# Patient Record
Sex: Female | Born: 1935 | Race: White | Hispanic: No | Marital: Married | Smoking: Never smoker
Health system: Southern US, Community
[De-identification: ages and names within clinical notes are randomized; demographics above are authoritative.]

## PROBLEM LIST (undated history)

## (undated) DIAGNOSIS — R42 Dizziness and giddiness: Secondary | ICD-10-CM

## (undated) DIAGNOSIS — I951 Orthostatic hypotension: Secondary | ICD-10-CM

## (undated) DIAGNOSIS — E872 Acidosis, unspecified: Secondary | ICD-10-CM

## (undated) DIAGNOSIS — M1990 Unspecified osteoarthritis, unspecified site: Secondary | ICD-10-CM

## (undated) DIAGNOSIS — N189 Chronic kidney disease, unspecified: Secondary | ICD-10-CM

## (undated) DIAGNOSIS — N9489 Other specified conditions associated with female genital organs and menstrual cycle: Secondary | ICD-10-CM

## (undated) DIAGNOSIS — M810 Age-related osteoporosis without current pathological fracture: Secondary | ICD-10-CM

## (undated) DIAGNOSIS — R001 Bradycardia, unspecified: Secondary | ICD-10-CM

## (undated) DIAGNOSIS — D649 Anemia, unspecified: Secondary | ICD-10-CM

## (undated) DIAGNOSIS — J302 Other seasonal allergic rhinitis: Secondary | ICD-10-CM

## (undated) DIAGNOSIS — H3530 Unspecified macular degeneration: Secondary | ICD-10-CM

## (undated) HISTORY — PX: ABDOMINAL HYSTERECTOMY: SHX81

---

## 2004-06-23 ENCOUNTER — Ambulatory Visit: Payer: Self-pay | Admitting: Family Medicine

## 2005-07-08 ENCOUNTER — Ambulatory Visit: Payer: Self-pay | Admitting: Family Medicine

## 2006-08-02 ENCOUNTER — Ambulatory Visit: Payer: Self-pay | Admitting: Family Medicine

## 2007-10-23 ENCOUNTER — Ambulatory Visit: Payer: Self-pay | Admitting: Family Medicine

## 2008-10-31 ENCOUNTER — Ambulatory Visit: Payer: Self-pay | Admitting: Family Medicine

## 2009-11-19 ENCOUNTER — Ambulatory Visit: Payer: Self-pay | Admitting: Family Medicine

## 2012-03-30 ENCOUNTER — Ambulatory Visit: Payer: Self-pay | Admitting: Family Medicine

## 2013-04-18 ENCOUNTER — Emergency Department: Payer: Self-pay | Admitting: Emergency Medicine

## 2014-07-15 ENCOUNTER — Ambulatory Visit: Payer: Self-pay | Admitting: Family Medicine

## 2015-06-20 ENCOUNTER — Encounter: Payer: Self-pay | Admitting: *Deleted

## 2015-06-24 NOTE — Discharge Instructions (Signed)

## 2015-06-25 ENCOUNTER — Encounter
Admission: RE | Disposition: A | Discharge status: Home / Self Care | Payer: Self-pay | Source: Ambulatory Visit | Attending: Ophthalmology

## 2015-06-25 ENCOUNTER — Ambulatory Visit: Payer: Medicare Other | Admitting: Anesthesiology

## 2015-06-25 ENCOUNTER — Ambulatory Visit
Admission: RE | Admit: 2015-06-25 | Discharge: 2015-06-25 | Disposition: A | Discharge status: Home / Self Care | Payer: Medicare Other | Source: Ambulatory Visit | Attending: Ophthalmology | Admitting: Ophthalmology

## 2015-06-25 DIAGNOSIS — J45909 Unspecified asthma, uncomplicated: Secondary | ICD-10-CM | POA: Insufficient documentation

## 2015-06-25 DIAGNOSIS — M810 Age-related osteoporosis without current pathological fracture: Secondary | ICD-10-CM | POA: Diagnosis not present

## 2015-06-25 DIAGNOSIS — H2512 Age-related nuclear cataract, left eye: Secondary | ICD-10-CM | POA: Insufficient documentation

## 2015-06-25 DIAGNOSIS — Z90710 Acquired absence of both cervix and uterus: Secondary | ICD-10-CM | POA: Insufficient documentation

## 2015-06-25 DIAGNOSIS — M1990 Unspecified osteoarthritis, unspecified site: Secondary | ICD-10-CM | POA: Insufficient documentation

## 2015-06-25 DIAGNOSIS — Z7982 Long term (current) use of aspirin: Secondary | ICD-10-CM | POA: Insufficient documentation

## 2015-06-25 DIAGNOSIS — H269 Unspecified cataract: Secondary | ICD-10-CM | POA: Diagnosis present

## 2015-06-25 HISTORY — DX: Other seasonal allergic rhinitis: J30.2

## 2015-06-25 HISTORY — DX: Age-related osteoporosis without current pathological fracture: M81.0

## 2015-06-25 HISTORY — DX: Unspecified osteoarthritis, unspecified site: M19.90

## 2015-06-25 HISTORY — PX: CATARACT EXTRACTION W/PHACO: SHX586

## 2015-06-25 HISTORY — DX: Dizziness and giddiness: R42

## 2015-06-25 SURGERY — PHACOEMULSIFICATION, CATARACT, WITH IOL INSERTION
Anesthesia: Monitor Anesthesia Care | Site: Eye | Laterality: Left | Wound class: Clean

## 2015-06-25 MED ORDER — ACETAMINOPHEN 325 MG PO TABS: 325.0000 mg | ORAL_TABLET | ORAL | Status: DC | PRN

## 2015-06-25 MED ORDER — MIDAZOLAM HCL 2 MG/2ML IJ SOLN
INTRAMUSCULAR | Status: DC | PRN
  Administered 2015-06-25: 2 mg via INTRAVENOUS

## 2015-06-25 MED ORDER — LACTATED RINGERS IV SOLN: INTRAVENOUS | Status: DC

## 2015-06-25 MED ORDER — TIMOLOL MALEATE 0.5 % OP SOLN
OPHTHALMIC | Status: DC | PRN
  Administered 2015-06-25: 1 [drp] via OPHTHALMIC

## 2015-06-25 MED ORDER — BRIMONIDINE TARTRATE 0.2 % OP SOLN
OPHTHALMIC | Status: DC | PRN
  Administered 2015-06-25: 1 [drp] via OPHTHALMIC

## 2015-06-25 MED ORDER — EPINEPHRINE HCL 1 MG/ML IJ SOLN
INTRAMUSCULAR | Status: DC | PRN
  Administered 2015-06-25: 85 mL via OPHTHALMIC

## 2015-06-25 MED ORDER — NA HYALUR & NA CHOND-NA HYALUR 0.4-0.35 ML IO KIT
PACK | INTRAOCULAR | Status: DC | PRN
  Administered 2015-06-25: 1 mL via INTRAOCULAR

## 2015-06-25 MED ORDER — ARMC OPHTHALMIC DILATING GEL
1.0000 "application " | OPHTHALMIC | Status: DC | PRN
  Administered 2015-06-25 (×2): 1 via OPHTHALMIC

## 2015-06-25 MED ORDER — FENTANYL CITRATE (PF) 100 MCG/2ML IJ SOLN
INTRAMUSCULAR | Status: DC | PRN
  Administered 2015-06-25: 50 ug via INTRAVENOUS

## 2015-06-25 MED ORDER — ACETAMINOPHEN 160 MG/5ML PO SOLN: 325.0000 mg | ORAL | Status: DC | PRN

## 2015-06-25 MED ORDER — POVIDONE-IODINE 5 % OP SOLN
1.0000 "application " | OPHTHALMIC | Status: DC | PRN
  Administered 2015-06-25: 1 via OPHTHALMIC

## 2015-06-25 MED ORDER — TETRACAINE HCL 0.5 % OP SOLN
1.0000 [drp] | OPHTHALMIC | Status: DC | PRN
  Administered 2015-06-25: 1 [drp] via OPHTHALMIC

## 2015-06-25 MED ORDER — CEFUROXIME OPHTHALMIC INJECTION 1 MG/0.1 ML
INJECTION | OPHTHALMIC | Status: DC | PRN
  Administered 2015-06-25: 0.1 mL via INTRACAMERAL

## 2015-06-25 SURGICAL SUPPLY — 27 items
CANNULA ANT/CHMB 27GA (MISCELLANEOUS) ×3 IMPLANT
GLOVE SURG LX 7.5 STRW (GLOVE) ×2
GLOVE SURG LX STRL 7.5 STRW (GLOVE) ×1 IMPLANT
GLOVE SURG TRIUMPH 8.0 PF LTX (GLOVE) ×3 IMPLANT
GOWN STRL REUS W/ TWL LRG LVL3 (GOWN DISPOSABLE) ×2 IMPLANT
GOWN STRL REUS W/TWL LRG LVL3 (GOWN DISPOSABLE) ×4
LENS IOL TECNIS 21 (Intraocular Lens) ×1 IMPLANT
LENS IOL TECNIS 21.0 (Intraocular Lens) ×2 IMPLANT
LENS IOL TECNIS MONO 1P 21.0 (Intraocular Lens) ×1 IMPLANT
MARKER SKIN SURG W/RULER VIO (MISCELLANEOUS) ×3 IMPLANT
NDL RETROBULBAR .5 NSTRL (NEEDLE) IMPLANT
NEEDLE FILTER BLUNT 18X 1/2SAF (NEEDLE) ×4
NEEDLE FILTER BLUNT 18X1 1/2 (NEEDLE) ×2 IMPLANT
PACK CATARACT BRASINGTON (MISCELLANEOUS) ×3 IMPLANT
PACK EYE AFTER SURG (MISCELLANEOUS) ×3 IMPLANT
PACK OPTHALMIC (MISCELLANEOUS) ×3 IMPLANT
RING MALYGIN 7.0 (MISCELLANEOUS) IMPLANT
SUT ETHILON 10-0 CS-B-6CS-B-6 (SUTURE)
SUT VICRYL  9 0 (SUTURE)
SUT VICRYL 9 0 (SUTURE) IMPLANT
SUTURE EHLN 10-0 CS-B-6CS-B-6 (SUTURE) IMPLANT
SYR 3ML LL SCALE MARK (SYRINGE) ×6 IMPLANT
SYR 5ML LL (SYRINGE) IMPLANT
SYR TB 1ML LUER SLIP (SYRINGE) ×3 IMPLANT
WATER STERILE IRR 250ML POUR (IV SOLUTION) ×3 IMPLANT
WATER STERILE IRR 500ML POUR (IV SOLUTION) IMPLANT
WIPE NON LINTING 3.25X3.25 (MISCELLANEOUS) ×3 IMPLANT

## 2015-06-25 NOTE — Transfer of Care (Signed)
Immediate Anesthesia Transfer of Care Note  Patient: Cassie Solis  Procedure(s) Performed: Procedure(s) with comments: CATARACT EXTRACTION PHACO AND INTRAOCULAR LENS PLACEMENT (IOC) (Left) - PT PREFERS EARLY MORNING PER DR OFFICE  Patient Location: PACU  Anesthesia Type: MAC  Level of Consciousness: awake, alert  and patient cooperative  Airway and Oxygen Therapy: Patient Spontanous Breathing and Patient connected to supplemental oxygen  Post-op Assessment: Post-op Vital signs reviewed, Patient's Cardiovascular Status Stable, Respiratory Function Stable, Patent Airway and No signs of Nausea or vomiting  Post-op Vital Signs: Reviewed and stable  Complications: No apparent anesthesia complications

## 2015-06-25 NOTE — Anesthesia Postprocedure Evaluation (Signed)
  Anesthesia Post-op Note  Patient: Cassie GarnerSadie L Solis  Procedure(s) Performed: Procedure(s) with comments: CATARACT EXTRACTION PHACO AND INTRAOCULAR LENS PLACEMENT (IOC) (Left) - PT PREFERS EARLY MORNING PER DR OFFICE  Anesthesia type:MAC  Patient location: PACU  Post pain: Pain level controlled  Post assessment: Post-op Vital signs reviewed, Patient's Cardiovascular Status Stable, Respiratory Function Stable, Patent Airway and No signs of Nausea or vomiting  Post vital signs: Reviewed and stable  Last Vitals:  Filed Vitals:   06/25/15 0945  BP: 140/77  Pulse: 61  Temp:   Resp: 15    Level of consciousness: awake, alert  and patient cooperative  Complications: No apparent anesthesia complications

## 2015-06-25 NOTE — Op Note (Signed)
OPERATIVE NOTE  Jhonnie GarnerSadie L Negron [id number] 06/25/2015   PREOPERATIVE DIAGNOSIS:  Nuclear sclerotic cataract left eye. H25.12   POSTOPERATIVE DIAGNOSIS:    Nuclear sclerotic cataract left eye.     PROCEDURE:  Phacoemusification with posterior chamber intraocular lens placement of the left eye   LENS:   Implant Name Type Inv. Item Serial No. Manufacturer Lot No. LRB No. Used  LENS IMPL INTRAOC ZCB00 21.0 - [id number] Intraocular Lens LENS IMPL INTRAOC ZCB00 21.0 [id number]   Left 1        ULTRASOUND TIME: 13.5  % of 1 minutes 7 seconds, CDE 9.1  SURGEON:  Deirdre Evenerhadwick R. Emanuele Mcwhirter, MD   ANESTHESIA:  Topical with tetracaine drops and 2% Xylocaine jelly.   COMPLICATIONS:  None.   DESCRIPTION OF PROCEDURE:  The patient was identified in the holding room and transported to the operating room and placed in the supine position under the operating microscope.  The left eye was identified as the operative eye and it was prepped and draped in the usual sterile ophthalmic fashion.   A 1 millimeter clear-corneal paracentesis was made at the 1:30 position.  The anterior chamber was filled with Viscoat viscoelastic.  A 2.4 millimeter keratome was used to make a near-clear corneal incision at the 10:30 position.  .  A curvilinear capsulorrhexis was made with a cystotome and capsulorrhexis forceps.  Balanced salt solution was used to hydrodissect and hydrodelineate the nucleus.   Phacoemulsification was then used in stop and chop fashion to remove the lens nucleus and epinucleus.  The remaining cortex was then removed using the irrigation and aspiration handpiece. Provisc was then placed into the capsular bag to distend it for lens placement.  A lens was then injected into the capsular bag.  The remaining viscoelastic was aspirated.   Wounds were hydrated with balanced salt solution.  The anterior chamber was inflated to a physiologic pressure with balanced salt solution.  No wound leaks were noted.  Cefuroxime 0.1 ml of a 10mg /ml solution was injected into the anterior chamber for a dose of 1 mg of intracameral antibiotic at the completion of the case.   Timolol and Brimonidine drops were applied to the eye.  The patient was taken to the recovery room in stable condition without complications of anesthesia or surgery.  Sylwia Cuervo 06/25/2015, 9:38 AM

## 2015-06-25 NOTE — Anesthesia Preprocedure Evaluation (Signed)
Anesthesia Evaluation  Patient identified by MRN, date of birth, ID band  Reviewed: Allergy & Precautions, H&P , NPO status , Patient's Chart, lab work & pertinent test results  Airway Mallampati: II  TM Distance: >3 FB Neck ROM: full    Dental no notable dental hx.    Pulmonary    Pulmonary exam normal       Cardiovascular Rhythm:regular Rate:Normal     Neuro/Psych    GI/Hepatic   Endo/Other    Renal/GU      Musculoskeletal   Abdominal   Peds  Hematology   Anesthesia Other Findings   Reproductive/Obstetrics                             Anesthesia Physical Anesthesia Plan  ASA: II  Anesthesia Plan: MAC   Post-op Pain Management:    Induction:   Airway Management Planned:   Additional Equipment:   Intra-op Plan:   Post-operative Plan:   Informed Consent: I have reviewed the patients History and Physical, chart, labs and discussed the procedure including the risks, benefits and alternatives for the proposed anesthesia with the patient or authorized representative who has indicated his/her understanding and acceptance.     Plan Discussed with: CRNA  Anesthesia Plan Comments:         Anesthesia Quick Evaluation  

## 2015-06-25 NOTE — Anesthesia Procedure Notes (Signed)
Procedure Name: MAC Performed by: Shakeisha Horine Pre-anesthesia Checklist: Patient identified, Emergency Drugs available, Suction available, Timeout performed and Patient being monitored Patient Re-evaluated:Patient Re-evaluated prior to inductionOxygen Delivery Method: Nasal cannula Placement Confirmation: positive ETCO2     

## 2015-06-25 NOTE — H&P (Signed)
  The History and Physical notes are on paper, have been signed, and are to be scanned. The patient remains stable and unchanged from the H&P.   Previous H&P reviewed, patient examined, and there are no changes.  Usman Millett 06/25/2015 9:10 AM

## 2015-06-26 ENCOUNTER — Encounter: Payer: Self-pay | Admitting: Ophthalmology

## 2015-09-03 ENCOUNTER — Encounter: Payer: Self-pay | Admitting: *Deleted

## 2015-09-09 NOTE — Discharge Instructions (Signed)

## 2015-09-10 ENCOUNTER — Ambulatory Visit: Payer: Medicare Other | Admitting: Student in an Organized Health Care Education/Training Program

## 2015-09-10 ENCOUNTER — Ambulatory Visit
Admission: RE | Admit: 2015-09-10 | Discharge: 2015-09-10 | Disposition: A | Discharge status: Home / Self Care | Payer: Medicare Other | Source: Ambulatory Visit | Attending: Ophthalmology | Admitting: Ophthalmology

## 2015-09-10 ENCOUNTER — Encounter
Admission: RE | Disposition: A | Discharge status: Home / Self Care | Payer: Self-pay | Source: Ambulatory Visit | Attending: Ophthalmology

## 2015-09-10 DIAGNOSIS — J45909 Unspecified asthma, uncomplicated: Secondary | ICD-10-CM | POA: Diagnosis not present

## 2015-09-10 DIAGNOSIS — Z7982 Long term (current) use of aspirin: Secondary | ICD-10-CM | POA: Insufficient documentation

## 2015-09-10 DIAGNOSIS — Z79899 Other long term (current) drug therapy: Secondary | ICD-10-CM | POA: Diagnosis not present

## 2015-09-10 DIAGNOSIS — Z90710 Acquired absence of both cervix and uterus: Secondary | ICD-10-CM | POA: Diagnosis not present

## 2015-09-10 DIAGNOSIS — H2511 Age-related nuclear cataract, right eye: Secondary | ICD-10-CM | POA: Insufficient documentation

## 2015-09-10 DIAGNOSIS — M1991 Primary osteoarthritis, unspecified site: Secondary | ICD-10-CM | POA: Insufficient documentation

## 2015-09-10 DIAGNOSIS — M810 Age-related osteoporosis without current pathological fracture: Secondary | ICD-10-CM | POA: Diagnosis not present

## 2015-09-10 DIAGNOSIS — H269 Unspecified cataract: Secondary | ICD-10-CM | POA: Diagnosis present

## 2015-09-10 HISTORY — PX: CATARACT EXTRACTION W/PHACO: SHX586

## 2015-09-10 SURGERY — PHACOEMULSIFICATION, CATARACT, WITH IOL INSERTION
Anesthesia: Monitor Anesthesia Care | Laterality: Right | Wound class: Clean

## 2015-09-10 MED ORDER — ARMC OPHTHALMIC DILATING GEL
1.0000 "application " | OPHTHALMIC | Status: DC | PRN
  Administered 2015-09-10 (×2): 1 via OPHTHALMIC

## 2015-09-10 MED ORDER — ACETAMINOPHEN 325 MG PO TABS: 325.0000 mg | ORAL_TABLET | ORAL | Status: DC | PRN

## 2015-09-10 MED ORDER — EPINEPHRINE HCL 1 MG/ML IJ SOLN
INTRAOCULAR | Status: DC | PRN
  Administered 2015-09-10: 66 mL via OPHTHALMIC

## 2015-09-10 MED ORDER — NA HYALUR & NA CHOND-NA HYALUR 0.4-0.35 ML IO KIT
PACK | INTRAOCULAR | Status: DC | PRN
Start: 2015-09-10 — End: 2015-09-10
  Administered 2015-09-10: 1 mL via INTRAOCULAR

## 2015-09-10 MED ORDER — CEFUROXIME OPHTHALMIC INJECTION 1 MG/0.1 ML
INJECTION | OPHTHALMIC | Status: DC | PRN
  Administered 2015-09-10: 0.1 mL via OPHTHALMIC

## 2015-09-10 MED ORDER — TETRACAINE HCL 0.5 % OP SOLN
1.0000 [drp] | OPHTHALMIC | Status: DC | PRN
  Administered 2015-09-10: 1 [drp] via OPHTHALMIC

## 2015-09-10 MED ORDER — TIMOLOL MALEATE 0.5 % OP SOLN
OPHTHALMIC | Status: DC | PRN
  Administered 2015-09-10: 1 [drp] via OPHTHALMIC

## 2015-09-10 MED ORDER — ACETAMINOPHEN 160 MG/5ML PO SOLN: 325.0000 mg | ORAL | Status: DC | PRN

## 2015-09-10 MED ORDER — BRIMONIDINE TARTRATE 0.2 % OP SOLN
OPHTHALMIC | Status: DC | PRN
  Administered 2015-09-10: 1 [drp] via OPHTHALMIC

## 2015-09-10 MED ORDER — POVIDONE-IODINE 5 % OP SOLN
1.0000 "application " | OPHTHALMIC | Status: DC | PRN
  Administered 2015-09-10: 1 via OPHTHALMIC

## 2015-09-10 MED ORDER — MIDAZOLAM HCL 2 MG/2ML IJ SOLN
INTRAMUSCULAR | Status: DC | PRN
  Administered 2015-09-10: 1 mg via INTRAVENOUS

## 2015-09-10 MED ORDER — ONDANSETRON HCL 4 MG/2ML IJ SOLN: 4.0000 mg | Freq: Once | INTRAMUSCULAR | Status: DC | PRN

## 2015-09-10 MED ORDER — FENTANYL CITRATE (PF) 100 MCG/2ML IJ SOLN
INTRAMUSCULAR | Status: DC | PRN
  Administered 2015-09-10: 50 ug via INTRAVENOUS

## 2015-09-10 SURGICAL SUPPLY — 28 items
CANNULA ANT/CHMB 27GA (MISCELLANEOUS) ×3 IMPLANT
CARTRIDGE ABBOTT (MISCELLANEOUS) ×3 IMPLANT
GLOVE SURG LX 7.5 STRW (GLOVE) ×2
GLOVE SURG LX STRL 7.5 STRW (GLOVE) ×1 IMPLANT
GLOVE SURG TRIUMPH 8.0 PF LTX (GLOVE) ×3 IMPLANT
GOWN STRL REUS W/ TWL LRG LVL3 (GOWN DISPOSABLE) ×2 IMPLANT
GOWN STRL REUS W/TWL LRG LVL3 (GOWN DISPOSABLE) ×4
LENS IOL TECNIS 21 (Intraocular Lens) ×1 IMPLANT
LENS IOL TECNIS 21.0 (Intraocular Lens) ×2 IMPLANT
LENS IOL TECNIS MONO 1P 21.0 (Intraocular Lens) ×1 IMPLANT
MARKER SKIN SURG W/RULER VIO (MISCELLANEOUS) ×3 IMPLANT
NDL RETROBULBAR .5 NSTRL (NEEDLE) IMPLANT
NEEDLE FILTER BLUNT 18X 1/2SAF (NEEDLE) ×2
NEEDLE FILTER BLUNT 18X1 1/2 (NEEDLE) ×1 IMPLANT
PACK CATARACT BRASINGTON (MISCELLANEOUS) ×3 IMPLANT
PACK EYE AFTER SURG (MISCELLANEOUS) ×3 IMPLANT
PACK OPTHALMIC (MISCELLANEOUS) ×3 IMPLANT
RING MALYGIN 7.0 (MISCELLANEOUS) IMPLANT
SUT ETHILON 10-0 CS-B-6CS-B-6 (SUTURE)
SUT VICRYL  9 0 (SUTURE)
SUT VICRYL 9 0 (SUTURE) IMPLANT
SUTURE EHLN 10-0 CS-B-6CS-B-6 (SUTURE) IMPLANT
SYR 3ML LL SCALE MARK (SYRINGE) ×3 IMPLANT
SYR 5ML LL (SYRINGE) IMPLANT
SYR TB 1ML LUER SLIP (SYRINGE) ×3 IMPLANT
WATER STERILE IRR 250ML POUR (IV SOLUTION) ×3 IMPLANT
WATER STERILE IRR 500ML POUR (IV SOLUTION) IMPLANT
WIPE NON LINTING 3.25X3.25 (MISCELLANEOUS) ×3 IMPLANT

## 2015-09-10 NOTE — Transfer of Care (Signed)
Immediate Anesthesia Transfer of Care Note  Patient: Cassie GarnerSadie L Haecker  Procedure(s) Performed: Procedure(s) with comments: CATARACT EXTRACTION PHACO AND INTRAOCULAR LENS PLACEMENT (IOC) (Right) - pt prefers early AM  Patient Location: PACU  Anesthesia Type: MAC  Level of Consciousness: awake, alert  and patient cooperative  Airway and Oxygen Therapy: Patient Spontanous Breathing and Patient connected to supplemental oxygen  Post-op Assessment: Post-op Vital signs reviewed, Patient's Cardiovascular Status Stable, Respiratory Function Stable, Patent Airway and No signs of Nausea or vomiting  Post-op Vital Signs: Reviewed and stable  Complications: No apparent anesthesia complications

## 2015-09-10 NOTE — Anesthesia Preprocedure Evaluation (Signed)
Anesthesia Evaluation  Patient identified by MRN, date of birth, ID band  Reviewed: Allergy & Precautions, H&P , NPO status , Patient's Chart, lab work & pertinent test results  Airway Mallampati: II  TM Distance: >3 FB Neck ROM: full    Dental no notable dental hx.    Pulmonary    Pulmonary exam normal       Cardiovascular Rhythm:regular Rate:Normal     Neuro/Psych    GI/Hepatic   Endo/Other    Renal/GU      Musculoskeletal   Abdominal   Peds  Hematology   Anesthesia Other Findings   Reproductive/Obstetrics                             Anesthesia Physical Anesthesia Plan  ASA: II  Anesthesia Plan: MAC   Post-op Pain Management:    Induction:   Airway Management Planned:   Additional Equipment:   Intra-op Plan:   Post-operative Plan:   Informed Consent: I have reviewed the patients History and Physical, chart, labs and discussed the procedure including the risks, benefits and alternatives for the proposed anesthesia with the patient or authorized representative who has indicated his/her understanding and acceptance.     Plan Discussed with: CRNA  Anesthesia Plan Comments:         Anesthesia Quick Evaluation  

## 2015-09-10 NOTE — Anesthesia Procedure Notes (Signed)
Procedure Name: MAC Performed by: Camiyah Friberg Pre-anesthesia Checklist: Patient identified, Emergency Drugs available, Suction available, Patient being monitored and Timeout performed Patient Re-evaluated:Patient Re-evaluated prior to inductionOxygen Delivery Method: Nasal cannula       

## 2015-09-10 NOTE — Anesthesia Postprocedure Evaluation (Signed)
Anesthesia Post Note  Patient: Cassie GarnerSadie L Mcgregor  Procedure(s) Performed: Procedure(s) (LRB): CATARACT EXTRACTION PHACO AND INTRAOCULAR LENS PLACEMENT (IOC) (Right)  Patient location during evaluation: PACU Anesthesia Type: MAC Level of consciousness: awake and alert Pain management: pain level controlled Vital Signs Assessment: post-procedure vital signs reviewed and stable Respiratory status: spontaneous breathing, nonlabored ventilation, respiratory function stable and patient connected to nasal cannula oxygen Cardiovascular status: stable and blood pressure returned to baseline Anesthetic complications: no    Durene Fruitshomas,  Eldo Umanzor G

## 2015-09-10 NOTE — Op Note (Signed)
LOCATION:  [facility]   PREOPERATIVE DIAGNOSIS:    Nuclear sclerotic cataract right eye. H25.11   POSTOPERATIVE DIAGNOSIS:  Nuclear sclerotic cataract right eye.     PROCEDURE:  Phacoemusification with posterior chamber intraocular lens placement of the right eye   LENS:   Implant Name Type Inv. Item Serial No. Manufacturer Lot No. LRB No. Used  LENS IMPL INTRAOC ZCB00 21.0 - [id number] Intraocular Lens LENS IMPL INTRAOC ZCB00 21.0 [id number]   Right 1        ULTRASOUND TIME: 13 % of 1 minutes, 17 seconds.  CDE 10.3   SURGEON:  Deirdre Evenerhadwick R. Keyonte Cookston, MD   ANESTHESIA:  Topical with tetracaine drops and 2% Xylocaine jelly.   COMPLICATIONS:  None.   DESCRIPTION OF PROCEDURE:  The patient was identified in the holding room and transported to the operating room and placed in the supine position under the operating microscope.  The right eye was identified as the operative eye and it was prepped and draped in the usual sterile ophthalmic fashion.   A 1 millimeter clear-corneal paracentesis was made at the 12:00 position.  The anterior chamber was filled with Viscoat viscoelastic.  A 2.4 millimeter keratome was used to make a near-clear corneal incision at the 9:00 position.  A curvilinear capsulorrhexis was made with a cystotome and capsulorrhexis forceps.  Balanced salt solution was used to hydrodissect and hydrodelineate the nucleus.   Phacoemulsification was then used in stop and chop fashion to remove the lens nucleus and epinucleus.  The remaining cortex was then removed using the irrigation and aspiration handpiece. Provisc was then placed into the capsular bag to distend it for lens placement.  A lens was then injected into the capsular bag.  The remaining viscoelastic was aspirated.   Wounds were hydrated with balanced salt solution.  The anterior chamber was inflated to a physiologic pressure with balanced salt solution.  No wound leaks were noted. Cefuroxime 0.1 ml of  a 10mg /ml solution was injected into the anterior chamber for a dose of 1 mg of intracameral antibiotic at the completion of the case.   Timolol and Brimonidine drops were applied to the eye.  The patient was taken to the recovery room in stable condition without complications of anesthesia or surgery.   Cassie Solis 09/10/2015, 9:51 AM

## 2015-09-10 NOTE — H&P (Signed)
  The History and Physical notes are on paper, have been signed, and are to be scanned. The patient remains stable and unchanged from the H&P.   Previous H&P reviewed, patient examined, and there are no changes.  Meenakshi Sazama 09/10/2015 9:22 AM

## 2015-09-11 ENCOUNTER — Encounter: Payer: Self-pay | Admitting: Ophthalmology

## 2016-02-10 ENCOUNTER — Emergency Department
Admission: EM | Admit: 2016-02-10 | Discharge: 2016-02-10 | Disposition: A | Discharge status: Home / Self Care | Payer: Medicare Other | Attending: Emergency Medicine | Admitting: Emergency Medicine

## 2016-02-10 ENCOUNTER — Emergency Department: Payer: Medicare Other

## 2016-02-10 ENCOUNTER — Encounter: Payer: Self-pay | Admitting: Emergency Medicine

## 2016-02-10 DIAGNOSIS — Z7982 Long term (current) use of aspirin: Secondary | ICD-10-CM | POA: Insufficient documentation

## 2016-02-10 DIAGNOSIS — M1990 Unspecified osteoarthritis, unspecified site: Secondary | ICD-10-CM | POA: Diagnosis not present

## 2016-02-10 DIAGNOSIS — Y999 Unspecified external cause status: Secondary | ICD-10-CM | POA: Insufficient documentation

## 2016-02-10 DIAGNOSIS — Y939 Activity, unspecified: Secondary | ICD-10-CM | POA: Insufficient documentation

## 2016-02-10 DIAGNOSIS — Z79899 Other long term (current) drug therapy: Secondary | ICD-10-CM | POA: Diagnosis not present

## 2016-02-10 DIAGNOSIS — W19XXXA Unspecified fall, initial encounter: Secondary | ICD-10-CM | POA: Diagnosis not present

## 2016-02-10 DIAGNOSIS — Y929 Unspecified place or not applicable: Secondary | ICD-10-CM | POA: Diagnosis not present

## 2016-02-10 DIAGNOSIS — S2231XA Fracture of one rib, right side, initial encounter for closed fracture: Secondary | ICD-10-CM | POA: Insufficient documentation

## 2016-02-10 NOTE — ED Provider Notes (Signed)
[facility] Emergency Department Provider Note  ____________________________________________  Time seen: Approximately 1:03 PM  I have reviewed the triage vital signs and the nursing notes.   HISTORY  Chief Complaint Rib Fracture    HPI Cassie Solis is a 82 y.o. female presents for evaluation of right rib fractures. Patient states that she fell 3 days ago [facility] diagnosed with minimally displaced fracture of the eighth and ninth ribs. Questionable pleural effusion. Patient sent over here for evaluation for pleural effusion. Patient denies any chest pains or shortness of breath.   Past Medical History  Diagnosis Date  . Vertigo     rare  . Osteoporosis   . Arthritis     left wrist, bilateral hips  . Seasonal allergies     There are no active problems to display for this patient.   Past Surgical History  Procedure Laterality Date  . Abdominal hysterectomy    . Cataract extraction w/phaco Left 06/25/2015    Procedure: CATARACT EXTRACTION PHACO AND INTRAOCULAR LENS PLACEMENT (IOC);  Surgeon: Lockie Molahadwick Brasington, MD;  [facility];  Service: Ophthalmology;  Laterality: Left;  PT PREFERS EARLY MORNING PER DR OFFICE  . Cataract extraction w/phaco Right 09/10/2015    Procedure: CATARACT EXTRACTION PHACO AND INTRAOCULAR LENS PLACEMENT (IOC);  Surgeon: Lockie Molahadwick Brasington, MD;  [facility];  Service: Ophthalmology;  Laterality: Right;  pt prefers early AM    Current Outpatient Rx  Name  Route  Sig  Dispense  Refill  . aspirin 81 MG tablet   Oral   Take 81 mg by mouth daily. AM         . Calcium Citrate (CITRACAL PO)   Oral   Take by mouth daily. AM         . meclizine (ANTIVERT) 25 MG tablet   Oral   Take 25 mg by mouth as needed for dizziness.           Allergies Review of patient's allergies indicates no known allergies.  No family history on file.  Social History Social  History  Substance Use Topics  . Smoking status: Never Smoker   . Smokeless tobacco: None  . Alcohol Use: No    Review of Systems Constitutional: No fever/chills Cardiovascular: Denies chest pain. Respiratory: Denies shortness of breath. Gastrointestinal: No abdominal pain.  No nausea, no vomiting.  No diarrhea.  No constipation. Genitourinary: Negative for dysuria. Musculoskeletal: Positive for right rib fractures. Skin: Negative for rash. Neurological: Negative for headaches, focal weakness or numbness.  10-point ROS otherwise negative.  ____________________________________________   PHYSICAL EXAM:  VITAL SIGNS: ED Triage Vitals  Enc Vitals Group     BP 02/10/16 1248 184/73 mmHg     Pulse Rate 02/10/16 1248 54     Resp 02/10/16 1248 18     Temp 02/10/16 1248 98.1 F (36.7 C)     Temp Source 02/10/16 1248 Oral     SpO2 02/10/16 1248 99 %     Weight 02/10/16 1248 120 lb (54.432 kg)     Height 02/10/16 1248 5\' 2"  (1.575 m)     Head Cir --      Peak Flow --      Pain Score 02/10/16 1248 2     Pain Loc --      Pain Edu? --      Excl. in GC? --     Constitutional: Alert and oriented. Well appearing and in no  acute distress. Neck: No stridor.   Cardiovascular: Normal rate, regular rhythm. Grossly normal heart sounds.  Good peripheral circulation. Respiratory: Normal respiratory effort.  No retractions. Lungs CTAB. Gastrointestinal: Soft and nontender. No distention. No CVA tenderness. Musculoskeletal: No lower extremity tenderness nor edema.  No joint effusions. Neurologic:  Normal speech and language. No gross focal neurologic deficits are appreciated. No gait instability. Skin:  Skin is warm, dry and intact. No rash noted. Psychiatric: Mood and affect are normal. Speech and behavior are normal.  ____________________________________________   LABS (all labs ordered are listed, but only abnormal results are displayed)  Labs Reviewed - No data to  display ____________________________________________  EKG   ____________________________________________  RADIOLOGY  IMPRESSION: 1. Subtle nondisplaced fracture of the right lateral eighth rib with possible additional nondisplaced fractures of the right lateral ninth and tenth ribs. 2. No evidence of a fracture complication. 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 No evidence of a lung contusion or laceration. No pneumothorax. 3. 3.5 mm right middle lobe lung nodule. No follow-up needed if patient is low-risk. Non-contrast chest CT can be considered in 12 months if patient is high-risk. This recommendation follows the consensus statement: Guidelines for Management of Incidental Pulmonary Nodules Detected on CT Images:From the Fleischner Society 2017; published online before print (10.1148/[id number]). ____________________________________________   PROCEDURES  Procedure(s) performed: None  Critical Care performed: No  ____________________________________________   INITIAL IMPRESSION / ASSESSMENT AND PLAN / ED COURSE  Pertinent labs & imaging results that were available during my care of the patient were reviewed by me and considered in my medical decision making (see chart for details).  Status post fall with nondisplaced fracture of the right lateral eighth rib possible ninth as well. No pleural effusion or atelectasis. Patient discharged home to follow up with PCP as needed. ____________________________________________   FINAL CLINICAL IMPRESSION(S) / ED DIAGNOSES  Final diagnoses:  Rib fractures, right, closed, initial encounter     This chart was dictated using voice recognition software/Dragon. Despite best efforts to proofread, errors can occur which can change the meaning. Any change was purely unintentional.   Evangeline Dakin, PA-C 02/10/16  1353  Rockne Menghini, MD 02/10/16 1425

## 2016-02-10 NOTE — ED Notes (Signed)
Patient fell on Saturday , and injured right ribs.  [facility] and diagnosed with "minimaly displaced fractures of the eighth and ninth ribs with question of right pleural effusion vs. Atelectasis".  Sent to ED for evaluation.

## 2016-02-10 NOTE — Discharge Instructions (Signed)

## 2016-10-18 DIAGNOSIS — H353132 Nonexudative age-related macular degeneration, bilateral, intermediate dry stage: Secondary | ICD-10-CM | POA: Diagnosis not present

## 2017-04-18 DIAGNOSIS — H353132 Nonexudative age-related macular degeneration, bilateral, intermediate dry stage: Secondary | ICD-10-CM | POA: Diagnosis not present

## 2017-06-16 DIAGNOSIS — M810 Age-related osteoporosis without current pathological fracture: Secondary | ICD-10-CM | POA: Diagnosis not present

## 2017-06-16 DIAGNOSIS — N183 Chronic kidney disease, stage 3 (moderate): Secondary | ICD-10-CM | POA: Diagnosis not present

## 2017-06-16 DIAGNOSIS — Z23 Encounter for immunization: Secondary | ICD-10-CM | POA: Diagnosis not present

## 2017-06-16 DIAGNOSIS — Z6822 Body mass index (BMI) 22.0-22.9, adult: Secondary | ICD-10-CM | POA: Diagnosis not present

## 2017-09-16 DIAGNOSIS — Z6822 Body mass index (BMI) 22.0-22.9, adult: Secondary | ICD-10-CM | POA: Diagnosis not present

## 2017-09-16 DIAGNOSIS — Z23 Encounter for immunization: Secondary | ICD-10-CM | POA: Diagnosis not present

## 2017-09-16 DIAGNOSIS — N183 Chronic kidney disease, stage 3 (moderate): Secondary | ICD-10-CM | POA: Diagnosis not present

## 2017-09-16 DIAGNOSIS — M810 Age-related osteoporosis without current pathological fracture: Secondary | ICD-10-CM | POA: Diagnosis not present

## 2017-10-12 DIAGNOSIS — Z1389 Encounter for screening for other disorder: Secondary | ICD-10-CM | POA: Diagnosis not present

## 2017-10-12 DIAGNOSIS — K5790 Diverticulosis of intestine, part unspecified, without perforation or abscess without bleeding: Secondary | ICD-10-CM | POA: Diagnosis not present

## 2017-10-17 DIAGNOSIS — H353132 Nonexudative age-related macular degeneration, bilateral, intermediate dry stage: Secondary | ICD-10-CM | POA: Diagnosis not present

## 2018-03-16 DIAGNOSIS — N183 Chronic kidney disease, stage 3 (moderate): Secondary | ICD-10-CM | POA: Diagnosis not present

## 2018-03-16 DIAGNOSIS — D7589 Other specified diseases of blood and blood-forming organs: Secondary | ICD-10-CM | POA: Diagnosis not present

## 2018-03-16 DIAGNOSIS — Z1339 Encounter for screening examination for other mental health and behavioral disorders: Secondary | ICD-10-CM | POA: Diagnosis not present

## 2018-04-04 DIAGNOSIS — M545 Low back pain: Secondary | ICD-10-CM | POA: Diagnosis not present

## 2018-04-06 DIAGNOSIS — M545 Low back pain: Secondary | ICD-10-CM | POA: Diagnosis not present

## 2018-04-06 DIAGNOSIS — M47815 Spondylosis without myelopathy or radiculopathy, thoracolumbar region: Secondary | ICD-10-CM | POA: Diagnosis not present

## 2018-04-18 DIAGNOSIS — H353132 Nonexudative age-related macular degeneration, bilateral, intermediate dry stage: Secondary | ICD-10-CM | POA: Diagnosis not present

## 2018-08-07 DIAGNOSIS — Z23 Encounter for immunization: Secondary | ICD-10-CM | POA: Diagnosis not present

## 2018-09-15 DIAGNOSIS — M810 Age-related osteoporosis without current pathological fracture: Secondary | ICD-10-CM | POA: Diagnosis not present

## 2019-02-20 DIAGNOSIS — H353133 Nonexudative age-related macular degeneration, bilateral, advanced atrophic without subfoveal involvement: Secondary | ICD-10-CM | POA: Diagnosis not present

## 2019-03-13 DIAGNOSIS — M810 Age-related osteoporosis without current pathological fracture: Secondary | ICD-10-CM | POA: Diagnosis not present

## 2019-03-13 DIAGNOSIS — Z9181 History of falling: Secondary | ICD-10-CM | POA: Diagnosis not present

## 2019-03-13 DIAGNOSIS — Z139 Encounter for screening, unspecified: Secondary | ICD-10-CM | POA: Diagnosis not present

## 2019-03-13 DIAGNOSIS — N183 Chronic kidney disease, stage 3 (moderate): Secondary | ICD-10-CM | POA: Diagnosis not present

## 2019-06-20 DIAGNOSIS — N183 Chronic kidney disease, stage 3 unspecified: Secondary | ICD-10-CM | POA: Diagnosis not present

## 2019-06-20 DIAGNOSIS — M810 Age-related osteoporosis without current pathological fracture: Secondary | ICD-10-CM | POA: Diagnosis not present

## 2019-06-20 DIAGNOSIS — Z23 Encounter for immunization: Secondary | ICD-10-CM | POA: Diagnosis not present

## 2019-06-20 DIAGNOSIS — Z6821 Body mass index (BMI) 21.0-21.9, adult: Secondary | ICD-10-CM | POA: Diagnosis not present

## 2019-06-20 DIAGNOSIS — J309 Allergic rhinitis, unspecified: Secondary | ICD-10-CM | POA: Diagnosis not present

## 2019-06-20 DIAGNOSIS — D7589 Other specified diseases of blood and blood-forming organs: Secondary | ICD-10-CM | POA: Diagnosis not present

## 2019-06-20 DIAGNOSIS — Z1331 Encounter for screening for depression: Secondary | ICD-10-CM | POA: Diagnosis not present

## 2019-08-21 DIAGNOSIS — H353134 Nonexudative age-related macular degeneration, bilateral, advanced atrophic with subfoveal involvement: Secondary | ICD-10-CM | POA: Diagnosis not present

## 2019-09-17 DIAGNOSIS — Z0000 Encounter for general adult medical examination without abnormal findings: Secondary | ICD-10-CM | POA: Diagnosis not present

## 2019-09-17 DIAGNOSIS — Z9181 History of falling: Secondary | ICD-10-CM | POA: Diagnosis not present

## 2019-09-17 DIAGNOSIS — Z1331 Encounter for screening for depression: Secondary | ICD-10-CM | POA: Diagnosis not present

## 2019-12-03 DIAGNOSIS — H903 Sensorineural hearing loss, bilateral: Secondary | ICD-10-CM | POA: Diagnosis not present

## 2019-12-03 DIAGNOSIS — J300 Vasomotor rhinitis: Secondary | ICD-10-CM | POA: Diagnosis not present

## 2019-12-03 DIAGNOSIS — H6122 Impacted cerumen, left ear: Secondary | ICD-10-CM | POA: Diagnosis not present

## 2020-02-22 DIAGNOSIS — H353134 Nonexudative age-related macular degeneration, bilateral, advanced atrophic with subfoveal involvement: Secondary | ICD-10-CM | POA: Diagnosis not present

## 2020-04-27 DIAGNOSIS — Z8719 Personal history of other diseases of the digestive system: Secondary | ICD-10-CM | POA: Diagnosis not present

## 2020-04-27 DIAGNOSIS — R1032 Left lower quadrant pain: Secondary | ICD-10-CM | POA: Diagnosis not present

## 2020-05-12 DIAGNOSIS — D649 Anemia, unspecified: Secondary | ICD-10-CM | POA: Diagnosis not present

## 2020-05-12 DIAGNOSIS — N1831 Chronic kidney disease, stage 3a: Secondary | ICD-10-CM | POA: Diagnosis not present

## 2020-05-12 DIAGNOSIS — R7989 Other specified abnormal findings of blood chemistry: Secondary | ICD-10-CM | POA: Diagnosis not present

## 2020-05-12 DIAGNOSIS — Z681 Body mass index (BMI) 19 or less, adult: Secondary | ICD-10-CM | POA: Diagnosis not present

## 2020-05-12 DIAGNOSIS — Z139 Encounter for screening, unspecified: Secondary | ICD-10-CM | POA: Diagnosis not present

## 2020-05-12 DIAGNOSIS — M810 Age-related osteoporosis without current pathological fracture: Secondary | ICD-10-CM | POA: Diagnosis not present

## 2020-08-25 DIAGNOSIS — H353134 Nonexudative age-related macular degeneration, bilateral, advanced atrophic with subfoveal involvement: Secondary | ICD-10-CM | POA: Diagnosis not present

## 2020-09-18 DIAGNOSIS — Z0000 Encounter for general adult medical examination without abnormal findings: Secondary | ICD-10-CM | POA: Diagnosis not present

## 2020-09-18 DIAGNOSIS — Z9181 History of falling: Secondary | ICD-10-CM | POA: Diagnosis not present

## 2020-09-18 DIAGNOSIS — Z1331 Encounter for screening for depression: Secondary | ICD-10-CM | POA: Diagnosis not present

## 2020-11-10 DIAGNOSIS — R7401 Elevation of levels of liver transaminase levels: Secondary | ICD-10-CM | POA: Diagnosis not present

## 2020-11-10 DIAGNOSIS — N1831 Chronic kidney disease, stage 3a: Secondary | ICD-10-CM | POA: Diagnosis not present

## 2020-11-10 DIAGNOSIS — M810 Age-related osteoporosis without current pathological fracture: Secondary | ICD-10-CM | POA: Diagnosis not present

## 2020-11-10 DIAGNOSIS — Z6820 Body mass index (BMI) 20.0-20.9, adult: Secondary | ICD-10-CM | POA: Diagnosis not present

## 2021-02-25 DIAGNOSIS — H353134 Nonexudative age-related macular degeneration, bilateral, advanced atrophic with subfoveal involvement: Secondary | ICD-10-CM | POA: Diagnosis not present

## 2021-05-13 DIAGNOSIS — R634 Abnormal weight loss: Secondary | ICD-10-CM | POA: Diagnosis not present

## 2021-05-13 DIAGNOSIS — N189 Chronic kidney disease, unspecified: Secondary | ICD-10-CM | POA: Diagnosis not present

## 2021-05-13 DIAGNOSIS — Z681 Body mass index (BMI) 19 or less, adult: Secondary | ICD-10-CM | POA: Diagnosis not present

## 2021-05-13 DIAGNOSIS — D631 Anemia in chronic kidney disease: Secondary | ICD-10-CM | POA: Diagnosis not present

## 2021-05-13 DIAGNOSIS — N1831 Chronic kidney disease, stage 3a: Secondary | ICD-10-CM | POA: Diagnosis not present

## 2021-08-18 DIAGNOSIS — R0981 Nasal congestion: Secondary | ICD-10-CM | POA: Diagnosis not present

## 2021-08-18 DIAGNOSIS — J019 Acute sinusitis, unspecified: Secondary | ICD-10-CM | POA: Diagnosis not present

## 2021-08-18 DIAGNOSIS — U071 COVID-19: Secondary | ICD-10-CM | POA: Diagnosis not present

## 2022-01-06 ENCOUNTER — Emergency Department: Payer: PPO

## 2022-01-06 ENCOUNTER — Inpatient Hospital Stay
Admission: EM | Admit: 2022-01-06 | Discharge: 2022-01-12 | DRG: 481 | Disposition: A | Discharge status: Skilled Nursing Facility | Payer: PPO | Attending: Internal Medicine | Admitting: Internal Medicine

## 2022-01-06 ENCOUNTER — Other Ambulatory Visit: Payer: Self-pay

## 2022-01-06 DIAGNOSIS — W19XXXA Unspecified fall, initial encounter: Secondary | ICD-10-CM | POA: Diagnosis not present

## 2022-01-06 DIAGNOSIS — S72002S Fracture of unspecified part of neck of left femur, sequela: Secondary | ICD-10-CM

## 2022-01-06 DIAGNOSIS — W010XXA Fall on same level from slipping, tripping and stumbling without subsequent striking against object, initial encounter: Secondary | ICD-10-CM | POA: Diagnosis present

## 2022-01-06 DIAGNOSIS — Z79899 Other long term (current) drug therapy: Secondary | ICD-10-CM

## 2022-01-06 DIAGNOSIS — N9489 Other specified conditions associated with female genital organs and menstrual cycle: Secondary | ICD-10-CM

## 2022-01-06 DIAGNOSIS — D649 Anemia, unspecified: Secondary | ICD-10-CM | POA: Diagnosis present

## 2022-01-06 DIAGNOSIS — S72002A Fracture of unspecified part of neck of left femur, initial encounter for closed fracture: Secondary | ICD-10-CM

## 2022-01-06 DIAGNOSIS — I951 Orthostatic hypotension: Secondary | ICD-10-CM | POA: Diagnosis present

## 2022-01-06 DIAGNOSIS — S72142A Displaced intertrochanteric fracture of left femur, initial encounter for closed fracture: Principal | ICD-10-CM | POA: Diagnosis present

## 2022-01-06 DIAGNOSIS — R001 Bradycardia, unspecified: Secondary | ICD-10-CM | POA: Diagnosis present

## 2022-01-06 DIAGNOSIS — M810 Age-related osteoporosis without current pathological fracture: Secondary | ICD-10-CM | POA: Diagnosis present

## 2022-01-06 DIAGNOSIS — D62 Acute posthemorrhagic anemia: Secondary | ICD-10-CM | POA: Diagnosis not present

## 2022-01-06 DIAGNOSIS — S72009A Fracture of unspecified part of neck of unspecified femur, initial encounter for closed fracture: Secondary | ICD-10-CM | POA: Diagnosis present

## 2022-01-06 DIAGNOSIS — S7292XA Unspecified fracture of left femur, initial encounter for closed fracture: Secondary | ICD-10-CM | POA: Diagnosis present

## 2022-01-06 DIAGNOSIS — Y92009 Unspecified place in unspecified non-institutional (private) residence as the place of occurrence of the external cause: Secondary | ICD-10-CM

## 2022-01-06 DIAGNOSIS — E872 Acidosis, unspecified: Secondary | ICD-10-CM | POA: Diagnosis present

## 2022-01-06 DIAGNOSIS — N1832 Chronic kidney disease, stage 3b: Secondary | ICD-10-CM | POA: Diagnosis present

## 2022-01-06 DIAGNOSIS — E663 Overweight: Secondary | ICD-10-CM | POA: Diagnosis present

## 2022-01-06 DIAGNOSIS — D509 Iron deficiency anemia, unspecified: Secondary | ICD-10-CM

## 2022-01-06 DIAGNOSIS — I959 Hypotension, unspecified: Secondary | ICD-10-CM | POA: Diagnosis present

## 2022-01-06 DIAGNOSIS — Z681 Body mass index (BMI) 19 or less, adult: Secondary | ICD-10-CM

## 2022-01-06 DIAGNOSIS — Z881 Allergy status to other antibiotic agents status: Secondary | ICD-10-CM

## 2022-01-06 DIAGNOSIS — Z7982 Long term (current) use of aspirin: Secondary | ICD-10-CM

## 2022-01-06 DIAGNOSIS — E876 Hypokalemia: Secondary | ICD-10-CM | POA: Diagnosis present

## 2022-01-06 DIAGNOSIS — R636 Underweight: Secondary | ICD-10-CM | POA: Diagnosis present

## 2022-01-06 DIAGNOSIS — N182 Chronic kidney disease, stage 2 (mild): Secondary | ICD-10-CM | POA: Diagnosis present

## 2022-01-06 LAB — BRAIN NATRIURETIC PEPTIDE: B Natriuretic Peptide: 132.5 pg/mL — ABNORMAL HIGH (ref 0.0–100.0)

## 2022-01-06 LAB — CBC WITH DIFFERENTIAL/PLATELET
Abs Immature Granulocytes: 0.05 10*3/uL (ref 0.00–0.07)
Basophils Absolute: 0.1 10*3/uL (ref 0.0–0.1)
Basophils Relative: 1 %
Eosinophils Absolute: 0 10*3/uL (ref 0.0–0.5)
Eosinophils Relative: 0 %
HCT: 28.8 % — ABNORMAL LOW (ref 36.0–46.0)
Hemoglobin: 9.3 g/dL — ABNORMAL LOW (ref 12.0–15.0)
Immature Granulocytes: 0 %
Lymphocytes Relative: 10 %
Lymphs Abs: 1.1 10*3/uL (ref 0.7–4.0)
MCH: 33.7 pg (ref 26.0–34.0)
MCHC: 32.3 g/dL (ref 30.0–36.0)
MCV: 104.3 fL — ABNORMAL HIGH (ref 80.0–100.0)
Monocytes Absolute: 0.7 10*3/uL (ref 0.1–1.0)
Monocytes Relative: 7 %
Neutro Abs: 9.2 10*3/uL — ABNORMAL HIGH (ref 1.7–7.7)
Neutrophils Relative %: 82 %
Platelets: 160 10*3/uL (ref 150–400)
RBC: 2.76 MIL/uL — ABNORMAL LOW (ref 3.87–5.11)
RDW: 13.7 % (ref 11.5–15.5)
WBC: 11.2 10*3/uL — ABNORMAL HIGH (ref 4.0–10.5)
nRBC: 0 % (ref 0.0–0.2)

## 2022-01-06 LAB — TROPONIN I (HIGH SENSITIVITY)
Troponin I (High Sensitivity): 8 ng/L (ref <18)
Troponin I (High Sensitivity): 7 ng/L (ref <18)

## 2022-01-06 LAB — COMPREHENSIVE METABOLIC PANEL
ALT: 17 U/L (ref 0–44)
AST: 29 U/L (ref 15–41)
Albumin: 3.7 g/dL (ref 3.5–5.0)
Alkaline Phosphatase: 57 U/L (ref 38–126)
Anion gap: 7 (ref 5–15)
BUN: 37 mg/dL — ABNORMAL HIGH (ref 8–23)
CO2: 25 mmol/L (ref 22–32)
Calcium: 8.4 mg/dL — ABNORMAL LOW (ref 8.9–10.3)
Chloride: 103 mmol/L (ref 98–111)
Creatinine, Ser: 1.26 mg/dL — ABNORMAL HIGH (ref 0.44–1.00)
GFR, Estimated: 41 mL/min — ABNORMAL LOW (ref >60)
Glucose, Bld: 141 mg/dL — ABNORMAL HIGH (ref 70–99)
Potassium: 4 mmol/L (ref 3.5–5.1)
Sodium: 135 mmol/L (ref 135–145)
Total Bilirubin: 0.4 mg/dL (ref 0.3–1.2)
Total Protein: 6.8 g/dL (ref 6.5–8.1)

## 2022-01-06 LAB — LACTIC ACID, PLASMA: Lactic Acid, Venous: 2.4 mmol/L (ref 0.5–1.9)

## 2022-01-06 LAB — CK: Total CK: 171 U/L (ref 38–234)

## 2022-01-06 LAB — APTT: aPTT: 28 seconds (ref 24–36)

## 2022-01-06 LAB — PROTIME-INR
INR: 1.1 (ref 0.8–1.2)
Prothrombin Time: 14.5 seconds (ref 11.4–15.2)

## 2022-01-06 MED ORDER — PANTOPRAZOLE SODIUM 40 MG IV SOLR
40.0000 mg | Freq: Two times a day (BID) | INTRAVENOUS | Status: DC
Start: 2022-01-06 — End: 2022-01-10
  Administered 2022-01-06 – 2022-01-10 (×8): 40 mg via INTRAVENOUS
  Filled 2022-01-06 (×8): qty 10

## 2022-01-06 MED ORDER — BISACODYL 5 MG PO TBEC: 5.0000 mg | DELAYED_RELEASE_TABLET | Freq: Every day | ORAL | Status: DC | PRN

## 2022-01-06 MED ORDER — HYDROCODONE-ACETAMINOPHEN 5-325 MG PO TABS: 1.0000 | ORAL_TABLET | Freq: Four times a day (QID) | ORAL | Status: DC | PRN

## 2022-01-06 MED ORDER — DOCUSATE SODIUM 100 MG PO CAPS
100.0000 mg | ORAL_CAPSULE | Freq: Two times a day (BID) | ORAL | Status: DC
  Administered 2022-01-06: 100 mg via ORAL
  Filled 2022-01-06: qty 1

## 2022-01-06 MED ORDER — ONDANSETRON HCL 4 MG/2ML IJ SOLN
4.0000 mg | Freq: Once | INTRAMUSCULAR | Status: AC
  Administered 2022-01-06: 4 mg via INTRAVENOUS
  Filled 2022-01-06: qty 2

## 2022-01-06 MED ORDER — LACTATED RINGERS IV BOLUS
1000.0000 mL | Freq: Once | INTRAVENOUS | Status: AC
  Administered 2022-01-06: 1000 mL via INTRAVENOUS

## 2022-01-06 MED ORDER — LACTATED RINGERS IV SOLN: INTRAVENOUS | Status: DC

## 2022-01-06 MED ORDER — CEFAZOLIN SODIUM-DEXTROSE 1-4 GM/50ML-% IV SOLN
1.0000 g | INTRAVENOUS | Status: AC
  Administered 2022-01-07: 1 g via INTRAVENOUS

## 2022-01-06 MED ORDER — POLYETHYLENE GLYCOL 3350 17 G PO PACK
17.0000 g | PACK | Freq: Every day | ORAL | Status: DC | PRN
  Administered 2022-01-11: 17 g via ORAL
  Filled 2022-01-06: qty 1

## 2022-01-06 MED ORDER — HYDROMORPHONE HCL 1 MG/ML IJ SOLN
1.0000 mg | Freq: Four times a day (QID) | INTRAMUSCULAR | Status: DC | PRN
  Administered 2022-01-06 – 2022-01-07 (×4): 1 mg via INTRAVENOUS
  Filled 2022-01-06 (×5): qty 1

## 2022-01-06 MED ORDER — MORPHINE SULFATE (PF) 4 MG/ML IV SOLN
4.0000 mg | Freq: Once | INTRAVENOUS | Status: AC
  Administered 2022-01-06: 4 mg via INTRAVENOUS
  Filled 2022-01-06: qty 1

## 2022-01-06 MED ORDER — MORPHINE SULFATE (PF) 2 MG/ML IV SOLN: 2.0000 mg | INTRAVENOUS | Status: DC | PRN

## 2022-01-06 NOTE — H&P (Addendum)
?History and Physical  ? ? ?Patient: Cassie Solis [id number] DOB: 03/28/1929 ?DOA: 01/06/2022 ?DOS: the patient was seen and examined on 01/06/2022 ?PCP: Pcp, No  ?Patient coming from: Home ? ?Chief Complaint:  ?Chief Complaint  ?Patient presents with  ? Fall  ?  Mech fall, left hip paiin , shorting   ? ?HPI: Cassie Solis is a 88 y.o. female with no significant past medical history presenting with a fall and pain in the left hip.  Son at bedside states that mom was trying to move birdfeeders in her garden and fell patient denies that it fell on her but it did fall while she was trying to move it.  Patient denies any dizziness or loss of consciousness or any history of falls. ?Patient does not report any blurred vision palpitations abdominal pain fevers chills.  Patient does report pain in her hip.xray of her hip shows Comminuted intertrochanteric fracture of the left femur with slight ?lateral angulation.  Patient does report that her primary care provider told her that her kidney function is good but is that bad.  Patient does not know of her anemia.  Patient denies any diarrhea black stools melena hematochezia or intermittent bowel changes. ? ?In the ED patient is alert awake oriented and states that she is having pain and would like something for pain. Patient is returning from her head CT.  Per report from ED MD patient is to have orthopedic evaluation by Dr. Rudene Christians and is scheduled or anticipated to have start operative report tomorrow morning. ? ?Review of Systems  ?Unable to perform ROS: Age  ?Musculoskeletal:  Positive for falls and joint pain.  ? ?Past Medical History:  ?Diagnosis Date  ? 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 Osteoporosis   ? Seasonal allergies   ? Vertigo   ? rare  ? ?Past Surgical History:  ?Procedure Laterality Date  ? ABDOMINAL HYSTERECTOMY    ? CATARACT EXTRACTION W/PHACO Left 06/25/2015  ? Procedure: CATARACT EXTRACTION PHACO AND INTRAOCULAR LENS PLACEMENT (IOC);  Surgeon:  Leandrew Koyanagi, MD;  Location: [facility];  Service: Ophthalmology;  Laterality: Left;  PT PREFERS EARLY MORNING PER DR OFFICE  ? CATARACT EXTRACTION W/PHACO Right 09/10/2015  ? Procedure: CATARACT EXTRACTION PHACO AND INTRAOCULAR LENS PLACEMENT (IOC);  Surgeon: Leandrew Koyanagi, MD;  Location: [facility];  Service: Ophthalmology;  Laterality: Right;  pt prefers early AM  ? ?Social History:  reports that she has never smoked. She does not have any smokeless tobacco history on file. She reports that she does not drink alcohol. No history on file for drug use. ? ?Allergies  ?Allergen Reactions  ? Azithromycin Nausea Only  ?  GI issues  ? ? ?History reviewed. No pertinent family history. ? ?Prior to Admission medications   ?Medication Sig Start Date End Date Taking? Authorizing Provider  ?aspirin 81 MG tablet Take 81 mg by mouth daily. AM    [provider]  ?Calcium Citrate (CITRACAL PO) Take by mouth daily. AM    [provider]  ?meclizine (ANTIVERT) 25 MG tablet Take 25 mg by mouth as needed for dizziness.    [provider]  ? ? ?Physical Exam: ?Vitals:  ? 01/06/22 1900 01/06/22 1930 01/06/22 1940 01/06/22 1950  ?BP: (!) 116/52 (!) 106/55 122/60 137/63  ?Pulse: (!) 48 (!) 51 (!) 55 (!) 55  ?Resp: 17 17 13 19   ?Temp:      ?TempSrc:      ?SpO2:  100% 100% 100% 100%  ?Weight:      ?Height:      ?Physical Exam ?Vitals and nursing note reviewed.  ?Constitutional:   ?   General: She is not in acute distress. ?   Appearance: Normal appearance. She is not ill-appearing, toxic-appearing or diaphoretic.  ?HENT:  ?   Head: Normocephalic and atraumatic.  ?   Right Ear: Hearing and external ear normal.  ?   Left Ear: Hearing and external ear normal.  ?   Nose: Nose normal. No nasal deformity.  ?   Mouth/Throat:  ?   Lips: Pink.  ?   Mouth: Mucous membranes are moist.  ?   Tongue: No lesions.  ?   Pharynx: Oropharynx is clear.  ?Eyes:  ?   Extraocular Movements: Extraocular  movements intact.  ?   Pupils: Pupils are equal, round, and reactive to light.  ?Cardiovascular:  ?   Rate and Rhythm: Normal rate and regular rhythm.  ?   Pulses: Normal pulses.  ?   Heart sounds: Normal heart sounds.  ?Pulmonary:  ?   Effort: Pulmonary effort is normal.  ?   Breath sounds: Rhonchi present.  ?Abdominal:  ?   General: Bowel sounds are normal. There is no distension.  ?   Palpations: Abdomen is soft. There is no mass.  ?   Tenderness: There is no abdominal tenderness. There is no guarding.  ?   Hernia: No hernia is present.  ?Musculoskeletal:  ?   Left hip: Deformity and tenderness present. Decreased strength.  ?   Right lower leg: No edema.  ?   Left lower leg: No edema.  ?   Comments: Ext rotated left hip and pain on slight movement and ROM unable to assess due to pain.   ?Skin: ?   General: Skin is warm.  ?Neurological:  ?   General: No focal deficit present.  ?   Mental Status: She is alert and oriented to person, place, and time.  ?   Cranial Nerves: Cranial nerves 2-12 are intact.  ?   Motor: Motor function is intact.  ?Psychiatric:     ?   Attention and Perception: Attention normal.     ?   Mood and Affect: Mood normal.     ?   Speech: Speech normal.     ?   Behavior: Behavior normal. Behavior is cooperative.     ?   Cognition and Memory: Cognition normal.  ? ?Data Reviewed: ?Results for orders placed or performed during the hospital encounter of 01/06/22 (from the past 24 hour(s))  ?Comprehensive metabolic panel     Status: Abnormal  ? Collection Time: 01/06/22  5:36 PM  ?Result Value Ref Range  ? Sodium 135 135 - 145 mmol/L  ? Potassium 4.0 3.5 - 5.1 mmol/L  ? Chloride 103 98 - 111 mmol/L  ? CO2 25 22 - 32 mmol/L  ? Glucose, Bld 141 (H) 70 - 99 mg/dL  ? BUN 37 (H) 8 - 23 mg/dL  ? Creatinine, Ser 1.26 (H) 0.44 - 1.00 mg/dL  ? Calcium 8.4 (L) 8.9 - 10.3 mg/dL  ? Total Protein 6.8 6.5 - 8.1 g/dL  ? Albumin 3.7 3.5 - 5.0 g/dL  ? AST 29 15 - 41 U/L  ? ALT 17 0 - 44 U/L  ? Alkaline Phosphatase 57  38 - 126 U/L  ? Total Bilirubin 0.4 0.3 - 1.2 mg/dL  ? GFR, Estimated 41 (L) >60 mL/min  ?  Anion gap 7 5 - 15  ?CBC with Differential     Status: Abnormal  ? Collection Time: 01/06/22  5:36 PM  ?Result Value Ref Range  ? WBC 11.2 (H) 4.0 - 10.5 K/uL  ? RBC 2.76 (L) 3.87 - 5.11 MIL/uL  ? Hemoglobin 9.3 (L) 12.0 - 15.0 g/dL  ? HCT 28.8 (L) 36.0 - 46.0 %  ? MCV 104.3 (H) 80.0 - 100.0 fL  ? MCH 33.7 26.0 - 34.0 pg  ? MCHC 32.3 30.0 - 36.0 g/dL  ? RDW 13.7 11.5 - 15.5 %  ? Platelets 160 150 - 400 K/uL  ? nRBC 0.0 0.0 - 0.2 %  ? Neutrophils Relative % 82 %  ? Neutro Abs 9.2 (H) 1.7 - 7.7 K/uL  ? Lymphocytes Relative 10 %  ? Lymphs Abs 1.1 0.7 - 4.0 K/uL  ? Monocytes Relative 7 %  ? Monocytes Absolute 0.7 0.1 - 1.0 K/uL  ? Eosinophils Relative 0 %  ? Eosinophils Absolute 0.0 0.0 - 0.5 K/uL  ? Basophils Relative 1 %  ? Basophils Absolute 0.1 0.0 - 0.1 K/uL  ? Immature Granulocytes 0 %  ? Abs Immature Granulocytes 0.05 0.00 - 0.07 K/uL  ?Type and screen [facility]     Status: None (Preliminary result)  ? Collection Time: 01/06/22  5:36 PM  ?Result Value Ref Range  ? ABO/RH(D) PENDING   ? Antibody Screen PENDING   ? Sample Expiration    ?  01/09/2022,2359 ?Performed at [facility], [address], [place], [place] [address] ?  ?Protime-INR     Status: None  ? Collection Time: 01/06/22  5:36 PM  ?Result Value Ref Range  ? Prothrombin Time 14.5 11.4 - 15.2 seconds  ? INR 1.1 0.8 - 1.2  ?APTT     Status: None  ? Collection Time: 01/06/22  5:36 PM  ?Result Value Ref Range  ? aPTT 28 24 - 36 seconds  ? ? ? ?Assessment and Plan: ?* Fall at home, initial encounter ?Admit to cardiac tele and fall precaution. ?PT consult. ? ? ?Closed left hip fracture ([place]) ?Pt has left hip fracture and is scheduled to have surgery in am. ?Dr.menz on call orthopedic consulted by edmd.  ? ?CKD (chronic kidney disease) stage 2, GFR 60-89 ml/min ?Lab Results  ?Component Value Date  ? CREATININE 1.26 (H) 01/06/2022   ? ?Suspect this is chronic and no meds or history of CKD.  ?We will avoid contrast and follow. ? ?Anemia ?Type and screen. ? ?  Latest Ref Rng & Units 01/06/2022  ?  5:36 PM  ?CBC  ?WBC 4.0 - 10.5 K/uL 11.2    ?Hem

## 2022-01-06 NOTE — ED Notes (Signed)
Patient provided with lemon glycerin mouth swabs. Repositioned in bed, call bell within reach. Siderails up x 2. Family remains at the bedside. ?

## 2022-01-06 NOTE — Assessment & Plan Note (Deleted)
Admit to cardiac tele and fall precaution. ?PT consult. ? ?

## 2022-01-06 NOTE — Assessment & Plan Note (Addendum)
Patient feeling better with moving around today.  Received 3 units of packed red blood cells during the hospital course. ? ?

## 2022-01-06 NOTE — Assessment & Plan Note (Deleted)
Lab Results  ?Component Value Date  ? CREATININE 1.26 (H) 01/06/2022  ? ?Suspect this is chronic and no meds or history of CKD.  ?We will avoid contrast and follow. ?

## 2022-01-06 NOTE — Assessment & Plan Note (Deleted)
Type and screen. ? ?  Latest Ref Rng & Units 01/06/2022  ?  5:36 PM  ?CBC  ?WBC 4.0 - 10.5 K/uL 11.2    ?Hemoglobin 12.0 - 15.0 g/dL 9.3    ?Hematocrit 36.0 - 46.0 % 28.8    ?Platelets 150 - 400 K/uL 160    ? ? ?Occult when BM. ?iv ppi.  ?

## 2022-01-06 NOTE — Assessment & Plan Note (Addendum)
Not on any rate controlling medications. ?

## 2022-01-06 NOTE — Assessment & Plan Note (Addendum)
Patient had a mechanical fall and found to have a closed left intertrochanteric fracture of the left femur with slight lateral angulation.  Dr. Rosita Kea performed a intramedullary nail intertrochanteric procedure on 01/07/2022.  Pain control.  Physical therapy evaluation appreciated.  CT scan showing bleeding in the left hip and buttock area.  Holding blood thinners at this time including DVT prophylaxis Lovenox injections.  Watch hemoglobin another day prior to disposition. ?

## 2022-01-06 NOTE — ED Triage Notes (Signed)
Mech fall, shorting, l;eft leg, no loc, no thinners  ?

## 2022-01-06 NOTE — ED Notes (Signed)
Patient desat to 86-87% after administration of pain medication. O2/2L placed on pt. Sats maintained at 98-100% ?

## 2022-01-06 NOTE — ED Provider Notes (Addendum)
? ?[facility] ?Provider Note ? ? ? Event Date/Time  ? First MD Initiated Contact with Patient 01/06/22 1734   ?  (approximate) ? ? ?History  ? ?Fall (Mech fall, left hip paiin , shorting ) ? ? ?HPI ? ?Cassie Solis is a 88 y.o. female who lives by herself and has no medical problems.  She was rolling a ceramic pot onto a bird bath to move the birdbath to a new position when she tripped and fell landed on her hip.  She has a lot of pain in her hip and cannot walk on it.  She did not hit her head and has no headache or head pain or neck pain. ? ?  ? ? ?Physical Exam  ? ?Triage Vital Signs: ?ED Triage Vitals  ?Enc Vitals Group  ?   BP --   ?   Pulse Rate 01/06/22 1732 68  ?   Resp 01/06/22 1732 17  ?   Temp 01/06/22 1732 97.7 ?F (36.5 ?C)  ?   Temp Source 01/06/22 1732 Oral  ?   SpO2 01/06/22 1732 100 %  ?   Weight 01/06/22 1733 121 lb 4.1 oz (55 kg)  ?   Height 01/06/22 1733 5\' 8"  (1.727 m)  ?   Head Circumference --   ?   Peak Flow --   ?   Pain Score --   ?   Pain Loc --   ?   Pain Edu? --   ?   Excl. in GC? --   ? ? ?Most recent vital signs: ?Vitals:  ? 01/06/22 2000 01/06/22 2010  ?BP: 129/67   ?Pulse: 62 (!) 57  ?Resp:  15  ?Temp:  97.9 ?F (36.6 ?C)  ?SpO2: 100% 100%  ? ? ? ?General: Awake, no distress! ?CV:  Good peripheral perfusion.  Heart regular rate and rhythm I do not hear any murmurs ?Resp:  Normal effort.  Lungs are clear ?Abd:  No distention.  ?Extremities: Pain in the left hip ? ?Labs ?(all labs ordered are listed, but only abnormal results are displayed) ?Labs Reviewed  ?COMPREHENSIVE METABOLIC PANEL - Abnormal; Notable for the following components:  ?    Result Value  ? Glucose, Bld 141 (*)   ? BUN 37 (*)   ? Creatinine, Ser 1.26 (*)   ? Calcium 8.4 (*)   ? GFR, Estimated 41 (*)   ? All other components within normal limits  ?CBC WITH DIFFERENTIAL/PLATELET - Abnormal; Notable for the following components:  ? WBC 11.2 (*)   ? RBC 2.76 (*)   ? Hemoglobin 9.3 (*)   ? HCT 28.8 (*)    ? MCV 104.3 (*)   ? Neutro Abs 9.2 (*)   ? All other components within normal limits  ?BRAIN NATRIURETIC PEPTIDE - Abnormal; Notable for the following components:  ? B Natriuretic Peptide 132.5 (*)   ? All other components within normal limits  ?PROTIME-INR  ?APTT  ?CK  ?LACTIC ACID, PLASMA  ?BASIC METABOLIC PANEL  ?CBC  ?TYPE AND SCREEN  ?TROPONIN I (HIGH SENSITIVITY)  ?TROPONIN I (HIGH SENSITIVITY)  ? ? ? ?EKG ? ?EKG read and interpreted by me shows sinus bradycardia rate of 55 normal axis no acute ST-T wave changes there is a flipped T in aVL only. ? ? ?RADIOLOGY ?Chest x-ray read by me radiology reading still pending shows a comminuted what appears to be intertrochanteric fracture left hip ?Chest x-ray reviewed by me not read  yet by radiology appears to be normal.  There may be a little haziness in the right apex. ? ? ?PROCEDURES: ? ?Critical Care performed:  ? ?Procedures ? ? ?MEDICATIONS ORDERED IN ED: ?Medications  ?ceFAZolin (ANCEF) IVPB 1 g/50 mL premix (has no administration in time range)  ?HYDROmorphone (DILAUDID) injection 1 mg (1 mg Intravenous Given 01/06/22 1927)  ?docusate sodium (COLACE) capsule 100 mg (has no administration in time range)  ?polyethylene glycol (MIRALAX / GLYCOLAX) packet 17 g (has no administration in time range)  ?bisacodyl (DULCOLAX) EC tablet 5 mg (has no administration in time range)  ?lactated ringers infusion (has no administration in time range)  ?pantoprazole (PROTONIX) injection 40 mg (has no administration in time range)  ?morphine (PF) 4 MG/ML injection 4 mg (4 mg Intravenous Given 01/06/22 1738)  ?ondansetron (ZOFRAN) injection 4 mg (4 mg Intravenous Given 01/06/22 1737)  ?lactated ringers bolus 1,000 mL (1,000 mLs Intravenous New Bag/Given 01/06/22 1856)  ? ? ? ?IMPRESSION / MDM / ASSESSMENT AND PLAN / ED COURSE  ?I reviewed the triage vital signs and the nursing notes. ? ?Patient denies any headache or any other neurological symptoms.  She says the big bruise on her  head is from bumping her head on something a week ago.  I elected not to CT her head because she has not had any symptoms and the injury is a week old.  Patient has no neck pain to palpation or movement. ? ? ?The patient is on the cardiac monitor to evaluate for evidence of arrhythmia and/or significant heart rate changes. ? ?  ? ? ?FINAL CLINICAL IMPRESSION(S) / ED DIAGNOSES  ? ?Final diagnoses:  ?Fall, initial encounter  ?Closed fracture of left hip, initial encounter (HCC)  ? ? ? ?Rx / DC Orders  ? ?ED Discharge Orders   ? ? None  ? ?  ? ? ? ?Note:  This document was prepared using Dragon voice recognition software and may include unintentional dictation errors. ?  ?Arnaldo Natal, MD ?01/06/22 1835 ? ?  ?Arnaldo Natal, MD ?01/06/22 2016 ?Reexam of the patient shows no apparent skin defects hip fracture does appear to be closed as I said earlier. ? ?  ?Arnaldo Natal, MD ?01/06/22 2020 ? ?

## 2022-01-06 NOTE — ED Notes (Signed)
O2 @ 2LPM via n/c placed after IV pain med.  ?

## 2022-01-07 ENCOUNTER — Encounter
Admission: EM | Disposition: A | Discharge status: Skilled Nursing Facility | Payer: Self-pay | Source: Home / Self Care | Attending: Internal Medicine

## 2022-01-07 ENCOUNTER — Inpatient Hospital Stay: Payer: PPO

## 2022-01-07 ENCOUNTER — Inpatient Hospital Stay: Payer: PPO | Admitting: Anesthesiology

## 2022-01-07 ENCOUNTER — Other Ambulatory Visit: Payer: Self-pay

## 2022-01-07 DIAGNOSIS — I959 Hypotension, unspecified: Secondary | ICD-10-CM

## 2022-01-07 DIAGNOSIS — R636 Underweight: Secondary | ICD-10-CM | POA: Diagnosis present

## 2022-01-07 DIAGNOSIS — Z881 Allergy status to other antibiotic agents status: Secondary | ICD-10-CM | POA: Diagnosis not present

## 2022-01-07 DIAGNOSIS — N9489 Other specified conditions associated with female genital organs and menstrual cycle: Secondary | ICD-10-CM | POA: Diagnosis not present

## 2022-01-07 DIAGNOSIS — N1832 Chronic kidney disease, stage 3b: Secondary | ICD-10-CM | POA: Diagnosis present

## 2022-01-07 DIAGNOSIS — D509 Iron deficiency anemia, unspecified: Secondary | ICD-10-CM | POA: Diagnosis not present

## 2022-01-07 DIAGNOSIS — S72142A Displaced intertrochanteric fracture of left femur, initial encounter for closed fracture: Secondary | ICD-10-CM | POA: Diagnosis present

## 2022-01-07 DIAGNOSIS — Y92009 Unspecified place in unspecified non-institutional (private) residence as the place of occurrence of the external cause: Secondary | ICD-10-CM | POA: Diagnosis not present

## 2022-01-07 DIAGNOSIS — Z7982 Long term (current) use of aspirin: Secondary | ICD-10-CM | POA: Diagnosis not present

## 2022-01-07 DIAGNOSIS — S72002S Fracture of unspecified part of neck of left femur, sequela: Secondary | ICD-10-CM | POA: Diagnosis not present

## 2022-01-07 DIAGNOSIS — S7292XA Unspecified fracture of left femur, initial encounter for closed fracture: Secondary | ICD-10-CM | POA: Diagnosis present

## 2022-01-07 DIAGNOSIS — E872 Acidosis, unspecified: Secondary | ICD-10-CM

## 2022-01-07 DIAGNOSIS — Z79899 Other long term (current) drug therapy: Secondary | ICD-10-CM | POA: Diagnosis not present

## 2022-01-07 DIAGNOSIS — D62 Acute posthemorrhagic anemia: Secondary | ICD-10-CM | POA: Diagnosis not present

## 2022-01-07 DIAGNOSIS — Z681 Body mass index (BMI) 19 or less, adult: Secondary | ICD-10-CM | POA: Diagnosis not present

## 2022-01-07 DIAGNOSIS — S72002A Fracture of unspecified part of neck of left femur, initial encounter for closed fracture: Secondary | ICD-10-CM | POA: Diagnosis present

## 2022-01-07 DIAGNOSIS — M810 Age-related osteoporosis without current pathological fracture: Secondary | ICD-10-CM | POA: Diagnosis present

## 2022-01-07 DIAGNOSIS — I951 Orthostatic hypotension: Secondary | ICD-10-CM | POA: Diagnosis present

## 2022-01-07 DIAGNOSIS — R001 Bradycardia, unspecified: Secondary | ICD-10-CM

## 2022-01-07 DIAGNOSIS — E876 Hypokalemia: Secondary | ICD-10-CM | POA: Diagnosis present

## 2022-01-07 DIAGNOSIS — W010XXA Fall on same level from slipping, tripping and stumbling without subsequent striking against object, initial encounter: Secondary | ICD-10-CM | POA: Diagnosis present

## 2022-01-07 HISTORY — PX: INTRAMEDULLARY (IM) NAIL INTERTROCHANTERIC: SHX5875

## 2022-01-07 LAB — CBC
HCT: 23.9 % — ABNORMAL LOW (ref 36.0–46.0)
HCT: 23 % — ABNORMAL LOW (ref 36.0–46.0)
Hemoglobin: 7.6 g/dL — ABNORMAL LOW (ref 12.0–15.0)
Hemoglobin: 7.8 g/dL — ABNORMAL LOW (ref 12.0–15.0)
MCH: 33.5 pg (ref 26.0–34.0)
MCH: 33.9 pg (ref 26.0–34.0)
MCHC: 31.8 g/dL (ref 30.0–36.0)
MCHC: 33.9 g/dL (ref 30.0–36.0)
MCV: 105.3 fL — ABNORMAL HIGH (ref 80.0–100.0)
MCV: 100 fL (ref 80.0–100.0)
Platelets: 142 10*3/uL — ABNORMAL LOW (ref 150–400)
Platelets: 131 10*3/uL — ABNORMAL LOW (ref 150–400)
RBC: 2.27 MIL/uL — ABNORMAL LOW (ref 3.87–5.11)
RBC: 2.3 MIL/uL — ABNORMAL LOW (ref 3.87–5.11)
RDW: 13.5 % (ref 11.5–15.5)
RDW: 15.9 % — ABNORMAL HIGH (ref 11.5–15.5)
WBC: 11.2 10*3/uL — ABNORMAL HIGH (ref 4.0–10.5)
WBC: 10.9 10*3/uL — ABNORMAL HIGH (ref 4.0–10.5)
nRBC: 0 % (ref 0.0–0.2)
nRBC: 0 % (ref 0.0–0.2)

## 2022-01-07 LAB — BASIC METABOLIC PANEL
Anion gap: 6 (ref 5–15)
BUN: 35 mg/dL — ABNORMAL HIGH (ref 8–23)
CO2: 26 mmol/L (ref 22–32)
Calcium: 8.1 mg/dL — ABNORMAL LOW (ref 8.9–10.3)
Chloride: 106 mmol/L (ref 98–111)
Creatinine, Ser: 1.16 mg/dL — ABNORMAL HIGH (ref 0.44–1.00)
GFR, Estimated: 45 mL/min — ABNORMAL LOW (ref >60)
Glucose, Bld: 148 mg/dL — ABNORMAL HIGH (ref 70–99)
Potassium: 5 mmol/L (ref 3.5–5.1)
Sodium: 138 mmol/L (ref 135–145)

## 2022-01-07 LAB — PREPARE RBC (CROSSMATCH)

## 2022-01-07 LAB — URINALYSIS, COMPLETE (UACMP) WITH MICROSCOPIC
Bacteria, UA: NONE SEEN
Bilirubin Urine: NEGATIVE
Glucose, UA: NEGATIVE mg/dL
Hgb urine dipstick: NEGATIVE
Ketones, ur: NEGATIVE mg/dL
Leukocytes,Ua: NEGATIVE
Nitrite: NEGATIVE
Protein, ur: NEGATIVE mg/dL
Specific Gravity, Urine: 1.017 (ref 1.005–1.030)
Squamous Epithelial / HPF: NONE SEEN (ref 0–5)
pH: 5 (ref 5.0–8.0)

## 2022-01-07 LAB — LACTIC ACID, PLASMA
Lactic Acid, Venous: 2.9 mmol/L (ref 0.5–1.9)
Lactic Acid, Venous: 2.5 mmol/L (ref 0.5–1.9)

## 2022-01-07 LAB — ABO/RH: ABO/RH(D): A POS

## 2022-01-07 LAB — IRON AND TIBC
Iron: 44 ug/dL (ref 28–170)
Saturation Ratios: 15 % (ref 10.4–31.8)
TIBC: 294 ug/dL (ref 250–450)
UIBC: 250 ug/dL

## 2022-01-07 LAB — CK: Total CK: 146 U/L (ref 38–234)

## 2022-01-07 LAB — CREATININE, SERUM
Creatinine, Ser: 1.14 mg/dL — ABNORMAL HIGH (ref 0.44–1.00)
GFR, Estimated: 46 mL/min — ABNORMAL LOW (ref >60)

## 2022-01-07 LAB — FERRITIN: Ferritin: 54 ng/mL (ref 11–307)

## 2022-01-07 SURGERY — FIXATION, FRACTURE, INTERTROCHANTERIC, WITH INTRAMEDULLARY ROD
Anesthesia: Spinal | Site: Hip | Laterality: Left

## 2022-01-07 MED ORDER — FENTANYL CITRATE (PF) 100 MCG/2ML IJ SOLN
INTRAMUSCULAR | Status: AC
  Filled 2022-01-07: qty 2

## 2022-01-07 MED ORDER — KETAMINE HCL 50 MG/5ML IJ SOSY
PREFILLED_SYRINGE | INTRAMUSCULAR | Status: AC
  Filled 2022-01-07: qty 5

## 2022-01-07 MED ORDER — MENTHOL 3 MG MT LOZG: 1.0000 | LOZENGE | OROMUCOSAL | Status: DC | PRN

## 2022-01-07 MED ORDER — BUPIVACAINE HCL (PF) 0.5 % IJ SOLN
INTRAMUSCULAR | Status: AC
  Filled 2022-01-07: qty 10

## 2022-01-07 MED ORDER — METOCLOPRAMIDE HCL 5 MG/ML IJ SOLN: 5.0000 mg | Freq: Three times a day (TID) | INTRAMUSCULAR | Status: DC | PRN

## 2022-01-07 MED ORDER — ONDANSETRON HCL 4 MG/2ML IJ SOLN: 4.0000 mg | Freq: Four times a day (QID) | INTRAMUSCULAR | Status: DC | PRN

## 2022-01-07 MED ORDER — METHOCARBAMOL 500 MG PO TABS
500.0000 mg | ORAL_TABLET | Freq: Four times a day (QID) | ORAL | Status: DC | PRN
  Administered 2022-01-08: 500 mg via ORAL
  Filled 2022-01-07: qty 1

## 2022-01-07 MED ORDER — HYDROCODONE-ACETAMINOPHEN 7.5-325 MG PO TABS
1.0000 | ORAL_TABLET | ORAL | Status: DC | PRN
  Administered 2022-01-08: 2 via ORAL
  Administered 2022-01-11 (×2): 1 via ORAL
  Filled 2022-01-07: qty 1
  Filled 2022-01-07: qty 2
  Filled 2022-01-07: qty 1

## 2022-01-07 MED ORDER — FENTANYL CITRATE (PF) 100 MCG/2ML IJ SOLN
INTRAMUSCULAR | Status: DC | PRN
  Administered 2022-01-07: 25 ug via INTRAVENOUS

## 2022-01-07 MED ORDER — ONDANSETRON HCL 4 MG PO TABS: 4.0000 mg | ORAL_TABLET | Freq: Four times a day (QID) | ORAL | Status: DC | PRN

## 2022-01-07 MED ORDER — PHENOL 1.4 % MT LIQD: 1.0000 | OROMUCOSAL | Status: DC | PRN

## 2022-01-07 MED ORDER — 0.9 % SODIUM CHLORIDE (POUR BTL) OPTIME
TOPICAL | Status: DC | PRN
  Administered 2022-01-07: 1000 mL

## 2022-01-07 MED ORDER — PROPOFOL 500 MG/50ML IV EMUL
INTRAVENOUS | Status: DC | PRN
  Administered 2022-01-07: 50 ug/kg/min via INTRAVENOUS

## 2022-01-07 MED ORDER — ENOXAPARIN SODIUM 40 MG/0.4ML IJ SOSY: 40.0000 mg | PREFILLED_SYRINGE | INTRAMUSCULAR | Status: DC

## 2022-01-07 MED ORDER — OXYCODONE HCL 5 MG/5ML PO SOLN: 5.0000 mg | Freq: Once | ORAL | Status: DC | PRN

## 2022-01-07 MED ORDER — PROPOFOL 10 MG/ML IV BOLUS
INTRAVENOUS | Status: DC | PRN
  Administered 2022-01-07: 10 mg via INTRAVENOUS

## 2022-01-07 MED ORDER — METHOCARBAMOL 1000 MG/10ML IJ SOLN: 500.0000 mg | Freq: Four times a day (QID) | INTRAVENOUS | Status: DC | PRN

## 2022-01-07 MED ORDER — ACETAMINOPHEN 325 MG PO TABS: 650.0000 mg | ORAL_TABLET | Freq: Once | ORAL | Status: DC

## 2022-01-07 MED ORDER — ACETAMINOPHEN 10 MG/ML IV SOLN: 1000.0000 mg | Freq: Once | INTRAVENOUS | Status: DC | PRN

## 2022-01-07 MED ORDER — TRANEXAMIC ACID-NACL 1000-0.7 MG/100ML-% IV SOLN
1000.0000 mg | Freq: Once | INTRAVENOUS | Status: AC
  Administered 2022-01-07: 1000 mg via INTRAVENOUS

## 2022-01-07 MED ORDER — FENTANYL CITRATE (PF) 100 MCG/2ML IJ SOLN: 25.0000 ug | INTRAMUSCULAR | Status: DC | PRN

## 2022-01-07 MED ORDER — LACTATED RINGERS IV SOLN: INTRAVENOUS | Status: DC

## 2022-01-07 MED ORDER — ADULT MULTIVITAMIN W/MINERALS CH
1.0000 | ORAL_TABLET | Freq: Every day | ORAL | Status: DC
  Administered 2022-01-08 – 2022-01-11 (×4): 1 via ORAL
  Filled 2022-01-07 (×5): qty 1

## 2022-01-07 MED ORDER — MAGNESIUM HYDROXIDE 400 MG/5ML PO SUSP
30.0000 mL | Freq: Every day | ORAL | Status: DC | PRN
  Administered 2022-01-08: 30 mL via ORAL
  Filled 2022-01-07 (×2): qty 30

## 2022-01-07 MED ORDER — BISACODYL 10 MG RE SUPP
10.0000 mg | Freq: Every day | RECTAL | Status: DC | PRN
Start: 2022-01-07 — End: 2022-01-12
  Administered 2022-01-09: 10 mg via RECTAL
  Filled 2022-01-07: qty 1

## 2022-01-07 MED ORDER — SODIUM CHLORIDE 0.9% IV SOLUTION: Freq: Once | INTRAVENOUS | Status: DC

## 2022-01-07 MED ORDER — PHENYLEPHRINE HCL (PRESSORS) 10 MG/ML IV SOLN
INTRAVENOUS | Status: DC | PRN
  Administered 2022-01-07 (×4): 80 ug via INTRAVENOUS
  Administered 2022-01-07: 120 ug via INTRAVENOUS

## 2022-01-07 MED ORDER — HYDROCODONE-ACETAMINOPHEN 5-325 MG PO TABS
1.0000 | ORAL_TABLET | ORAL | Status: DC | PRN
  Administered 2022-01-08: 1 via ORAL
  Administered 2022-01-08: 2 via ORAL
  Administered 2022-01-09: 1 via ORAL
  Administered 2022-01-09 (×2): 2 via ORAL
  Filled 2022-01-07: qty 2
  Filled 2022-01-07: qty 1
  Filled 2022-01-07 (×3): qty 2

## 2022-01-07 MED ORDER — CEFAZOLIN SODIUM-DEXTROSE 1-4 GM/50ML-% IV SOLN
1.0000 g | Freq: Four times a day (QID) | INTRAVENOUS | Status: AC
  Administered 2022-01-08 (×3): 1 g via INTRAVENOUS
  Filled 2022-01-07 (×4): qty 50

## 2022-01-07 MED ORDER — ACETAMINOPHEN 325 MG PO TABS: 325.0000 mg | ORAL_TABLET | Freq: Four times a day (QID) | ORAL | Status: DC | PRN

## 2022-01-07 MED ORDER — EPHEDRINE 5 MG/ML INJ
INTRAVENOUS | Status: AC
  Filled 2022-01-07: qty 5

## 2022-01-07 MED ORDER — TRANEXAMIC ACID-NACL 1000-0.7 MG/100ML-% IV SOLN
INTRAVENOUS | Status: AC
  Filled 2022-01-07: qty 100

## 2022-01-07 MED ORDER — FUROSEMIDE 10 MG/ML IJ SOLN
40.0000 mg | Freq: Once | INTRAMUSCULAR | Status: AC
  Administered 2022-01-07: 40 mg via INTRAVENOUS
  Filled 2022-01-07: qty 4

## 2022-01-07 MED ORDER — PROPOFOL 1000 MG/100ML IV EMUL
INTRAVENOUS | Status: AC
  Filled 2022-01-07: qty 100

## 2022-01-07 MED ORDER — CEFAZOLIN SODIUM-DEXTROSE 1-4 GM/50ML-% IV SOLN
INTRAVENOUS | Status: AC
  Filled 2022-01-07: qty 50

## 2022-01-07 MED ORDER — ALUM & MAG HYDROXIDE-SIMETH 200-200-20 MG/5ML PO SUSP: 30.0000 mL | ORAL | Status: DC | PRN

## 2022-01-07 MED ORDER — DOCUSATE SODIUM 100 MG PO CAPS
100.0000 mg | ORAL_CAPSULE | Freq: Two times a day (BID) | ORAL | Status: DC
  Administered 2022-01-07 – 2022-01-12 (×10): 100 mg via ORAL
  Filled 2022-01-07 (×10): qty 1

## 2022-01-07 MED ORDER — ZOLPIDEM TARTRATE 5 MG PO TABS: 5.0000 mg | ORAL_TABLET | Freq: Every evening | ORAL | Status: DC | PRN

## 2022-01-07 MED ORDER — ONDANSETRON HCL 4 MG/2ML IJ SOLN: 4.0000 mg | Freq: Once | INTRAMUSCULAR | Status: DC | PRN

## 2022-01-07 MED ORDER — MORPHINE SULFATE (PF) 2 MG/ML IV SOLN: 0.5000 mg | INTRAVENOUS | Status: DC | PRN

## 2022-01-07 MED ORDER — OXYCODONE HCL 5 MG PO TABS: 5.0000 mg | ORAL_TABLET | Freq: Once | ORAL | Status: DC | PRN

## 2022-01-07 MED ORDER — METOCLOPRAMIDE HCL 5 MG PO TABS: 5.0000 mg | ORAL_TABLET | Freq: Three times a day (TID) | ORAL | Status: DC | PRN

## 2022-01-07 MED ORDER — LACTATED RINGERS IV BOLUS
1000.0000 mL | Freq: Once | INTRAVENOUS | Status: AC
  Administered 2022-01-07: 1000 mL via INTRAVENOUS

## 2022-01-07 MED ORDER — SODIUM CHLORIDE 0.9 % IV SOLN: INTRAVENOUS | Status: DC

## 2022-01-07 MED ORDER — ENSURE ENLIVE PO LIQD
237.0000 mL | Freq: Two times a day (BID) | ORAL | Status: DC
  Administered 2022-01-08 – 2022-01-12 (×8): 237 mL via ORAL
  Filled 2022-01-07 (×4): qty 237

## 2022-01-07 MED ORDER — BUPIVACAINE HCL (PF) 0.5 % IJ SOLN
INTRAMUSCULAR | Status: DC | PRN
  Administered 2022-01-07: 2.2 mL via INTRATHECAL

## 2022-01-07 SURGICAL SUPPLY — 37 items
BIT DRILL CROWE POINT TWST 4.3 (DRILL) IMPLANT
BNDG COHESIVE 4X5 TAN ST LF (GAUZE/BANDAGES/DRESSINGS) ×4 IMPLANT
CHLORAPREP W/TINT 26 (MISCELLANEOUS) ×2 IMPLANT
DRAPE 3/4 80X56 (DRAPES) ×2 IMPLANT
DRAPE U-SHAPE 47X51 STRL (DRAPES) ×2 IMPLANT
DRILL CROWE POINT TWIST 4.3 (DRILL) ×2
DRSG OPSITE 4X5.5 SM (GAUZE/BANDAGES/DRESSINGS) ×1 IMPLANT
DRSG OPSITE POSTOP 3X4 (GAUZE/BANDAGES/DRESSINGS) ×3 IMPLANT
DRSG OPSITE POSTOP 4X6 (GAUZE/BANDAGES/DRESSINGS) ×2 IMPLANT
GLOVE SURG SYN 9.0  PF PI (GLOVE) ×1
GLOVE SURG SYN 9.0 PF PI (GLOVE) ×1 IMPLANT
GLOVE SURG UNDER POLY LF SZ9 (GLOVE) ×2 IMPLANT
GOWN SRG 2XL LVL 4 RGLN SLV (GOWNS) ×1 IMPLANT
GOWN STRL NON-REIN 2XL LVL4 (GOWNS) ×1
GOWN STRL REUS W/ TWL LRG LVL3 (GOWN DISPOSABLE) ×1 IMPLANT
GOWN STRL REUS W/TWL LRG LVL3 (GOWN DISPOSABLE) ×1
GUIDEPIN VERSANAIL DSP 3.2X444 (ORTHOPEDIC DISPOSABLE SUPPLIES) ×1 IMPLANT
GUIDEWIRE BALL NOSE 100CM (WIRE) ×1 IMPLANT
KIT TURNOVER KIT A (KITS) ×2 IMPLANT
MANIFOLD NEPTUNE II (INSTRUMENTS) ×2 IMPLANT
MAT ABSORB  FLUID 56X50 GRAY (MISCELLANEOUS) ×1
MAT ABSORB FLUID 56X50 GRAY (MISCELLANEOUS) ×1 IMPLANT
NAIL HIP FRA AFFIX 130X9X340 L (Nail) ×1 IMPLANT
NDL FILTER BLUNT 18X1 1/2 (NEEDLE) ×1 IMPLANT
NEEDLE FILTER BLUNT 18X 1/2SAF (NEEDLE) ×1
NEEDLE FILTER BLUNT 18X1 1/2 (NEEDLE) ×1 IMPLANT
NS IRRIG 500ML POUR BTL (IV SOLUTION) ×2 IMPLANT
PACK HIP COMPR (MISCELLANEOUS) ×2 IMPLANT
SCALPEL PROTECTED #15 DISP (BLADE) ×4 IMPLANT
SCREW BONE CORTICAL 5.0X44 (Screw) ×1 IMPLANT
SCREW LAG 10.5MMX105MM HFN (Screw) ×1 IMPLANT
STAPLER SKIN PROX 35W (STAPLE) ×2 IMPLANT
SUT VIC AB 1 CT1 36 (SUTURE) ×2 IMPLANT
SUT VIC AB 2-0 CT1 (SUTURE) ×2 IMPLANT
SYR 10ML LL (SYRINGE) ×2 IMPLANT
SYR BULB IRRIG 60ML STRL (SYRINGE) ×2 IMPLANT
WATER STERILE IRR 500ML POUR (IV SOLUTION) ×2 IMPLANT

## 2022-01-07 NOTE — Consult Note (Signed)
Reason for Consult: Left intertrochanteric hip fracture ?Referring Physician: Dr. Posey Pronto ? ?Cassie Solis is an 88 y.o. female.  ?HPI: Patient is a 88 year old who walks without assistive device suffered a fall in her home.  She lives alone and is able to take care of her self around the house.  She does not drive.  Her daughter gets groceries for her and assists her in staying independent.  She suffered a fall a week ago and has a bruise to her forehead but did not have loss of consciousness with this fall. ? ?Past Medical History:  ?Diagnosis Date  ? 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 Osteoporosis   ? Seasonal allergies   ? Vertigo   ? rare  ? ? ?Past Surgical History:  ?Procedure Laterality Date  ? ABDOMINAL HYSTERECTOMY    ? CATARACT EXTRACTION W/PHACO Left 06/25/2015  ? Procedure: CATARACT EXTRACTION PHACO AND INTRAOCULAR LENS PLACEMENT (IOC);  Surgeon: Leandrew Koyanagi, MD;  Location: [facility];  Service: Ophthalmology;  Laterality: Left;  PT PREFERS EARLY MORNING PER DR OFFICE  ? CATARACT EXTRACTION W/PHACO Right 09/10/2015  ? Procedure: CATARACT EXTRACTION PHACO AND INTRAOCULAR LENS PLACEMENT (IOC);  Surgeon: Leandrew Koyanagi, MD;  Location: [facility];  Service: Ophthalmology;  Laterality: Right;  pt prefers early AM  ? ? ?History reviewed. No pertinent family history. ? ?Social History:  reports that she has never smoked. She does not have any smokeless tobacco history on file. She reports that she does not drink alcohol. No history on file for drug use. ? ?Allergies:  ?Allergies  ?Allergen Reactions  ? Azithromycin Nausea Only  ?  GI issues  ? ? ?Medications: I have reviewed the patient's current medications. ? ?Results for orders placed or performed during the hospital encounter of 01/06/22 (from the past 48 hour(s))  ?Comprehensive metabolic panel     Status: Abnormal  ? Collection Time: 01/06/22  5:36 PM  ?Result Value Ref Range  ? Sodium 135 135 - 145 mmol/L  ?  Potassium 4.0 3.5 - 5.1 mmol/L  ? Chloride 103 98 - 111 mmol/L  ? CO2 25 22 - 32 mmol/L  ? Glucose, Bld 141 (H) 70 - 99 mg/dL  ?  Comment: Glucose reference range applies only to samples taken after fasting for at least 8 hours.  ? BUN 37 (H) 8 - 23 mg/dL  ? Creatinine, Ser 1.26 (H) 0.44 - 1.00 mg/dL  ? Calcium 8.4 (L) 8.9 - 10.3 mg/dL  ? Total Protein 6.8 6.5 - 8.1 g/dL  ? Albumin 3.7 3.5 - 5.0 g/dL  ? AST 29 15 - 41 U/L  ? ALT 17 0 - 44 U/L  ? Alkaline Phosphatase 57 38 - 126 U/L  ? Total Bilirubin 0.4 0.3 - 1.2 mg/dL  ? GFR, Estimated 41 (L) >60 mL/min  ?  Comment: (NOTE) ?Calculated using the CKD-EPI Creatinine Equation (2021) ?  ? Anion gap 7 5 - 15  ?  Comment: Performed at [facility], [address] [place], [place] [address]  ?CBC with Differential     Status: Abnormal  ? Collection Time: 01/06/22  5:36 PM  ?Result Value Ref Range  ? WBC 11.2 (H) 4.0 - 10.5 K/uL  ? RBC 2.76 (L) 3.87 - 5.11 MIL/uL  ? Hemoglobin 9.3 (L) 12.0 - 15.0 g/dL  ? HCT 28.8 (L) 36.0 - 46.0 %  ? MCV 104.3 (H) 80.0 - 100.0 fL  ? MCH 33.7 26.0 - 34.0  pg  ? MCHC 32.3 30.0 - 36.0 g/dL  ? RDW 13.7 11.5 - 15.5 %  ? Platelets 160 150 - 400 K/uL  ? nRBC 0.0 0.0 - 0.2 %  ? Neutrophils Relative % 82 %  ? Neutro Abs 9.2 (H) 1.7 - 7.7 K/uL  ? Lymphocytes Relative 10 %  ? Lymphs Abs 1.1 0.7 - 4.0 K/uL  ? Monocytes Relative 7 %  ? Monocytes Absolute 0.7 0.1 - 1.0 K/uL  ? Eosinophils Relative 0 %  ? Eosinophils Absolute 0.0 0.0 - 0.5 K/uL  ? Basophils Relative 1 %  ? Basophils Absolute 0.1 0.0 - 0.1 K/uL  ? Immature Granulocytes 0 %  ? Abs Immature Granulocytes 0.05 0.00 - 0.07 K/uL  ?  Comment: Performed at [facility], [address], [place], [place] [address]  ?Type and screen [facility]     Status: None  ? Collection Time: 01/06/22  5:36 PM  ?Result Value Ref Range  ? ABO/RH(D) A POS   ? Antibody Screen NEG   ? Sample Expiration    ?  01/09/2022,2359 ?Performed at [facility], [address] [place], [place] [address] ?  ?Protime-INR     Status: None  ? Collection Time: 01/06/22  5:36 PM  ?Result Value Ref Range  ? Prothrombin Time 14.5 11.4 - 15.2 seconds  ? INR 1.1 0.8 - 1.2  ?  Comment: (NOTE) ?INR goal varies based on device and disease states. ?Performed at [facility], [place], ?[place] [address] ?  ?APTT     Status: None  ? Collection Time: 01/06/22  5:36 PM  ?Result Value Ref Range  ? aPTT 28 24 - 36 seconds  ?  Comment: Performed at [facility], [address] [place], [place] [address]  ?Troponin I (High Sensitivity)     Status: None  ? Collection Time: 01/06/22  6:46 PM  ?Result Value Ref Range  ? Troponin I (High Sensitivity) 8 <18 ng/L  ?  Comment: (NOTE) ?Elevated high sensitivity troponin I (hsTnI) values and significant  ?changes across serial measurements may suggest ACS but many other  ?chronic and acute conditions are known to elevate hsTnI results.  ?Refer to the "Links" section for chest pain algorithms and additional  ?guidance. ?Performed at [facility], [place], ?[place] [address] ?  ?Brain natriuretic peptide     Status: Abnormal  ? Collection Time: 01/06/22  7:30 PM  ?Result Value Ref Range  ? B Natriuretic Peptide 132.5 (H) 0.0 - 100.0 pg/mL  ?  Comment: Performed at [facility], [address] [place], [place] [address]  ?CK     Status: None  ? Collection Time: 01/06/22  7:30 PM  ?Result Value Ref Range  ? Total CK 171 38 - 234 U/L  ?  Comment: Performed at [facility], [address] [place], [place] [address]  ?Lactic acid, plasma     Status: Abnormal  ? Collection Time: 01/06/22  9:38 PM  ?Result Value Ref Range  ? Lactic Acid, Venous 2.4 (HH) 0.5 - 1.9 mmol/L  ?  Comment: CRITICAL RESULT CALLED TO, READ BACK BY AND VERIFIED WITH ?OGE AMAONWU @2224  ON 01/06/22 SKL ?Performed at [facility], [address], [place], [place] [address] ?  ?Troponin I (High Sensitivity)      Status: None  ? Collection Time: 01/06/22  9:38 PM  ?Result Value Ref Range  ?  Troponin I (High Sensitivity) 7 <18 ng/L  ?  Comment: (NOTE) ?Elevated high sensitivity troponin I (hsTnI) values and significant  ?changes across serial measurements may suggest ACS but many other  ?chronic and acute conditions are known to elevate hsTnI results.  ?Refer to the "Links" section for chest pain algorithms and additional  ?guidance. ?Performed at [facility], [place], ?[place] [address] ?  ?Lactic acid, plasma     Status: Abnormal  ? Collection Time: 01/07/22  1:02 AM  ?Result Value Ref Range  ? Lactic Acid, Venous 2.9 (HH) 0.5 - 1.9 mmol/L  ?  Comment: CRITICAL VALUE NOTED. VALUE IS CONSISTENT WITH PREVIOUSLY REPORTED/CALLED VALUE SKL ?Performed at [facility], [address] [place] [address] [place], [place] [address] ?  ?Basic metabolic panel     Status: Abnormal  ? Collection Time: 01/07/22  3:34 AM  ?Result Value Ref Range  ? Sodium 138 135 - 145 mmol/L  ? Potassium 5.0 3.5 - 5.1 mmol/L  ? Chloride 106 98 - 111 mmol/L  ? CO2 26 22 - 32 mmol/L  ? Glucose, Bld 148 (H) 70 - 99 mg/dL  ?  Comment: Glucose reference range applies only to samples taken after fasting for at least 8 hours.  ? BUN 35 (H) 8 - 23 mg/dL  ? Creatinine, Ser 1.16 (H) 0.44 - 1.00 mg/dL  ? Calcium 8.1 (L) 8.9 - 10.3 mg/dL  ? GFR, Estimated 45 (L) >60 mL/min  ?  Comment: (NOTE) ?Calculated using the CKD-EPI Creatinine Equation (2021) ?  ? Anion gap 6 5 - 15  ?  Comment: Performed at [facility], [address], [place], [place] [address]  ?CBC     Status: Abnormal  ? Collection Time: 01/07/22  3:34 AM  ?Result Value Ref Range  ? WBC 11.2 (H) 4.0 - 10.5 K/uL  ? RBC 2.27 (L) 3.87 - 5.11 MIL/uL  ? Hemoglobin 7.6 (L) 12.0 - 15.0 g/dL  ? HCT 23.9 (L) 36.0 - 46.0 %  ? MCV 105.3 (H) 80.0 - 100.0 fL  ? MCH 33.5 26.0 - 34.0 pg  ? MCHC 31.8 30.0 - 36.0 g/dL  ? RDW 13.5 11.5 - 15.5 %  ? Platelets 142 (L) 150 - 400 K/uL  ? nRBC 0.0  0.0 - 0.2 %  ?  Comment: Performed at [facility], [address] [place], [place] [address]  ?Lactic acid, plasma     Status: Abnormal  ? Collection Time: 01/07/22  3:34 AM  ?Result Value Ref Range

## 2022-01-07 NOTE — Progress Notes (Signed)
Initial Nutrition Assessment ? ?DOCUMENTATION CODES:  ? ?Underweight ? ?INTERVENTION:  ? ?-Once diet is advanced, add: ? ?-Ensure Enlive po BID, each supplement provides 350 kcal and 20 grams of protein ?-MVI with minerals daily ? ?NUTRITION DIAGNOSIS:  ? ?Increased nutrient needs related to post-op healing as evidenced by estimated needs. ? ?GOAL:  ? ?Patient will meet greater than or equal to 90% of their needs ? ?MONITOR:  ? ?PO intake, Supplement acceptance, Diet advancement ? ?REASON FOR ASSESSMENT:  ? ?Consult ?Assessment of nutrition requirement/status, Hip fracture protocol ? ?ASSESSMENT:  ? ?Pt  with no significant past medical history presenting with a fall and pain in the left hip. ? ?Pt admitted with lt hip fracture s/p fall.  ? ?Reviewed I/O's: +301 ml x 24 hours ? ?UOP: 1.4 L x 24 hours  ? ?Per orthopedics notes, plan for ORIF today. Pt currently NPO for procedure.  ? ?Spoke with pt and family members at bedside. Pt reports good appetite PTA. She generally grazes throughout the day and consumes 1 strawberry Ensure supplement daily. She reports she has a family member who is RD who helps her with her food selections. She is very eager for surgery today.  ? ?Per son, pt experienced a 10# wt loss about a year ago, which stabilized over the past 6 months. Reviewed wt hx; pt wt has been stable over the past few years.  ? ?Discussed importance of good meal and supplement intake to promote healing. Pt amenable to supplements.  ? ?Medications reviewed and include colace and lasix.  ? ?Labs reviewed.  ? ?NUTRITION - FOCUSED PHYSICAL EXAM: ? ?Flowsheet Row Most Recent Value  ?Orbital Region No depletion  ?Upper Arm Region Mild depletion  ?Thoracic and Lumbar Region No depletion  ?Buccal Region No depletion  ?Temple Region Mild depletion  ?Clavicle Bone Region Mild depletion  ?Clavicle and Acromion Bone Region Mild depletion  ?Scapular Bone Region Mild depletion  ?Dorsal Hand No depletion  ?Patellar Region No  depletion  ?Anterior Thigh Region No depletion  ?Posterior Calf Region No depletion  ?Edema (RD Assessment) None  ?Hair Reviewed  ?Eyes Reviewed  ?Mouth Reviewed  ?Skin Reviewed  ?Nails Reviewed  ? ?  ? ? ?Diet Order:   ?Diet Order   ? ?       ?  Diet NPO time specified  Diet effective midnight       ?  ? ?  ?  ? ?  ? ? ?EDUCATION NEEDS:  ? ?Education needs have been addressed ? ?Skin:  Skin Assessment: Reviewed RN Assessment ? ?Last BM:  01/06/22 ? ?Height:  ? ?Ht Readings from Last 1 Encounters:  ?01/06/22 5\' 8"  (1.727 m)  ? ? ?Weight:  ? ?Wt Readings from Last 1 Encounters:  ?01/07/22 53.2 kg  ? ? ?Ideal Body Weight:  63.6 kg ? ?BMI:  Body mass index is 17.83 kg/m?. ? ?Estimated Nutritional Needs:  ? ?Kcal:  1400-1600 ? ?Protein:  65-80 grams ? ?Fluid:  > 1.4 L ? ? ? ?03/09/22, RD, LDN, CDCES ?Registered Dietitian II ?Certified Diabetes Care and Education Specialist ?Please refer to [facility] for RD and/or RD on-call/weekend/after hours pager  ?

## 2022-01-07 NOTE — Assessment & Plan Note (Addendum)
Urinalysis and chest x-ray negative.  Does not seem to be related to infection.  ?

## 2022-01-07 NOTE — Assessment & Plan Note (Deleted)
Outpatient hemoglobin in March was 11.3.  CT scan showing some bleeding in the hip and buttock area.  Patient received 3 units of packed red blood cells and IV iron during hospital course.  Recheck hemoglobin tomorrow to make sure stable prior to disposition. ?

## 2022-01-07 NOTE — Op Note (Signed)
01/07/2022 ? ?5:42 PM ? ?PATIENT:  Cassie Solis  88 y.o. female ? ?PRE-OPERATIVE DIAGNOSIS:  Left comminuted intertrochanteric hip fracture ? ?POST-OPERATIVE DIAGNOSIS:  Left comminuted intertrochanteric hip fracture ? ?PROCEDURE:  Procedure(s): ?INTRAMEDULLARY (IM) NAIL INTERTROCHANTRIC (Left) ? ?SURGEON: Laurene Footman, MD ? ?ASSISTANTS: None ? ?ANESTHESIA:   spinal ? ?EBL:  Total I/O ?In: 674.5 [Blood:624.5; IV Piggyback:50] ?Out: 1950 [Urine:1800; Blood:150] ? ?BLOOD ADMINISTERED:none ? ?DRAINS: none  ? ?LOCAL MEDICATIONS USED:  NONE ? ?SPECIMEN:  No Specimen ? ?DISPOSITION OF SPECIMEN:  N/A ? ?COUNTS:  YES ? ?TOURNIQUET:  * No tourniquets in log * ? ?IMPLANTS: Biomet affixes 9 x 340 rod with 105 mm leg screw and 42 mm distal interlocking screw ? ?DICTATION: Viviann Spare Dictation   patient was brought to the operating room and after adequate spinal anesthesia was obtained the  right leg was placed in the leg holder left leg in the traction boot with slight traction applied.  C arm was brought in and good visualization was obtained with anatomic alignment essentially.  The hip was then prepped and draped using a barrier drape method and appropriate patient identification and timeout procedure were completed.  Proximal incision was made centered over the greater trochanter guidewire inserted the tip of the trochanter followed by proximal reaming and placement of the long guidewire.  Measurement was made off of this and reaming is carried out to 11 mm and then 9 mm diameter rod was inserted to the appropriate depth.  Using the targeting device a lateral incision was made and a guidewire placed in the center center of the head measured drilled and the 105 mm leg screw inserted with traction released and compression applied with good reduction of the fracture.  The setscrew was placed proximally tightening and a quarter turn off to allow for further compression and the insertion handle was removed with AP lateral images  obtained.  The leg abducted the distal interlocking oblique hole was filled using standard technique making a small incision drilling measuring and placing the bicortical screw.  The wounds were irrigated with #1 Vicryl to close deep fascia 2-0 Vicryl suture for subcutaneous tissue with skin staples and honeycomb dressings applied. ?  ? ?PLAN OF CARE: Admit to inpatient  ? ?PATIENT DISPOSITION:  PACU - hemodynamically stable. ?  ? ?

## 2022-01-07 NOTE — Anesthesia Preprocedure Evaluation (Signed)
Anesthesia Evaluation  ?Patient identified by MRN, date of birth, ID band ?Patient awake ? ? ? ?Reviewed: ?Allergy & Precautions, NPO status , Patient's Chart, lab work & pertinent test results ? ?History of Anesthesia Complications ?Negative for: history of anesthetic complications ? ?Airway ?Mallampati: II ? ?TM Distance: >3 FB ?Neck ROM: Full ? ? ? Dental ? ?(+) Chipped ?  ?Pulmonary ?neg pulmonary ROS, neg sleep apnea, neg COPD, Patient abstained from smoking.Not current smoker,  ?  ?Pulmonary exam normal ?breath sounds clear to auscultation ? ? ? ? ? ? Cardiovascular ?Exercise Tolerance: Good ?METS(-) hypertension(-) CAD and (-) Past MI negative cardio ROS ? ?(-) dysrhythmias  ?Rhythm:Regular Rate:Normal ?- Systolic murmurs ? ?  ?Neuro/Psych ?negative neurological ROS ? negative psych ROS  ? GI/Hepatic ?neg GERD  ,(+)  ?  ? (-) substance abuse ? ,   ?Endo/Other  ?neg diabetes ? Renal/GU ?CRFRenal disease  ? ?  ?Musculoskeletal ? ?(+) Arthritis ,  ? Abdominal ?  ?Peds ? Hematology ? ?(+) Blood dyscrasia, anemia ,   ?Anesthesia Other Findings ?Past Medical History: ?No date: Arthritis ?    Comment:  left wrist, bilateral hips ?No date: Osteoporosis ?No date: Seasonal allergies ?No date: Vertigo ?    Comment:  rare ? Reproductive/Obstetrics ? ?  ? ? ? ? ? ? ? ? ? ? ? ? ? ?  ?  ? ? ? ? ? ? ? ? ?Anesthesia Physical ?Anesthesia Plan ? ?ASA: 2 ? ?Anesthesia Plan: Spinal  ? ?Post-op Pain Management: Ofirmev IV (intra-op)*  ? ?Induction: Intravenous ? ?PONV Risk Score and Plan: 2 and Ondansetron, Dexamethasone, Propofol infusion, TIVA and Treatment may vary due to age or medical condition ? ?Airway Management Planned: Natural Airway ? ?Additional Equipment: None ? ?Intra-op Plan:  ? ?Post-operative Plan:  ? ?Informed Consent: I have reviewed the patients History and Physical, chart, labs and discussed the procedure including the risks, benefits and alternatives for the proposed  anesthesia with the patient or authorized representative who has indicated his/her understanding and acceptance.  ? ? ? ? ? ?Plan Discussed with: CRNA and Surgeon ? ?Anesthesia Plan Comments: (Discussed R/B/A of neuraxial anesthesia technique with patient and son at bedside: ?- rare risks of spinal/epidural hematoma, nerve damage, infection ?- Risk of PDPH ?- Risk of nausea and vomiting ?- Risk of conversion to general anesthesia and its associated risks, including sore throat, damage to lips/eyes/teeth/oropharynx, and rare risks such as cardiac and respiratory events. ?- Risk of allergic reactions ?- post operative cognitive dysfunction ? ? ?Discussed the role of CRNA in patient's perioperative care. ? ?Patient and son voiced understanding.)  ? ? ? ? ? ? ?Anesthesia Quick Evaluation ? ?

## 2022-01-07 NOTE — Assessment & Plan Note (Addendum)
Creatinine 1.26 on presentation with IV fluids came down to 0.94.  ?

## 2022-01-07 NOTE — TOC Progression Note (Signed)
Transition of Care (TOC) - Progression Note  ? ? ?Patient Details  ?Name: Cassie Solis ?MRN: [id number] ?Date of Birth: [birth year] ? ?Transition of Care (TOC) CM/SW Contact  ?Marlowe Sax, RN ?Phone Number: ?01/07/2022, 2:20 PM ? ?Clinical Narrative:    ? ?PASSR number [id number] A ? ?  ?  ? ?Expected Discharge Plan and Services ?  ?  ?  ?  ?  ?                ?  ?  ?  ?  ?  ?  ?  ?  ?  ?  ? ? ?Social Determinants of Health (SDOH) Interventions ?  ? ?Readmission Risk Interventions ?   ? View : No data to display.  ?  ?  ?  ? ? ?

## 2022-01-07 NOTE — Progress Notes (Signed)
?  Progress Note ? ? ?Patient: Cassie Solis [id number] DOB: 1929-01-03 DOA: 01/06/2022     0 ?DOS: the patient was seen and examined on 01/07/2022 ? ? ?Assessment and Plan: ?* Closed left hip fracture ([place]) ?Patient had a mechanical fall and found to have a closed left intertrochanteric fracture of the left femur with slight lateral angulation.  Patient to go to the operating room today by Dr. Rudene Christians. ? ?Iron deficiency anemia ?Patient's hemoglobin dropped from 9.3 down to 7.6 with IV fluid hydration.  We will transfuse 1 unit of packed red blood cells since going to the operating room today and recheck tomorrow. ? ?Lactic acidosis ?Urinalysis and chest x-ray negative.  Does not seem to be related to infection.  Continue IV fluids. ? ?Stage 3b chronic kidney disease (CKD) ([place]) ?Creatinine 1.26 on presentation with IV fluids came down to 1.16.  Continue to monitor creatinine.  GFR today 45. ? ?Sinus bradycardia ?Not on any rate controlling medications. ? ?Hypotension ?Initial hypotension resolved with IV fluids. ? ? ? ? ? ?  ? ?Subjective: Patient had a mechanical fall outside and was on the ground for couple hours before family saw her.  Found to have a left femur fracture. ? ?Physical Exam: ?Vitals:  ? 01/07/22 1018 01/07/22 1046 01/07/22 1112 01/07/22 1311  ?BP: 125/60 124/60 (!) 116/57 126/69  ?Pulse: 73 70 71 70  ?Resp: 15 16 17 17   ?Temp: 98.3 ?F (36.8 ?C) 98.8 ?F (37.1 ?C) 98.3 ?F (36.8 ?C) 98.7 ?F (37.1 ?C)  ?TempSrc: Oral Oral    ?SpO2: 97% 96% 96% 97%  ?Weight:      ?Height:      ? ?Physical Exam ?HENT:  ?   Head: Normocephalic.  ?   Mouth/Throat:  ?   Pharynx: No oropharyngeal exudate.  ?Eyes:  ?   General: Lids are normal.  ?   Conjunctiva/sclera: Conjunctivae normal.  ?Cardiovascular:  ?   Rate and Rhythm: Normal rate and regular rhythm.  ?   Heart sounds: Normal heart sounds, S1 normal and S2 normal.  ?Pulmonary:  ?   Breath sounds: Normal breath sounds. No decreased breath sounds, wheezing, rhonchi or  rales.  ?Abdominal:  ?   Palpations: Abdomen is soft.  ?   Tenderness: There is no abdominal tenderness.  ?Musculoskeletal:  ?   Right lower leg: No swelling.  ?   Left lower leg: No swelling.  ?Skin: ?   General: Skin is warm.  ?   Comments: Bruising on left forehead, bruises seen on arms.  ?Neurological:  ?   Mental Status: She is alert and oriented to person, place, and time.  ?   Comments: Able to flex and extend bilateral ankles.  ?  ?Data Reviewed: ?Lactic acid 2.5, hemoglobin 7.6, white blood cell count 11.2, platelet count 142, ferritin 54, creatinine 1.16 with a GFR of 45 ? ?Family Communication: Spoke with son at the bedside ? ?Disposition: ?Status is: Inpatient ?Remains inpatient appropriate because: Will go to the operating room for left hip fracture today ? ?Planned Discharge Destination: Rehab ? ?Author: ?Loletha Grayer, MD ?01/07/2022 1:25 PM ? ?For on call review [email or web address].  ?

## 2022-01-07 NOTE — Transfer of Care (Signed)
Immediate Anesthesia Transfer of Care Note ? ?Patient: Cassie Solis ? ?Procedure(s) Performed: INTRAMEDULLARY (IM) NAIL INTERTROCHANTRIC (Left: Hip) ? ?Patient Location: PACU ? ?Anesthesia Type:Spinal ? ?Level of Consciousness: awake and alert  ? ?Airway & Oxygen Therapy: Patient Spontanous Breathing ? ?Post-op Assessment: Report given to RN and Post -op Vital signs reviewed and stable ? ?Post vital signs: Reviewed and stable ? ?Last Vitals:  ?Vitals Value Taken Time  ?BP 128/109 01/07/22 1801  ?Temp 36.8 ?C 01/07/22 1745  ?Pulse 79 01/07/22 1803  ?Resp 12 01/07/22 1803  ?SpO2 99 % 01/07/22 1803  ?Vitals shown include unvalidated device data. ? ?Last Pain:  ?Vitals:  ? 01/07/22 1522  ?TempSrc: Temporal  ?PainSc: 0-No pain  ?   ? ?Patients Stated Pain Goal: 2 (01/06/22 2200) ? ?Complications: No notable events documented. ?

## 2022-01-07 NOTE — Anesthesia Procedure Notes (Addendum)
Spinal ? ?Patient location during procedure: OR ?Start time: 01/07/2022 4:40 PM ?End time: 01/07/2022 4:45 PM ?Reason for block: surgical anesthesia ?Staffing ?Performed: resident/CRNA  ?Resident/CRNA: [name], CRNA ?Preanesthetic Checklist ?Completed: patient identified, IV checked, site marked, risks and benefits discussed, surgical consent, monitors and equipment checked and pre-op evaluation ?Spinal Block ?Patient position: right lateral decubitus ?Prep: ChloraPrep ?Patient monitoring: heart rate, cardiac monitor, continuous pulse ox and blood pressure ?Approach: midline ?Location: L3-4 ?Injection technique: single-shot ?Needle ?Needle type: Pencan  ?Needle gauge: 25 G ?Needle length: 9 cm ?Assessment ?Sensory level: T4 ?Events: CSF return ? ? ? ?

## 2022-01-08 ENCOUNTER — Encounter: Payer: Self-pay | Admitting: Orthopedic Surgery

## 2022-01-08 DIAGNOSIS — D509 Iron deficiency anemia, unspecified: Secondary | ICD-10-CM | POA: Diagnosis not present

## 2022-01-08 DIAGNOSIS — R001 Bradycardia, unspecified: Secondary | ICD-10-CM | POA: Diagnosis not present

## 2022-01-08 DIAGNOSIS — E872 Acidosis, unspecified: Secondary | ICD-10-CM | POA: Diagnosis not present

## 2022-01-08 DIAGNOSIS — S72002S Fracture of unspecified part of neck of left femur, sequela: Secondary | ICD-10-CM | POA: Diagnosis not present

## 2022-01-08 LAB — CBC
HCT: 20.9 % — ABNORMAL LOW (ref 36.0–46.0)
Hemoglobin: 6.9 g/dL — ABNORMAL LOW (ref 12.0–15.0)
MCH: 33.8 pg (ref 26.0–34.0)
MCHC: 33 g/dL (ref 30.0–36.0)
MCV: 102.5 fL — ABNORMAL HIGH (ref 80.0–100.0)
Platelets: 117 10*3/uL — ABNORMAL LOW (ref 150–400)
RBC: 2.04 MIL/uL — ABNORMAL LOW (ref 3.87–5.11)
RDW: 15.8 % — ABNORMAL HIGH (ref 11.5–15.5)
WBC: 9.6 10*3/uL (ref 4.0–10.5)
nRBC: 0 % (ref 0.0–0.2)

## 2022-01-08 LAB — BASIC METABOLIC PANEL
Anion gap: 4 — ABNORMAL LOW (ref 5–15)
BUN: 33 mg/dL — ABNORMAL HIGH (ref 8–23)
CO2: 30 mmol/L (ref 22–32)
Calcium: 8 mg/dL — ABNORMAL LOW (ref 8.9–10.3)
Chloride: 104 mmol/L (ref 98–111)
Creatinine, Ser: 1.16 mg/dL — ABNORMAL HIGH (ref 0.44–1.00)
GFR, Estimated: 45 mL/min — ABNORMAL LOW (ref >60)
Glucose, Bld: 134 mg/dL — ABNORMAL HIGH (ref 70–99)
Potassium: 4.5 mmol/L (ref 3.5–5.1)
Sodium: 138 mmol/L (ref 135–145)

## 2022-01-08 LAB — VITAMIN B12: Vitamin B-12: 390 pg/mL (ref 180–914)

## 2022-01-08 LAB — PREPARE RBC (CROSSMATCH)

## 2022-01-08 MED ORDER — CHLORHEXIDINE GLUCONATE CLOTH 2 % EX PADS: 6.0000 | MEDICATED_PAD | Freq: Every day | CUTANEOUS | Status: DC

## 2022-01-08 MED ORDER — SODIUM CHLORIDE 0.9% IV SOLUTION: Freq: Once | INTRAVENOUS | Status: AC

## 2022-01-08 MED ORDER — FUROSEMIDE 10 MG/ML IJ SOLN
40.0000 mg | Freq: Once | INTRAMUSCULAR | Status: AC
  Administered 2022-01-08: 40 mg via INTRAVENOUS
  Filled 2022-01-08: qty 4

## 2022-01-08 MED ORDER — ENOXAPARIN SODIUM 30 MG/0.3ML IJ SOSY
30.0000 mg | PREFILLED_SYRINGE | INTRAMUSCULAR | Status: DC
  Administered 2022-01-08 – 2022-01-09 (×2): 30 mg via SUBCUTANEOUS
  Filled 2022-01-08 (×2): qty 0.3

## 2022-01-08 NOTE — Plan of Care (Signed)
SCD was applied, and TED hose at some point. She received some pain medication and milk of Magnesia as well. She was repositioned couple of times as tolerated. ?

## 2022-01-08 NOTE — Progress Notes (Signed)
Foley removed per order at 0823, patient due to void by 1423. ?

## 2022-01-08 NOTE — Progress Notes (Signed)
? ?  Subjective: ?1 Day Post-Op Procedure(s) (LRB): ?INTRAMEDULLARY (IM) NAIL INTERTROCHANTRIC (Left) ?Patient reports pain as mild.   ?Patient is well, and has had no acute complaints or problems ?Denies any CP, SOB, ABD pain. ?We will continue therapy today.  ? ?Objective: ?Vital signs in last 24 hours: ?Temp:  [97.7 ?F (36.5 ?C)-98.8 ?F (37.1 ?C)] 98.4 ?F (36.9 ?C) (05/12 0309) ?Pulse Rate:  [50-85] 50 (05/12 0309) ?Resp:  [8-17] 16 (05/12 0309) ?BP: (103-160)/(53-94) 132/60 (05/12 0309) ?SpO2:  [92 %-100 %] 95 % (05/12 0309) ? ?Intake/Output from previous day: ?05/11 0701 - 05/12 0700 ?In: 2211.7 [P.O.:120; I.V.:1367.2; Blood:624.5; IV Piggyback:100] ?Out: 2100 [Urine:1950; Blood:150] ?Intake/Output this shift: ?No intake/output data recorded. ? ?Recent Labs  ?  01/06/22 ?1736 01/07/22 ?[id number] 01/07/22 ?2030 01/08/22 ?[id number]  ?HGB 9.3* 7.6* 7.8* 6.9*  ? ?Recent Labs  ?  01/07/22 ?2030 01/08/22 ?[id number]  ?WBC 10.9* 9.6  ?RBC 2.30* 2.04*  ?HCT 23.0* 20.9*  ?PLT 131* 117*  ? ?Recent Labs  ?  01/07/22 ?[id number] 01/07/22 ?2030 01/08/22 ?[id number]  ?NA 138  --  138  ?K 5.0  --  4.5  ?CL 106  --  104  ?CO2 26  --  30  ?BUN 35*  --  33*  ?CREATININE 1.16* 1.14* 1.16*  ?GLUCOSE 148*  --  134*  ?CALCIUM 8.1*  --  8.0*  ? ?Recent Labs  ?  01/06/22 ?1736  ?INR 1.1  ? ? ?EXAM ?General - Patient is Alert, Appropriate, and Oriented ?Extremity - Neurovascular intact ?Sensation intact distally ?Intact pulses distally ?Dorsiflexion/Plantar flexion intact ?No cellulitis present ?Compartment soft ?Dressing - dressing C/D/I and scant drainage ?Motor Function - intact, moving foot and toes well on exam.  ? ?Past Medical History:  ?Diagnosis Date  ? 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 Osteoporosis   ? Seasonal allergies   ? Vertigo   ? rare  ? ? ?Assessment/Plan:   ?1 Day Post-Op Procedure(s) (LRB): ?INTRAMEDULLARY (IM) NAIL INTERTROCHANTRIC (Left) ?Principal Problem: ?  Closed left hip fracture ([place]) ?Active Problems: ?  Hypotension ?  Sinus  bradycardia ?  Femur fracture, left ([place]) ?  Iron deficiency anemia ?  Stage 3b chronic kidney disease (CKD) ([place]) ?  Lactic acidosis ? ?Estimated body mass index is 17.83 kg/m? as calculated from the following: ?  Height as of this encounter: 5\' 8"  (1.727 m). ?  Weight as of this encounter: 53.2 kg. ?Advance diet ?Up with therapy, WBAT ?Work on [employer] ?VSS ?Acute post op blood loss anemia - Hgb 6.9, Transfuse 1 unit of PRBC and recheck Labs in the am ?CM to assist with discharge, likely SNF. ? ? ?DVT Prophylaxis - Lovenox, TED hose, and SCDs ?Weight-Bearing as tolerated to left leg ? ? ?T. Rachelle Hora, PA-C ?[place] ?01/08/2022, 7:18 AM ?  ?

## 2022-01-08 NOTE — TOC Initial Note (Signed)
Transition of Care (TOC) - Initial/Assessment Note  ? ? ?Patient Details  ?Name: Cassie Solis ?MRN: [id number] ?Date of Birth: Dec 31, 1928 ? ?Transition of Care (TOC) CM/SW Contact:    ?Cordaryl Decelles E Tenise Stetler, LCSW ?Phone Number: ?01/08/2022, 1:46 PM ? ?Clinical Narrative:                CSW spoke with patient regarding SNF recommendation. ?Patient lives alone. Family provides transportation. PCP is Dr. Lisbeth Ply in [place]. Pharmacy is in [place], patient unsure which one. Patient declines SNF or DME history.  ?Patient is agreeable to SNF. Work up started.  ? ? ?Expected Discharge Plan: [place] ?Barriers to Discharge: Continued Medical Work up ? ? ?Patient Goals and CMS Choice ?Patient states their goals for this hospitalization and ongoing recovery are:: SNF ?CMS Medicare.gov Compare Post Acute Care list provided to:: Patient ?Choice offered to / list presented to : Patient ? ?Expected Discharge Plan and Services ?Expected Discharge Plan: [place] ?  ?Discharge Planning Services: CM Consult ?  ?Living arrangements for the past 2 months: [place] ?                ?  ?  ?  ?  ?  ?  ?  ?  ?  ?  ? ?Prior Living Arrangements/Services ?Living arrangements for the past 2 months: [place] ?Lives with:: Self ?Patient language and need for interpreter reviewed:: Yes ?Do you feel safe going back to the place where you live?: Yes      ?Need for Family Participation in Patient Care: Yes (Comment) ?Care giver support system in place?: Yes (comment) ?  ?Criminal Activity/Legal Involvement Pertinent to Current Situation/Hospitalization: No - Comment as needed ? ?Activities of Daily Living ?Home Assistive Devices/Equipment: None ?ADL Screening (condition at time of admission) ?Patient's cognitive ability adequate to safely complete daily activities?: Yes ?Is the patient deaf or have difficulty hearing?: No ?Does the patient have difficulty seeing, even when wearing glasses/contacts?:  No ?Does the patient have difficulty concentrating, remembering, or making decisions?: No ?Patient able to express need for assistance with ADLs?: Yes ?Does the patient have difficulty dressing or bathing?: No ?Independently performs ADLs?: Yes (appropriate for developmental age) ?Does the patient have difficulty walking or climbing stairs?: No ?Weakness of Legs: None ?Weakness of Arms/Hands: None ? ?Permission Sought/Granted ?Permission sought to share information with : Customer service manager ?Permission granted to share information with : Yes, Verbal Permission Granted ?   ? Permission granted to share info w AGENCY: SNFs ?   ?   ? ?Emotional Assessment ?  ?  ?  ?Orientation: : Oriented to Self, Oriented to Place, Oriented to  Time, Oriented to Situation ?Alcohol / Substance Use: Not Applicable ?Psych Involvement: No (comment) ? ?Admission diagnosis:  Closed left hip fracture ([place]) [S72.002A] ?Closed fracture of left hip, initial encounter ([place]) [S72.002A] ?Fall, initial encounter [W19.XXXA] ?Femur fracture, left ([place]) [S72.92XA] ?Patient Active Problem List  ? Diagnosis Date Noted  ? Femur fracture, left ([place]) 01/07/2022  ? Iron deficiency anemia 01/07/2022  ? Stage 3b chronic kidney disease (CKD) ([place]) 01/07/2022  ? Lactic acidosis 01/07/2022  ? Closed hip fracture requiring operative repair, left, sequela 01/06/2022  ? Hypotension 01/06/2022  ? Sinus bradycardia 01/06/2022  ? ?PCP:  Pcp, No ?Pharmacy:  No Pharmacies Listed ? ? ? ?Social Determinants of Health (SDOH) Interventions ?  ? ?Readmission Risk Interventions ? ?  01/08/2022  ?  1:46 PM  ?Readmission Risk Prevention  Plan  ?Transportation Screening Complete  ?PCP or Specialist Appt within 5-7 Days Complete  ?Home Care Screening Complete  ?Medication Review (RN CM) Complete  ? ? ? ?

## 2022-01-08 NOTE — Evaluation (Signed)
Occupational Therapy Evaluation ?Patient Details ?Name: Cassie GarnerSadie L Solis ?MRN: [id number] ?DOB: September 27, 1928 ?Today's Date: 01/08/2022 ? ? ?History of Present Illness Pt is a 88 y/o F admitted on 01/06/22 after presenting with c/o fal & L hip pain. X ray revealed xray comminuted intertrochanteric fracture of the left femur with slight lateral angulation. Pt underwent sx for L IM nail placement by Dr. Rosita KeaMenz on 01/07/22. PMH: vertigo, osteoporosis, arthritis  ? ?Clinical Impression ?  ?Chart reviewed, pt agreeable for OT evaluation. Co tx completed with PT. PTA pt was MOD I-I in all ADL/IADL with assist from children who live next door as needed. Pt does not drive. At this time pt presents with deficits in endurance, strength, activity tolerance all affecting safe and optimal ADL task completion. Pt requires MAX A +2 for SPT, MOD A +2 for squat pivot transfer. Toileting completed with MIN A. When pt attempted SPT +2 with RW, pt not responding to therapist questioning, and returned to bed with MAX A +2. BP stable, reports she felt in too much pain to speak. Pt transfers to chair with MOD A +2 via squat pivot, does not endorse dizziness. At this time recommend discharge to STR to address functional deficits. Pt and son are in agreement.  ? ?BP in sitting 98/53 HR 78, BP in standing 97/56, BP in chair 126/62 ?   ? ?Recommendations for follow up therapy are one component of a multi-disciplinary discharge planning process, led by the attending physician.  Recommendations may be updated based on patient status, additional functional criteria and insurance authorization.  ? ?Follow Up Recommendations ? Skilled nursing-short term rehab (<3 hours/day)  ?  ?Assistance Recommended at Discharge Frequent or constant Supervision/Assistance  ?Patient can return home with the following A lot of help with walking and/or transfers;A lot of help with bathing/dressing/bathroom;Assistance with cooking/housework;Help with stairs or ramp for  entrance ? ?  ?Functional Status Assessment ? Patient has had a recent decline in their functional status and demonstrates the ability to make significant improvements in function in a reasonable and predictable amount of time.  ?Equipment Recommendations ? BSC/3in1  ?  ?Recommendations for Other Services   ? ? ?  ?Precautions / Restrictions Precautions ?Precautions: Fall ?Restrictions ?Weight Bearing Restrictions: Yes ?LLE Weight Bearing: Weight bearing as tolerated  ? ?  ? ?Mobility Bed Mobility ?Overal bed mobility: Needs Assistance ?Bed Mobility: Supine to Sit, Sit to Supine ?  ?  ?Supine to sit: Max assist, HOB elevated ?Sit to supine: Max assist, +2 for physical assistance, HOB elevated ?  ?  ?  ? ?Transfers ?Overall transfer level: Needs assistance ?Equipment used: Rolling walker (2 wheels) ?Transfers: Sit to/from Stand, Bed to chair/wheelchair/BSC ?Sit to Stand: Max assist, +2 physical assistance ?Stand pivot transfers: Max assist, +2 physical assistance ?Squat pivot transfers: Mod assist, +2 physical assistance ?  ?  ?  ?  ?  ? ?  ?Balance Overall balance assessment: Needs assistance ?Sitting-balance support: Feet supported, Bilateral upper extremity supported ?Sitting balance-Leahy Scale: Poor ?Sitting balance - Comments: vcs due to R lateral lean ?  ?  ?Standing balance-Leahy Scale: Poor ?  ?  ?  ?  ?  ?  ?  ?  ?  ?  ?  ?  ?   ? ?ADL either performed or assessed with clinical judgement  ? ?ADL Overall ADL's : Needs assistance/impaired ?Eating/Feeding: Sitting;Set up ?  ?Grooming: Wash/dry hands;Sitting;Set up ?  ?  ?  ?  ?  ?  ?  ?  Lower Body Dressing: Maximal assistance ?Lower Body Dressing Details (indicate cue type and reason): socks at edge of bed ?Toilet Transfer: Moderate assistance;+2 for physical assistance;BSC/3in1;Squat-pivot ?  ?Toileting- Clothing Manipulation and Hygiene: Minimal assistance;Sit to/from stand ?Toileting - Clothing Manipulation Details (indicate cue type and reason): for peri  care ?  ?  ?  ?   ? ? ? ?Vision Patient Visual Report: No change from baseline ?   ?   ?Perception   ?  ?Praxis   ?  ? ?Pertinent Vitals/Pain Pain Assessment ?Pain Assessment: Faces ?Faces Pain Scale: Hurts whole lot ?Pain Descriptors / Indicators: Discomfort, Grimacing ?Pain Intervention(s): Monitored during session, Repositioned  ? ? ? ?Hand Dominance   ?  ?Extremity/Trunk Assessment Upper Extremity Assessment ?Upper Extremity Assessment: Overall WFL for tasks assessed ?  ?Lower Extremity Assessment ?Lower Extremity Assessment: Generalized weakness ?  ?  ?  ?Communication Communication ?Communication: No difficulties ?  ?Cognition Arousal/Alertness: Awake/alert ?Behavior During Therapy: [facility] for tasks assessed/performed ?Overall Cognitive Status: Within Functional Limits for tasks assessed ?  ?  ?  ?  ?  ?  ?  ?  ?  ?  ?  ?  ?  ?  ?  ?  ?General Comments: good awareness of deficits and current situation, slow processing with pain ?  ?  ?General Comments  Pt with continent void on BSC. ? ?  ?Exercises   ?  ?Shoulder Instructions    ? ? ?Home Living Family/patient expects to be discharged to:: Private residence ?Living Arrangements: Alone ?Available Help at Discharge: Family;Available PRN/intermittently ?Type of Home: House ?Home Access: Ramped entrance ?  ?  ?Home Layout: One level ?  ?  ?Bathroom Shower/Tub: Tub/shower unit ?  ?  ?  ?  ?Home Equipment: Shower seat ?  ?  ?  ? ?  ?Prior Functioning/Environment Prior Level of Function : Independent/Modified Independent ?  ?  ?  ?  ?  ?  ?Mobility Comments: ind without AD ?ADLs Comments: MOD I-I in all ADL; children who live next door assist with driving; pt reports she cooks and cleans for herself and goes to the grocery store with her daugther ?  ? ?  ?  ?OT Problem List: Decreased strength;Impaired balance (sitting and/or standing);Decreased activity tolerance;Decreased knowledge of use of DME or AE ?  ?   ?OT Treatment/Interventions: Self-care/ADL  training;Therapeutic exercise;Patient/family education;DME and/or AE instruction;Therapeutic activities;Balance training  ?  ?OT Goals(Current goals can be found in the care plan section) Acute Rehab OT Goals ?Patient Stated Goal: go to rehab ?OT Goal Formulation: With patient/family ?Time For Goal Achievement: 01/22/22 ?Potential to Achieve Goals: Good ?ADL Goals ?Pt Will Perform Grooming: standing;with min guard assist ?Pt Will Perform Lower Body Dressing: sit to/from stand;with min guard assist ?Pt Will Transfer to Toilet: with min guard assist;bedside commode;ambulating ?Pt Will Perform Toileting - Clothing Manipulation and hygiene: with min guard assist;sit to/from stand  ?OT Frequency:   ?  ? ?Co-evaluation PT/OT/SLP Co-Evaluation/Treatment: Yes ?Reason for Co-Treatment: For patient/therapist safety;To address functional/ADL transfers ?PT goals addressed during session: Mobility/safety with mobility;Balance;Proper use of DME ?OT goals addressed during session: ADL's and self-care;Proper use of Adaptive equipment and DME ?  ? ?  ?AM-PAC OT "6 Clicks" Daily Activity     ?Outcome Measure Help from another person eating meals?: None ?Help from another person taking care of personal grooming?: A Little ?Help from another person toileting, which includes using toliet, bedpan, or urinal?: A Little ?Help from another person  bathing (including washing, rinsing, drying)?: A Lot ?Help from another person to put on and taking off regular upper body clothing?: A Little ?Help from another person to put on and taking off regular lower body clothing?: A Lot ?6 Click Score: 17 ?  ?End of Session Equipment Utilized During Treatment: Gait belt;Rolling walker (2 wheels) ?Nurse Communication: Mobility status ? ?Activity Tolerance: Patient tolerated treatment well ?Patient left: in chair;with call bell/phone within reach;with chair alarm set;with family/visitor present ? ?OT Visit Diagnosis: Unsteadiness on feet (R26.81);History of  falling (Z91.81);Muscle weakness (generalized) (M62.81)  ?              ?Time: 2025-4270 ?OT Time Calculation (min): 20 min ?Charges:  OT General Charges ?$OT Visit: 1 Visit ?OT Evaluation ?$OT Eval Moderate Complexity: 1 Mod ?

## 2022-01-08 NOTE — Anesthesia Postprocedure Evaluation (Signed)
Anesthesia Post Note ? ?Patient: Cassie Solis ? ?Procedure(s) Performed: INTRAMEDULLARY (IM) NAIL INTERTROCHANTRIC (Left: Hip) ? ?Patient location during evaluation: Nursing Unit ?Anesthesia Type: Spinal ?Level of consciousness: oriented and awake and alert ?Pain management: pain level controlled ?Vital Signs Assessment: post-procedure vital signs reviewed and stable ?Respiratory status: spontaneous breathing and respiratory function stable ?Cardiovascular status: blood pressure returned to baseline and stable ?Postop Assessment: no headache, no backache, no apparent nausea or vomiting and patient able to bend at knees ?Anesthetic complications: no ? ? ?No notable events documented. ? ? ?Last Vitals:  ?Vitals:  ? 01/07/22 2013 01/08/22 0309  ?BP: 128/69 132/60  ?Pulse: 80 (!) 50  ?Resp: 16 16  ?Temp: 36.6 ?C 36.9 ?C  ?SpO2: 97% 95%  ?  ?Last Pain:  ?Vitals:  ? 01/08/22 0508  ?TempSrc:   ?PainSc: 0-No pain  ? ? ?  ?  ?  ?  ?  ?  ? ?[name] ? ? ? ? ?

## 2022-01-08 NOTE — Progress Notes (Signed)
Physical Therapy Treatment ?Patient Details ?Name: Cassie Solis ?MRN: [id number] ?DOB: 08/14/1929 ?Today's Date: 01/08/2022 ? ? ?History of Present Illness Pt is a 88 y/o F admitted on 01/06/22 after presenting with c/o fal & L hip pain. X ray revealed xray comminuted intertrochanteric fracture of the left femur with slight lateral angulation. Pt underwent sx for L IM nail placement by Dr. Rosita Kea on 01/07/22. PMH: vertigo, osteoporosis, arthritis ? ?  ?PT Comments  ? ? Pt seen for PT tx with pt received in bed reporting need to void. Pt is able to complete squat pivot to R chair>BSC without AD & mod assist. After continent void on [facility] pt requires mod assist for STS with cuing for upright posture & hand placement. Pt attempts to take ~1 step backwards towards EOB but with great difficulty weight bearing through LLE. Pt engages in LLE strengthening exercises while supine with AAROM. Pt continues to be greatly limited by pain in LLE with mobility but is motivated to participate & progress. ?  ?Recommendations for follow up therapy are one component of a multi-disciplinary discharge planning process, led by the attending physician.  Recommendations may be updated based on patient status, additional functional criteria and insurance authorization. ? ?Follow Up Recommendations ? Skilled nursing-short term rehab (<3 hours/day) ?  ?  ?Assistance Recommended at Discharge Frequent or constant Supervision/Assistance  ?Patient can return home with the following A lot of help with walking and/or transfers;A lot of help with bathing/dressing/bathroom;Assistance with cooking/housework;Direct supervision/assist for medications management;Help with stairs or ramp for entrance ?  ?Equipment Recommendations ? Rolling walker (2 wheels);BSC/3in1  ?  ?Recommendations for Other Services   ? ? ?  ?Precautions / Restrictions Precautions ?Precautions: Fall ?Restrictions ?Weight Bearing Restrictions: Yes ?LLE Weight Bearing: Weight bearing as  tolerated  ?  ? ?Mobility ? Bed Mobility ?Overal bed mobility: Needs Assistance ?Bed Mobility: Sit to Supine ?  ?  ?Supine to sit: Max assist, HOB elevated (use of bed rails) ?Sit to supine: Max assist, HOB elevated (assistance to elevate LLE onto bed) ?  ?  ?  ? ?Transfers ?Overall transfer level: Needs assistance ?Equipment used: Rolling walker (2 wheels) ?Transfers: Sit to/from Stand, Bed to chair/wheelchair/BSC ?Sit to Stand: Mod assist (cuing for hand placement & to upright trunk vs forward flexed) ?Stand pivot transfers: Max assist, +2 physical assistance ?  ?Squat pivot transfers: Mod assist (squat pivot recliner>BSC without AD with cuing for hand placement and mod assist +1) ?  ?  ?  ?  ? ?Ambulation/Gait ?  ?  ?  ?  ?  ?  ?  ?  ? ? ?Stairs ?  ?  ?  ?  ?  ? ? ?Wheelchair Mobility ?  ? ?Modified Rankin (Stroke Patients Only) ?  ? ? ?  ?Balance Overall balance assessment: Needs assistance ?Sitting-balance support: Feet supported, Bilateral upper extremity supported ?Sitting balance-Leahy Scale: Fair ?Sitting balance - Comments: back supported, supervision for static sitting on BSC ?  ?Standing balance support: Bilateral upper extremity supported, During functional activity ?Standing balance-Leahy Scale: Poor ?  ?  ?  ?  ?  ?  ?  ?  ?  ?  ?  ?  ?  ? ?  ?Cognition Arousal/Alertness: Awake/alert ?Behavior During Therapy: [facility] for tasks assessed/performed ?Overall Cognitive Status: Within Functional Limits for tasks assessed ?  ?  ?  ?  ?  ?  ?  ?  ?  ?  ?  ?  ?  ?  ?  ?  ?  General Comments: sweet lady ?  ?  ? ?  ?Exercises General Exercises - Lower Extremity ?Heel Slides: AAROM, Left, 10 reps, Supine ?Hip ABduction/ADduction: AAROM, Strengthening, Left, 10 reps, Supine ?Straight Leg Raises: AAROM, Strengthening, Left, 10 reps, Supine ? ?  ?General Comments General comments (skin integrity, edema, etc.): Pt with continent void on BSC ?  ?  ? ?Pertinent Vitals/Pain Pain Assessment ?Pain Assessment: Faces ?Faces Pain  Scale: Hurts whole lot ?Pain Location: L hip ?Pain Descriptors / Indicators: Grimacing, Discomfort ?Pain Intervention(s): Patient requesting pain meds-RN notified, Monitored during session  ? ? ?Home Living Family/patient expects to be discharged to:: Private residence ?Living Arrangements: Alone ?Available Help at Discharge: Family;Available PRN/intermittently ?Type of Home: House ?Home Access: Ramped entrance ?  ?  ?  ?Home Layout: One level ?Home Equipment: Shower seat ?   ?  ?Prior Function    ?  ?  ?   ? ?PT Goals (current goals can now be found in the care plan section) Acute Rehab PT Goals ?Patient Stated Goal: decreased pain ?PT Goal Formulation: With patient ?Time For Goal Achievement: 01/22/22 ?Potential to Achieve Goals: Good ?Progress towards PT goals: Progressing toward goals ? ?  ?Frequency ? ? ? BID ? ? ? ?  ?PT Plan Current plan remains appropriate  ? ? ?Co-evaluation PT/OT/SLP Co-Evaluation/Treatment: Yes ?Reason for Co-Treatment: For patient/therapist safety;To address functional/ADL transfers ?PT goals addressed during session: Mobility/safety with mobility;Balance;Proper use of DME ?OT goals addressed during session: ADL's and self-care;Proper use of Adaptive equipment and DME ?  ? ?  ?AM-PAC PT "6 Clicks" Mobility   ?Outcome Measure ? Help needed turning from your back to your side while in a flat bed without using bedrails?: A Lot ?Help needed moving from lying on your back to sitting on the side of a flat bed without using bedrails?: Total ?Help needed moving to and from a bed to a chair (including a wheelchair)?: A Lot ?Help needed standing up from a chair using your arms (e.g., wheelchair or bedside chair)?: A Lot ?Help needed to walk in hospital room?: Total ?Help needed climbing 3-5 steps with a railing? : Total ?6 Click Score: 9 ? ?  ?End of Session Equipment Utilized During Treatment: Gait belt ?Activity Tolerance: Patient limited by pain ?Patient left: in bed;with call bell/phone within  reach;with nursing/sitter in room ?Nurse Communication: Mobility status;Patient requests pain meds ?PT Visit Diagnosis: Unsteadiness on feet (R26.81);Difficulty in walking, not elsewhere classified (R26.2);Muscle weakness (generalized) (M62.81);Pain ?Pain - Right/Left: Left ?Pain - part of body: Hip ?  ? ? ?Time: 3532-9924 ?PT Time Calculation (min) (ACUTE ONLY): 13 min ? ?Charges:  $Therapeutic Activity: 8-22 mins          ?          ? ?Aleda Grana, PT, DPT ?01/08/22, 2:01 PM ? ? ? ?Sandi Mariscal ?01/08/2022, 1:59 PM ? ?

## 2022-01-08 NOTE — Progress Notes (Signed)
?  Progress Note ? ? ?Patient: Cassie Solis [id number] DOB: [birth year] DOA: 01/06/2022     1 ?DOS: the patient was seen and examined on 01/08/2022 ?  ? ?Assessment and Plan: ?* Closed hip fracture requiring operative repair, left, sequela ?Patient had a mechanical fall and found to have a closed left intertrochanteric fracture of the left femur with slight lateral angulation.  Dr. Rudene Christians performed a intramedullary nail intertrochanteric procedure yesterday.  Pain control.  Physical therapy evaluation. ? ?Iron deficiency anemia ?Patient's hemoglobin dropped from 9.3 down to 7.6 with IV fluid hydration.  Patient given 1 unit of packed red blood cells yesterday.  Hemoglobin this morning down at 6.9.  Patient denies any bleeding.  We will give another unit of packed red blood cells today.  Ferritin low consistent with iron deficiency anemia. ? ?Lactic acidosis ?Urinalysis and chest x-ray negative.  Does not seem to be related to infection.  ? ?Stage 3b chronic kidney disease (CKD) ([place]) ?Creatinine 1.26 on presentation with IV fluids came down to 1.16.  Discontinue IV fluids with low hemoglobin today. ? ?Sinus bradycardia ?Not on any rate controlling medications. ? ?Hypotension ?Initial hypotension.  Transfusing a unit of packed red blood cells today. ? ? ? ? ? ?  ? ?Subjective: Patient seen this morning and was having some pain in her hip.  Had hip repair surgery yesterday by Dr. Rudene Christians.  Hemoglobin this morning down at 6.9.  Patient denies any bleeding. ? ?Physical Exam: ?Vitals:  ? 01/07/22 1930 01/07/22 2013 01/08/22 0309 01/08/22 0900  ?BP: 135/68 128/69 132/60 122/62  ?Pulse: 83 80 (!) 50 83  ?Resp: 12 16 16 15   ?Temp:  97.8 ?F (36.6 ?C) 98.4 ?F (36.9 ?C) 98.3 ?F (36.8 ?C)  ?TempSrc:      ?SpO2: 96% 97% 95% 95%  ?Weight:      ?Height:      ? ?Physical Exam ?HENT:  ?   Head: Normocephalic.  ?   Mouth/Throat:  ?   Pharynx: No oropharyngeal exudate.  ?Eyes:  ?   General: Lids are normal.  ?   Conjunctiva/sclera:  Conjunctivae normal.  ?Cardiovascular:  ?   Rate and Rhythm: Normal rate and regular rhythm.  ?   Heart sounds: Normal heart sounds, S1 normal and S2 normal.  ?Pulmonary:  ?   Breath sounds: Normal breath sounds. No decreased breath sounds, wheezing, rhonchi or rales.  ?Abdominal:  ?   Palpations: Abdomen is soft.  ?   Tenderness: There is no abdominal tenderness.  ?Musculoskeletal:  ?   Right lower leg: No swelling.  ?   Left lower leg: No swelling.  ?Skin: ?   General: Skin is warm.  ?   Comments: Bruising on left forehead, bruises seen on arms.  ?Neurological:  ?   Mental Status: She is alert and oriented to person, place, and time.  ?   Comments: Able to flex and extend bilateral ankles.  ?  ?Data Reviewed: ?Hemoglobin this morning 6.9, platelet count 117, creatinine 1.16 ? ?Family Communication: Updated son on the phone ? ?Disposition: ?Status is: Inpatient ?Remains inpatient appropriate because: Postoperative day 1 for left hip fracture ? ?Planned Discharge Destination: Rehab ? ?Author: ?Loletha Grayer, MD ?01/08/2022 12:34 PM ? ?For on call review [email or web address].  ?

## 2022-01-08 NOTE — Evaluation (Signed)
Physical Therapy Evaluation ?Patient Details ?Name: Cassie Solis ?MRN: [id number] ?DOB: 12-14-1928 ?Today's Date: 01/08/2022 ? ?History of Present Illness ? Pt is a 88 y/o F admitted on 01/06/22 after presenting with c/o fal & L hip pain. X ray revealed xray comminuted intertrochanteric fracture of the left femur with slight lateral angulation. Pt underwent sx for L IM nail placement by Dr. Rosita Kea on 01/07/22. PMH: vertigo, osteoporosis, arthritis  ?Clinical Impression ? MD cleared pt for PT session in setting of low Hgb.  ?Pt seen for PT evaluation with co-tx with OT for pt safety. Prior to admission pt was living alone in 1 level home with ramped entrance & ambulatory without AD. On this date, pt requires max +1-2 assist for bed mobility but can complete squat pivot transfers with mod assist but requires max assist +2 for stand pivot transfers with RW. Pt is limited by L hip pain with all activity, even trying to off load weight on L hip in sitting. During stand pivot BSC>bed pt c/o she feels as if she is "going to pass out" 2/2 pain so assisted back to supine quickly with +2 assist. Despite pain, pt is motivated to participate but has good understanding of STR recommendation upon d/c. Will continue to follow pt acutely to address balance, strengthening & ROM, and gait with LRAD. ? ?BP in RUE sitting on BSC 98/54 mmHg MAP 68 ?BP in LUE supine after toileting 97/56 mmHg MAP 70 ?BP in LUE reclined in recliner at end of session 136/62 mmHg MAP 79 ?   ?   ? ?Recommendations for follow up therapy are one component of a multi-disciplinary discharge planning process, led by the attending physician.  Recommendations may be updated based on patient status, additional functional criteria and insurance authorization. ? ?Follow Up Recommendations Skilled nursing-short term rehab (<3 hours/day) ? ?  ?Assistance Recommended at Discharge Frequent or constant Supervision/Assistance  ?Patient can return home with the following ? A lot  of help with walking and/or transfers;A lot of help with bathing/dressing/bathroom;Assistance with cooking/housework;Direct supervision/assist for medications management;Help with stairs or ramp for entrance ? ?  ?Equipment Recommendations Rolling walker (2 wheels);BSC/3in1  ?Recommendations for Other Services ?    ?  ?Functional Status Assessment Patient has had a recent decline in their functional status and demonstrates the ability to make significant improvements in function in a reasonable and predictable amount of time.  ? ?  ?Precautions / Restrictions Precautions ?Precautions: Fall ?Restrictions ?Weight Bearing Restrictions: Yes ?LLE Weight Bearing: Weight bearing as tolerated  ? ?  ? ?Mobility ? Bed Mobility ?Overal bed mobility: Needs Assistance ?Bed Mobility: Supine to Sit, Sit to Supine ?  ?  ?Supine to sit: Max assist, HOB elevated (use of bed rails) ?Sit to supine: Max assist, +2 for physical assistance, HOB elevated ?  ?  ?  ? ?Transfers ?Overall transfer level: Needs assistance ?Equipment used: Rolling walker (2 wheels) ?Transfers: Sit to/from Stand, Bed to chair/wheelchair/BSC ?Sit to Stand: Max assist, +2 physical assistance ?Stand pivot transfers: Max assist, +2 physical assistance ?  ?Squat pivot transfers: Mod assist, +2 physical assistance ?  ?  ?  ?  ? ?Ambulation/Gait ?  ?  ?  ?  ?  ?  ?  ?  ? ?Stairs ?  ?  ?  ?  ?  ? ?Wheelchair Mobility ?  ? ?Modified Rankin (Stroke Patients Only) ?  ? ?  ? ?Balance Overall balance assessment: Needs assistance ?Sitting-balance support: Feet supported, Bilateral  upper extremity supported ?Sitting balance-Leahy Scale: Poor ?Sitting balance - Comments: requires min assist for static sitting EOB with pt attempting to offload L hip ?  ?Standing balance support: Bilateral upper extremity supported, During functional activity ?Standing balance-Leahy Scale: Poor ?  ?  ?  ?  ?  ?  ?  ?  ?  ?  ?  ?  ?   ? ? ? ?Pertinent Vitals/Pain Pain Assessment ?Pain Assessment:  Faces ?Faces Pain Scale: Hurts whole lot ?Pain Location: LLE ?Pain Descriptors / Indicators: Discomfort, Grimacing ?Pain Intervention(s): Monitored during session, Repositioned  ? ? ?Home Living Family/patient expects to be discharged to:: Private residence ?Living Arrangements: Alone ?Available Help at Discharge: Family;Available PRN/intermittently ?Type of Home: House ?Home Access: Ramped entrance ?  ?  ?  ?Home Layout: One level ?Home Equipment: Shower seat ?   ?  ?Prior Function Prior Level of Function : Independent/Modified Independent ?  ?  ?  ?  ?  ?  ?Mobility Comments: Pt reports independence without AD, doesn't drive (children assist with this) but she still goes to grocery store with family ?  ?  ? ? ?Hand Dominance  ?   ? ?  ?Extremity/Trunk Assessment  ? Upper Extremity Assessment ?Upper Extremity Assessment: Overall WFL for tasks assessed ?  ? ?Lower Extremity Assessment ?Lower Extremity Assessment: Generalized weakness (did not formally test LLE, but difficulty weight bearing through LLE) ?  ? ?   ?Communication  ? Communication: No difficulties  ?Cognition Arousal/Alertness: Awake/alert ?[facility] tasks assessed/performed ?Overall Cognitive Status: Within Functional Limits for tasks assessed ?  ?  ?  ?  ?  ?  ?  ?  ?  ?  ?  ?  ?  ?  ?  ?  ?General Comments: sweet lady ?  ?  ? ?  ?General Comments General comments (skin integrity, edema, etc.): Pt with continent void on BSC. ? ?  ?Exercises General Exercises - Lower Extremity ?Heel Slides: AAROM, Left, 5 reps  ? ?Assessment/Plan  ?  ?PT Assessment Patient needs continued PT services  ?PT Problem List Decreased safety awareness;Decreased mobility;Decreased strength;Decreased range of motion;Decreased knowledge of precautions;Decreased activity tolerance;Decreased balance;Cardiopulmonary status limiting activity;Decreased knowledge of use of DME;Pain ? ?   ?  ?PT Treatment Interventions DME instruction;Therapeutic  activities;Modalities;Gait training;Therapeutic exercise;Patient/family education;Balance training;Functional mobility training;Neuromuscular re-education;Manual techniques   ? ?PT Goals (Current goals can be found in the Care Plan section)  ?Acute Rehab PT Goals ?Patient Stated Goal: decreased pain ?PT Goal Formulation: With patient ?Time For Goal Achievement: 01/22/22 ?Potential to Achieve Goals: Good ? ?  ?Frequency BID ?  ? ? ?Co-evaluation PT/OT/SLP Co-Evaluation/Treatment: Yes ?Reason for Co-Treatment: For patient/therapist safety;To address functional/ADL transfers ?PT goals addressed during session: Mobility/safety with mobility;Balance;Proper use of DME ?  ?  ? ? ?  ?AM-PAC PT "6 Clicks" Mobility  ?Outcome Measure Help needed turning from your back to your side while in a flat bed without using bedrails?: A Lot ?Help needed moving from lying on your back to sitting on the side of a flat bed without using bedrails?: Total ?Help needed moving to and from a bed to a chair (including a wheelchair)?: Total ?Help needed standing up from a chair using your arms (e.g., wheelchair or bedside chair)?: Total ?Help needed to walk in hospital room?: Total ?Help needed climbing 3-5 steps with a railing? : Total ?6 Click Score: 7 ? ?  ?End of Session Equipment Utilized During Treatment:  Gait belt ?Activity Tolerance: Treatment limited secondary to medical complications (Comment);Patient limited by pain ?Patient left: in chair;with call bell/phone within reach;with family/visitor present;with chair alarm set ?Nurse Communication: Mobility status ?PT Visit Diagnosis: Unsteadiness on feet (R26.81);Difficulty in walking, not elsewhere classified (R26.2);Muscle weakness (generalized) (M62.81);Pain ?Pain - Right/Left: Left ?Pain - part of body: Hip ?  ? ?Time: 4825-0037 ?PT Time Calculation (min) (ACUTE ONLY): 23 min ? ? ?Charges:   PT Evaluation ?$PT Eval Moderate Complexity: 1 Mod ?  ?  ?   ? ? ?Aleda Grana, PT,  DPT ?01/08/22, 10:37 AM ? ? ?Sandi Mariscal ?01/08/2022, 10:34 AM ? ?

## 2022-01-08 NOTE — Plan of Care (Signed)
  Problem: Clinical Measurements: Goal: Will remain free from infection Outcome: Progressing   Problem: Activity: Goal: Risk for activity intolerance will decrease Outcome: Progressing   Problem: Nutrition: Goal: Adequate nutrition will be maintained Outcome: Progressing   Problem: Pain Managment: Goal: General experience of comfort will improve Outcome: Progressing   

## 2022-01-08 NOTE — [place] FL2 (Signed)
?[place] MEDICAID FL2 LEVEL OF CARE SCREENING TOOL  ?  ? ?IDENTIFICATION  ?Patient Name: ?Cassie Solis Birthdate: Feb 09, 1929 Sex: female Admission Date (Current Location): ?01/06/2022  ?[place] and [place] Number: ? [place] ?  Facility and Address:  ?[facility], [address] [place], [place] [address] ?     Provider Number: ?[id number]  ?Attending Physician Name and Address:  ?Loletha Grayer, MD ? Relative Name and Phone Number:  ?  ?   ?Current Level of Care: ?Hospital Recommended Level of Care: ?[place] Prior Approval Number: ?  ? ?Date Approved/Denied: ?  PASRR Number: ?[id number] ? ?Discharge Plan: ?  ?  ? ?Current Diagnoses: ?Patient Active Problem List  ? Diagnosis Date Noted  ? Femur fracture, left ([place]) 01/07/2022  ? Iron deficiency anemia 01/07/2022  ? Stage 3b chronic kidney disease (CKD) ([place]) 01/07/2022  ? Lactic acidosis 01/07/2022  ? Closed hip fracture requiring operative repair, left, sequela 01/06/2022  ? Hypotension 01/06/2022  ? Sinus bradycardia 01/06/2022  ? ? ?Orientation RESPIRATION BLADDER Height & Weight   ?  ?Self, Time, Situation, Place ? Normal Continent Weight: 117 lb 4.6 oz (53.2 kg) ?Height:  5\' 8"  (172.7 cm)  ?BEHAVIORAL SYMPTOMS/MOOD NEUROLOGICAL BOWEL NUTRITION STATUS  ?      Diet (regular)  ?AMBULATORY STATUS COMMUNICATION OF NEEDS Skin   ?Extensive Assist Verbally Surgical wounds (L hip) ?  ?  ?  ?    ?     ?     ? ? ?Personal Care Assistance Level of Assistance  ?Bathing, Feeding, Dressing Bathing Assistance: Maximum assistance ?Feeding assistance: Limited assistance ?Dressing Assistance: Maximum assistance ?   ? ?Functional Limitations Info  ?    ?  ?   ? ? ?SPECIAL CARE FACTORS FREQUENCY  ?PT (By licensed PT), OT (By licensed OT)   ?  ?PT Frequency: 5 times per week ?OT Frequency: 5 times per week ?  ?  ?  ?   ? ? ?Contractures    ? ? ?Additional Factors Info  ?Code Status, Allergies Code Status Info: full ?Allergies Info:  azithromycin ?  ?  ?  ?   ? ?Current Medications (01/08/2022):  This is the current hospital active medication list ?Current Facility-Administered Medications  ?Medication Dose Route Frequency Provider Last Rate Last Admin  ? 0.9 %  sodium chloride infusion (Manually program via Guardrails IV Fluids)   Intravenous Once Hessie Knows, MD      ? 0.9 %  sodium chloride infusion (Manually program via Guardrails IV Fluids)   Intravenous Once Loletha Grayer, MD      ? acetaminophen (TYLENOL) tablet 325-650 mg  325-650 mg Oral Q6H PRN Hessie Knows, MD      ? acetaminophen (TYLENOL) tablet 650 mg  650 mg Oral Once Hessie Knows, MD      ? alum & mag hydroxide-simeth (MAALOX/MYLANTA) 200-200-20 MG/5ML suspension 30 mL  30 mL Oral Q4H PRN Hessie Knows, MD      ? bisacodyl (DULCOLAX) suppository 10 mg  10 mg Rectal Daily PRN Hessie Knows, MD      ? Chlorhexidine Gluconate Cloth 2 % PADS 6 each  6 each Topical Daily Amaonwu, Ogechukwu L, RN      ? docusate sodium (COLACE) capsule 100 mg  100 mg Oral BID Hessie Knows, MD   100 mg at 01/08/22 1233  ? enoxaparin (LOVENOX) injection 30 mg  30 mg Subcutaneous Q24H Eleonore Chiquito S, RPH   30 mg at  01/08/22 1232  ? feeding supplement (ENSURE ENLIVE / ENSURE PLUS) liquid 237 mL  237 mL Oral BID BM Hessie Knows, MD   237 mL at 01/08/22 1235  ? HYDROcodone-acetaminophen (NORCO) 7.5-325 MG per tablet 1-2 tablet  1-2 tablet Oral Q4H PRN Hessie Knows, MD   2 tablet at 01/08/22 0423  ? HYDROcodone-acetaminophen (NORCO/VICODIN) 5-325 MG per tablet 1-2 tablet  1-2 tablet Oral Q4H PRN Hessie Knows, MD      ? HYDROmorphone (DILAUDID) injection 1 mg  1 mg Intravenous Q6H PRN Hessie Knows, MD   1 mg at 01/07/22 2120  ? magnesium hydroxide (MILK OF MAGNESIA) suspension 30 mL  30 mL Oral Daily PRN Hessie Knows, MD   30 mL at 01/08/22 0423  ? menthol-cetylpyridinium (CEPACOL) lozenge 3 mg  1 lozenge Oral PRN Hessie Knows, MD      ? Or  ? phenol (CHLORASEPTIC) mouth spray 1 spray  1 spray  Mouth/Throat PRN Hessie Knows, MD      ? methocarbamol (ROBAXIN) tablet 500 mg  500 mg Oral Q6H PRN Hessie Knows, MD   500 mg at 01/08/22 1232  ? Or  ? methocarbamol (ROBAXIN) 500 mg in dextrose 5 % 50 mL IVPB  500 mg Intravenous Q6H PRN Hessie Knows, MD      ? metoCLOPramide (REGLAN) tablet 5-10 mg  5-10 mg Oral Q8H PRN Hessie Knows, MD      ? Or  ? metoCLOPramide (REGLAN) injection 5-10 mg  5-10 mg Intravenous Q8H PRN Hessie Knows, MD      ? morphine (PF) 2 MG/ML injection 0.5-1 mg  0.5-1 mg Intravenous Q2H PRN Hessie Knows, MD      ? multivitamin with minerals tablet 1 tablet  1 tablet Oral Daily Hessie Knows, MD   1 tablet at 01/08/22 1245  ? ondansetron (ZOFRAN) tablet 4 mg  4 mg Oral Q6H PRN Hessie Knows, MD      ? Or  ? ondansetron [facility]) injection 4 mg  4 mg Intravenous Q6H PRN Hessie Knows, MD      ? pantoprazole (PROTONIX) injection 40 mg  40 mg Intravenous Q12H Hessie Knows, MD   40 mg at 01/08/22 1231  ? polyethylene glycol (MIRALAX / GLYCOLAX) packet 17 g  17 g Oral Daily PRN Hessie Knows, MD      ? zolpidem (AMBIEN) tablet 5 mg  5 mg Oral QHS PRN Hessie Knows, MD      ? ? ? ?Discharge Medications: ?Please see discharge summary for a list of discharge medications. ? ?Relevant Imaging Results: ? ?Relevant Lab Results: ? ? ?Additional Information ?SS #: [id number] ? ?Michel Hendon E Jandel Patriarca, LCSW ? ? ? ? ?

## 2022-01-09 DIAGNOSIS — I951 Orthostatic hypotension: Secondary | ICD-10-CM | POA: Diagnosis not present

## 2022-01-09 DIAGNOSIS — S72002S Fracture of unspecified part of neck of left femur, sequela: Secondary | ICD-10-CM | POA: Diagnosis not present

## 2022-01-09 DIAGNOSIS — D509 Iron deficiency anemia, unspecified: Secondary | ICD-10-CM | POA: Diagnosis not present

## 2022-01-09 DIAGNOSIS — E872 Acidosis, unspecified: Secondary | ICD-10-CM | POA: Diagnosis not present

## 2022-01-09 LAB — CBC
HCT: 26.5 % — ABNORMAL LOW (ref 36.0–46.0)
Hemoglobin: 8.9 g/dL — ABNORMAL LOW (ref 12.0–15.0)
MCH: 32.2 pg (ref 26.0–34.0)
MCHC: 33.6 g/dL (ref 30.0–36.0)
MCV: 96 fL (ref 80.0–100.0)
Platelets: 116 10*3/uL — ABNORMAL LOW (ref 150–400)
RBC: 2.76 MIL/uL — ABNORMAL LOW (ref 3.87–5.11)
RDW: 17.7 % — ABNORMAL HIGH (ref 11.5–15.5)
WBC: 10.2 10*3/uL (ref 4.0–10.5)
nRBC: 0 % (ref 0.0–0.2)

## 2022-01-09 LAB — TYPE AND SCREEN
ABO/RH(D): A POS
Antibody Screen: NEGATIVE
Unit division: 0
Unit division: 0

## 2022-01-09 LAB — BPAM RBC
Blood Product Expiration Date: 202305232359
Blood Product Expiration Date: 202306082359
ISSUE DATE / TIME: 202305111023
ISSUE DATE / TIME: 202305121409
Unit Type and Rh: 6200
Unit Type and Rh: 6200

## 2022-01-09 LAB — BASIC METABOLIC PANEL
Anion gap: 6 (ref 5–15)
BUN: 29 mg/dL — ABNORMAL HIGH (ref 8–23)
CO2: 30 mmol/L (ref 22–32)
Calcium: 8.2 mg/dL — ABNORMAL LOW (ref 8.9–10.3)
Chloride: 101 mmol/L (ref 98–111)
Creatinine, Ser: 0.99 mg/dL (ref 0.44–1.00)
GFR, Estimated: 54 mL/min — ABNORMAL LOW (ref >60)
Glucose, Bld: 111 mg/dL — ABNORMAL HIGH (ref 70–99)
Potassium: 3.4 mmol/L — ABNORMAL LOW (ref 3.5–5.1)
Sodium: 137 mmol/L (ref 135–145)

## 2022-01-09 MED ORDER — POTASSIUM CHLORIDE 20 MEQ PO PACK
20.0000 meq | PACK | Freq: Once | ORAL | Status: AC
  Administered 2022-01-09: 20 meq via ORAL
  Filled 2022-01-09: qty 1

## 2022-01-09 MED ORDER — SODIUM CHLORIDE 0.9 % IV SOLN
400.0000 mg | Freq: Once | INTRAVENOUS | Status: AC
  Administered 2022-01-09: 400 mg via INTRAVENOUS
  Filled 2022-01-09: qty 20

## 2022-01-09 NOTE — Progress Notes (Signed)
Physical Therapy Treatment ?Patient Details ?Name: Cassie Solis ?MRN: [id number] ?DOB: 1929-07-10 ?Today's Date: 01/09/2022 ? ? ?History of Present Illness Pt is a 88 y/o F admitted on 01/06/22 after presenting with c/o fal & L hip pain. X ray revealed xray comminuted intertrochanteric fracture of the left femur with slight lateral angulation. Pt underwent sx for L IM nail placement by Dr. Rosita Kea on 01/07/22. PMH: vertigo, osteoporosis, arthritis ? ?  ?PT Comments  ? ? Pt seen for PT tx with pt agreeable. Pt continues to be limited by pain & low BP with mobility. Pt completes STS x 2 with mod assist with cuing for safe hand placement when using RW & completes stand pivot with RW & mod assist. 2nd person present for session for safety following low BP episode during AM session. Pt performs LLE exercises with AAROM with ongoing weakness noted. Will continue to follow pt acutely to address strengthening, balance, and gait with LRAD. ? ?BP checked in LUE: ?Sitting in recliner: 97/57 mmHg (MAP 69), HR 86 bpm ?Standing at 0: 87/48 mmHg (MAP 60), HR 92 bpm ?Supine: 119/62 mmHg (MAP 76), HR 84 bpm ? ?Pt does endorse feeling lightheaded in standing but better upon supine. ? ?   ?Recommendations for follow up therapy are one component of a multi-disciplinary discharge planning process, led by the attending physician.  Recommendations may be updated based on patient status, additional functional criteria and insurance authorization. ? ?Follow Up Recommendations ? Skilled nursing-short term rehab (<3 hours/day) ?  ?  ?Assistance Recommended at Discharge Frequent or constant Supervision/Assistance  ?Patient can return home with the following A lot of help with walking and/or transfers;A lot of help with bathing/dressing/bathroom;Assistance with cooking/housework;Direct supervision/assist for medications management;Help with stairs or ramp for entrance ?  ?Equipment Recommendations ? Rolling walker (2 wheels);BSC/3in1  ?   ?Recommendations for Other Services   ? ? ?  ?Precautions / Restrictions Precautions ?Precautions: Fall ?Restrictions ?Weight Bearing Restrictions: Yes ?LLE Weight Bearing: Weight bearing as tolerated  ?  ? ?Mobility ? Bed Mobility ?Overal bed mobility: Needs Assistance ?Bed Mobility: Sit to Supine ?  ?  ?Supine to sit: Mod assist, HOB elevated ?Sit to supine: Mod assist, HOB elevated (assistance to elevate LLE onto bed & for lowering trunk/positioning in center of bed) ?  ?General bed mobility comments: cuing to use bed rails, assistance to upright trunk ?  ? ?Transfers ?Overall transfer level: Needs assistance ?Equipment used: Rolling walker (2 wheels) ?Transfers: Sit to/from Stand, Bed to chair/wheelchair/BSC ?Sit to Stand: Mod assist ?Stand pivot transfers: Mod assist ?Step pivot transfers: Max assist ?  ?  ?  ?  ?  ? ?Ambulation/Gait ?  ?  ?  ?  ?  ?  ?  ?  ? ? ?Stairs ?  ?  ?  ?  ?  ? ? ?Wheelchair Mobility ?  ? ?Modified Rankin (Stroke Patients Only) ?  ? ? ?  ?Balance Overall balance assessment: Needs assistance ?Sitting-balance support: Feet supported, Bilateral upper extremity supported ?Sitting balance-Leahy Scale: Fair ?Sitting balance - Comments: min fade to close supervision for static sitting EOB ?  ?Standing balance support: Bilateral upper extremity supported, During functional activity ?Standing balance-Leahy Scale: Poor ?Standing balance comment: posterior LOB in standing with mod/max assist to correct with BUE support on RW ?  ?  ?  ?  ?  ?  ?  ?  ?  ?  ?  ?  ? ?  ?Cognition Arousal/Alertness: Awake/alert ?Behavior  During Therapy: [facility] for tasks assessed/performed ?Overall Cognitive Status: Within Functional Limits for tasks assessed ?  ?  ?  ?  ?  ?  ?  ?  ?  ?  ?  ?  ?  ?  ?  ?  ?General Comments: possible recall deficits, slower processing ?  ?  ? ?  ?Exercises General Exercises - Lower Extremity ?Short Arc Quad: AAROM, Strengthening, Left, 10 reps, Supine ?Long Arc Quad: AROM, Strengthening,  Left, Seated (12 reps) ?Heel Slides: AAROM, Left, 10 reps, Supine, Strengthening ?Hip ABduction/ADduction: AAROM, Strengthening, Left, 10 reps, Supine (hip abduction slides) ?Straight Leg Raises: AAROM, Strengthening, Left, 10 reps, Supine ? ?  ?General Comments   ?  ?  ? ?Pertinent Vitals/Pain Pain Assessment ?Pain Assessment: 0-10 ?Pain Score: 8  ?Faces Pain Scale: Hurts even more ?Pain Location: L hip ?Pain Descriptors / Indicators: Grimacing, Discomfort ?Pain Intervention(s): Patient requesting pain meds-RN notified, Monitored during session  ? ? ?Home Living   ?  ?  ?  ?  ?  ?  ?  ?  ?  ?   ?  ?Prior Function    ?  ?  ?   ? ?PT Goals (current goals can now be found in the care plan section) Acute Rehab PT Goals ?Patient Stated Goal: decreased pain ?PT Goal Formulation: With patient ?Time For Goal Achievement: 01/22/22 ?Potential to Achieve Goals: Good ?Progress towards PT goals: Progressing toward goals ? ?  ?Frequency ? ? ? BID ? ? ? ?  ?PT Plan Current plan remains appropriate  ? ? ?Co-evaluation   ?  ?  ?  ?  ? ?  ?AM-PAC PT "6 Clicks" Mobility   ?Outcome Measure ? Help needed turning from your back to your side while in a flat bed without using bedrails?: A Little ?Help needed moving from lying on your back to sitting on the side of a flat bed without using bedrails?: A Lot ?Help needed moving to and from a bed to a chair (including a wheelchair)?: A Lot ?Help needed standing up from a chair using your arms (e.g., wheelchair or bedside chair)?: A Lot ?Help needed to walk in hospital room?: Total ?Help needed climbing 3-5 steps with a railing? : Total ?6 Click Score: 11 ? ?  ?End of Session Equipment Utilized During Treatment: Gait belt ?Activity Tolerance: Patient limited by pain (limited by low BP) ?Patient left: in bed;with call bell/phone within reach;with bed alarm set;with SCD's reapplied ?Nurse Communication: Patient requests pain meds (BP & c/o pain) ?PT Visit Diagnosis: Unsteadiness on feet  (R26.81);Difficulty in walking, not elsewhere classified (R26.2);Muscle weakness (generalized) (M62.81);Pain ?Pain - Right/Left: Left ?Pain - part of body: Hip ?  ? ? ?Time: 2956-2130 ?PT Time Calculation (min) (ACUTE ONLY): 16 min ? ?Charges:  $Therapeutic Activity: 8-22 mins          ?          ? ?Aleda Grana, PT, DPT ?01/09/22, 2:51 PM ? ? ? ?Sandi Mariscal ?01/09/2022, 2:48 PM ? ?

## 2022-01-09 NOTE — TOC Progression Note (Addendum)
Transition of Care (TOC) - Progression Note  ? ? ?Patient Details  ?Name: [name] ?MRN: [id number] ?Date of Birth: Mar 03, 1929 ? ?Transition of Care (TOC) CM/SW Contact  ?Ingram Onnen E Lorretta Kerce, LCSW ?Phone Number: ?01/09/2022, 11:13 AM ? ?Clinical Narrative:   CSW spoke with patient and son. Presented bed offers. They chose [facility] in [place]. CSW notified [facility] at [facility]. Plan for [facility] Monday pending insurance auth.  ?CSW called [employer] and spoke with [facility]. Started auth for [facility] and [facility] transfer. ? ?2:40- Call from [facility] with [employer]. Patient is approved for [facility] auth # [id number]. Patient is also approved for [facility] transport [place] # [id number]. Good for 5 business days.  ? ? ?Expected Discharge Plan: [place] ?Barriers to Discharge: Continued Medical Work up ? ?Expected Discharge Plan and Services ?Expected Discharge Plan: [place] ?  ?Discharge Planning Services: CM Consult ?  ?Living arrangements for the past 2 months: [place] ?                ?  ?  ?  ?  ?  ?  ?  ?  ?  ?  ? ? ?Social Determinants of Health (SDOH) Interventions ?  ? ?Readmission Risk Interventions ? ?  01/08/2022  ?  1:46 PM  ?Readmission Risk Prevention Plan  ?Transportation Screening Complete  ?PCP or Specialist Appt within 5-7 Days Complete  ?Home Care Screening Complete  ?Medication Review (RN CM) Complete  ? ? ?

## 2022-01-09 NOTE — Progress Notes (Signed)
?Progress Note ? ? ?Patient: Cassie Solis [id number] DOB: 06/26/1929 DOA: 01/06/2022     2 ?DOS: the patient was seen and examined on 01/09/2022 ?  ? ?Assessment and Plan: ?* Closed hip fracture requiring operative repair, left, sequela ?Patient had a mechanical fall and found to have a closed left intertrochanteric fracture of the left femur with slight lateral angulation.  Dr. Rudene Christians performed a intramedullary nail intertrochanteric procedure on 01/07/2022.  Pain control.  Physical therapy evaluation appreciated. ? ?Iron deficiency anemia ?Outpatient hemoglobin in March was 11.3.  Came in with a hemoglobin of 9.6 and went down to 7.6 with IV fluids.  She was given a unit of packed red blood cells and hemoglobin went up only to 7.8 but then the next morning was down to 6.9.  Patient was given another unit of packed red blood cells and hemoglobin came up to 8.9.  We will give some IV iron today.  Case discussed with gastroenterology.  We will keep n.p.o. after midnight just in case endoscopy needed.  Protonix 40 mg IV every 12. ? ?Orthostatic hypotension ?Patient with working with physical therapy got dizzy and needed to be placed in the chair.  Follow-up blood pressures are better.  Patient already received 2 units of packed red blood cells.  Continue to monitor closely. ? ? ?Lactic acidosis ?Urinalysis and chest x-ray negative.  Does not seem to be related to infection.  ? ?Stage 3b chronic kidney disease (CKD) ([place]) ?Creatinine 1.26 on presentation with IV fluids came down to 0.99.  ? ?Sinus bradycardia ?Not on any rate controlling medications. ? ? ? ? ?  ? ?Subjective: Patient does have a little discomfort in the left hip.  Physical therapist mentioned that that she did get orthostatic with working with them today and needed to be placed in the chair.  Patient denies any blood loss.  Hemoglobin as outpatient was 11.3 in March. ? ?Physical Exam: ?Vitals:  ? 01/08/22 1520 01/08/22 1933 01/09/22 0444 01/09/22 0841   ?BP: (!) 105/45 125/71 (!) 153/87 131/83  ?Pulse: 89 69 (!) 106 84  ?Resp: 14 16 14 16   ?Temp: 98.3 ?F (36.8 ?C) 98.8 ?F (37.1 ?C) 98.3 ?F (36.8 ?C) 97.8 ?F (36.6 ?C)  ?TempSrc:    Oral  ?SpO2: 97% 90% 94% 96%  ?Weight:      ?Height:      ? ?Physical Exam ?HENT:  ?   Head: Normocephalic.  ?   Mouth/Throat:  ?   Pharynx: No oropharyngeal exudate.  ?Eyes:  ?   General: Lids are normal.  ?   Conjunctiva/sclera: Conjunctivae normal.  ?Cardiovascular:  ?   Rate and Rhythm: Normal rate and regular rhythm.  ?   Heart sounds: Normal heart sounds, S1 normal and S2 normal.  ?Pulmonary:  ?   Breath sounds: Normal breath sounds. No decreased breath sounds, wheezing, rhonchi or rales.  ?Abdominal:  ?   Palpations: Abdomen is soft.  ?   Tenderness: There is no abdominal tenderness.  ?Musculoskeletal:  ?   Right lower leg: No swelling.  ?   Left lower leg: No swelling.  ?Skin: ?   General: Skin is warm.  ?   Comments: Bruising on left forehead, bruises seen on arms.  ?Neurological:  ?   Mental Status: She is alert and oriented to person, place, and time.  ?   Comments: Able to flex and extend bilateral ankles.  ?  ?Data Reviewed: ?Today's hemoglobin 8.9, platelet count 116, creatinine down  to 0.99, potassium 3.4 ? ? ?Family Communication: Updated patient's son on the phone ? ?Disposition: ?Status is: Inpatient ?Remains inpatient appropriate because: Postoperative day 2 hip fracture, work-up for iron deficiency anemia, became orthostatic with working with PT. ? ?Planned Discharge Destination: Home ? ? ?Author: ?Loletha Grayer, MD ?01/09/2022 2:02 PM ? ?For on call review [email or web address].  ?

## 2022-01-09 NOTE — Progress Notes (Signed)
Subjective: ?2 Days Post-Op Procedure(s) (LRB): ?INTRAMEDULLARY (IM) NAIL INTERTROCHANTRIC (Left) ?Patient reports pain as mild this morning.  ?Patient is well, and has had no acute complaints or problems ?States that she feels better following her transfusion yesterday. ?Denies any CP, SOB, ABD pain. ?We will continue therapy today.  Current plan is for discharge to SNF when able. ? ?Objective: ?Vital signs in last 24 hours: ?Temp:  [97.9 ?F (36.6 ?C)-98.8 ?F (37.1 ?C)] 98.3 ?F (36.8 ?C) (05/13 0444) ?Pulse Rate:  [69-106] 106 (05/13 0444) ?Resp:  [14-17] 14 (05/13 0444) ?BP: (105-153)/(45-87) 153/87 (05/13 0444) ?SpO2:  [90 %-97 %] 94 % (05/13 0444) ? ?Intake/Output from previous day: ?05/12 0701 - 05/13 0700 ?In: [id number] [P.O.:120; Blood:572; IV Piggyback:50] ?Out: 950 [Urine:950] ?Intake/Output this shift: ?No intake/output data recorded. ? ?Recent Labs  ?  01/06/22 ?1736 01/07/22 ?[id number] 01/07/22 ?2030 01/08/22 ?[id number] 01/09/22 ?[id number]  ?HGB 9.3* 7.6* 7.8* 6.9* 8.9*  ? ?Recent Labs  ?  01/08/22 ?0616 01/09/22 ?0721  ?WBC 9.6 10.2  ?RBC 2.04* 2.76*  ?HCT 20.9* 26.5*  ?PLT 117* 116*  ? ?Recent Labs  ?  01/08/22 ?0616 01/09/22 ?0721  ?NA 138 137  ?K 4.5 3.4*  ?CL 104 101  ?CO2 30 30  ?BUN 33* 29*  ?CREATININE 1.16* 0.99  ?GLUCOSE 134* 111*  ?CALCIUM 8.0* 8.2*  ? ?Recent Labs  ?  01/06/22 ?1736  ?INR 1.1  ? ?EXAM ?General - Patient is Alert, Appropriate, and Oriented ?Extremity - Neurovascular intact ?Sensation intact distally ?Intact pulses distally ?Dorsiflexion/Plantar flexion intact ?No cellulitis present ?Compartment soft ?Dressing - dressing C/D/I and scant drainage ?Motor Function - intact, moving foot and toes well on exam.  ? ?Past Medical History:  ?Diagnosis Date  ? 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 Osteoporosis   ? Seasonal allergies   ? Vertigo   ? rare  ? ? ?Assessment/Plan:   ?2 Days Post-Op Procedure(s) (LRB): ?INTRAMEDULLARY (IM) NAIL INTERTROCHANTRIC (Left) ?Principal Problem: ?  Closed hip  fracture requiring operative repair, left, sequela ?Active Problems: ?  Hypotension ?  Sinus bradycardia ?  Femur fracture, left ([place]) ?  Iron deficiency anemia ?  Stage 3b chronic kidney disease (CKD) ([place]) ?  Lactic acidosis ? ?Estimated body mass index is 17.83 kg/m? as calculated from the following: ?  Height as of this encounter: 5\' 8"  (1.727 m). ?  Weight as of this encounter: 53.2 kg. ?Advance diet ?Up with therapy, WBAT ? ?Work on [employer], states that she is passing gas. ?HR 106 this morning, Hg up to 8.9 following transfusion. ?K+ 3.4, will supplement. ?Up with therapy today. ?Plan for discharge to SNF when able. ?CBC and BMP ordered for tomorrow morning. ? ?DVT Prophylaxis - Lovenox, TED hose, and SCDs ?Weight-Bearing as tolerated to left leg ? ? ?Raquel Varie Machamer, PA-C ?[place] ?01/09/2022, 8:25 AM ?  ?

## 2022-01-09 NOTE — Progress Notes (Signed)
Physical Therapy Treatment ?Patient Details ?Name: Cassie Solis ?MRN: [id number] ?DOB: [birth year] ?Today's Date: 01/09/2022 ? ? ?History of Present Illness Pt is a 88 y/o F admitted on 01/06/22 after presenting with c/o fal & L hip pain. X ray revealed xray comminuted intertrochanteric fracture of the left femur with slight lateral angulation. Pt underwent sx for L IM nail placement by Dr. Rosita Kea on 01/07/22. PMH: vertigo, osteoporosis, arthritis ? ?  ?PT Comments  ? ? Pt seen for PT tx with pt agreeable & premedicated for session, son present to observe. Pt engages in LLE strengthening exercises at bed level with AAROM. Pt is able to complete supine>sit with mod assist with cuing for use of bed rails. Pt is able to complete STS with mod assist with ongoing cuing for hand placement & use of RW and then initiates stand pivot bed>RW with pt able to initiate stepping but mid transfer pt became less responsive so pt assisted to recliner. Pt continuing to not respond but eyes open, gazing upwards to R. Nurse called to room & BP assessed, anticipate pt became orthostatic hypotensive. Pt more alert & engaging with PT & nurse once flat in recliner. At end of session pt sitting upright in recliner, reporting she feels much better. Encouraged pt to drink fluids throughout the day. PA notified of events of session as well. ? ?BP checked in LUE:  ?Sitting EOB 100/54 mmHg MAP 65 ?Reclined flat in chair 87/57 mmHg MAP 66 ?Semi reclined in chair 106/69 mmHg MAP 82 ?Semi reclined in chair at end of session 144/74 mmHg MAP 94 ?   ?Recommendations for follow up therapy are one component of a multi-disciplinary discharge planning process, led by the attending physician.  Recommendations may be updated based on patient status, additional functional criteria and insurance authorization. ? ?Follow Up Recommendations ? Skilled nursing-short term rehab (<3 hours/day) ?  ?  ?Assistance Recommended at Discharge Frequent or constant  Supervision/Assistance  ?Patient can return home with the following A lot of help with walking and/or transfers;A lot of help with bathing/dressing/bathroom;Assistance with cooking/housework;Direct supervision/assist for medications management;Help with stairs or ramp for entrance ?  ?Equipment Recommendations ? Rolling walker (2 wheels);BSC/3in1  ?  ?Recommendations for Other Services   ? ? ?  ?Precautions / Restrictions Precautions ?Precautions: Fall ?Restrictions ?Weight Bearing Restrictions: Yes ?LLE Weight Bearing: Weight bearing as tolerated  ?  ? ?Mobility ? Bed Mobility ?Overal bed mobility: Needs Assistance ?Bed Mobility: Supine to Sit ?  ?  ?Supine to sit: Mod assist, HOB elevated ?  ?  ?General bed mobility comments: cuing to use bed rails, assistance to upright trunk ?  ? ?Transfers ?Overall transfer level: Needs assistance ?Equipment used: Rolling walker (2 wheels) ?Transfers: Sit to/from Stand, Bed to chair/wheelchair/BSC ?Sit to Stand: Mod assist ?  ?Step pivot transfers: Max assist ?  ?  ?  ?  ?  ? ?Ambulation/Gait ?  ?  ?  ?  ?  ?  ?  ?  ? ? ?Stairs ?  ?  ?  ?  ?  ? ? ?Wheelchair Mobility ?  ? ?Modified Rankin (Stroke Patients Only) ?  ? ? ?  ?Balance Overall balance assessment: Needs assistance ?Sitting-balance support: Feet supported, Bilateral upper extremity supported ?Sitting balance-Leahy Scale: Fair ?Sitting balance - Comments: min fade to close supervision for static sitting EOB ?  ?Standing balance support: Bilateral upper extremity supported, During functional activity ?Standing balance-Leahy Scale: Poor ?Standing balance comment: posterior LOB in standing  with mod/max assist to correct with BUE support on RW ?  ?  ?  ?  ?  ?  ?  ?  ?  ?  ?  ?  ? ?  ?Cognition Arousal/Alertness: Awake/alert ?Behavior During Therapy: [facility] for tasks assessed/performed ?Overall Cognitive Status: Within Functional Limits for tasks assessed ?  ?  ?  ?  ?  ?  ?  ?  ?  ?  ?  ?  ?  ?  ?  ?  ?General Comments: good  awareness of deficits and current situation, slow processing with pain ?  ?  ? ?  ?Exercises General Exercises - Lower Extremity ?Short Arc Quad: AAROM, Strengthening, Left, 10 reps, Supine ?Long Arc Quad: AROM, Strengthening, Left, 10 reps, Supine ?Heel Slides: AAROM, Left, 10 reps, Supine, Strengthening ?Hip ABduction/ADduction: AAROM, Strengthening, Left, 10 reps, Supine (hip abduction slides) ?Straight Leg Raises: AAROM, Strengthening, Left, 10 reps, Supine ? ?  ?General Comments   ?  ?  ? ?Pertinent Vitals/Pain Pain Assessment ?Pain Assessment: Faces ?Faces Pain Scale: Hurts even more ?Pain Location: L hip ?Pain Descriptors / Indicators: Grimacing, Discomfort ?Pain Intervention(s): Premedicated before session, Monitored during session, Repositioned  ? ? ?Home Living   ?  ?  ?  ?  ?  ?  ?  ?  ?  ?   ?  ?Prior Function    ?  ?  ?   ? ?PT Goals (current goals can now be found in the care plan section) Acute Rehab PT Goals ?Patient Stated Goal: decreased pain ?PT Goal Formulation: With patient ?Time For Goal Achievement: 01/22/22 ?Potential to Achieve Goals: Good ?Progress towards PT goals: Progressing toward goals ? ?  ?Frequency ? ? ? BID ? ? ? ?  ?PT Plan Current plan remains appropriate  ? ? ?Co-evaluation   ?  ?  ?  ?  ? ?  ?AM-PAC PT "6 Clicks" Mobility   ?Outcome Measure ? Help needed turning from your back to your side while in a flat bed without using bedrails?: A Little ?Help needed moving from lying on your back to sitting on the side of a flat bed without using bedrails?: A Lot ?Help needed moving to and from a bed to a chair (including a wheelchair)?: Total ?Help needed standing up from a chair using your arms (e.g., wheelchair or bedside chair)?: A Lot ?Help needed to walk in hospital room?: Total ?Help needed climbing 3-5 steps with a railing? : Total ?6 Click Score: 10 ? ?  ?End of Session Equipment Utilized During Treatment: Gait belt ?Activity Tolerance: Patient limited by pain;Treatment limited  secondary to medical complications (Comment) ?Patient left: in chair;with chair alarm set;with call bell/phone within reach;with family/visitor present ?Nurse Communication: Mobility status (events during session) ?PT Visit Diagnosis: Unsteadiness on feet (R26.81);Difficulty in walking, not elsewhere classified (R26.2);Muscle weakness (generalized) (M62.81);Pain ?Pain - Right/Left: Left ?Pain - part of body: Hip ?  ? ? ?Time: [phone] ?PT Time Calculation (min) (ACUTE ONLY): 24 min ? ?Charges:  $Therapeutic Exercise: 8-22 mins ?$Therapeutic Activity: 8-22 mins          ?          ? ?Aleda Grana, PT, DPT ?01/09/22, 9:35 AM ? ? ? ?Sandi Mariscal ?01/09/2022, 9:30 AM ? ?

## 2022-01-10 ENCOUNTER — Encounter
Admission: EM | Disposition: A | Discharge status: Skilled Nursing Facility | Payer: Self-pay | Source: Home / Self Care | Attending: Internal Medicine

## 2022-01-10 ENCOUNTER — Encounter: Payer: Self-pay | Admitting: Anesthesiology

## 2022-01-10 ENCOUNTER — Inpatient Hospital Stay: Payer: PPO

## 2022-01-10 DIAGNOSIS — S72002S Fracture of unspecified part of neck of left femur, sequela: Secondary | ICD-10-CM | POA: Diagnosis not present

## 2022-01-10 DIAGNOSIS — D62 Acute posthemorrhagic anemia: Secondary | ICD-10-CM | POA: Diagnosis not present

## 2022-01-10 DIAGNOSIS — I951 Orthostatic hypotension: Secondary | ICD-10-CM | POA: Diagnosis not present

## 2022-01-10 DIAGNOSIS — E872 Acidosis, unspecified: Secondary | ICD-10-CM | POA: Diagnosis not present

## 2022-01-10 DIAGNOSIS — D509 Iron deficiency anemia, unspecified: Secondary | ICD-10-CM | POA: Diagnosis not present

## 2022-01-10 LAB — BASIC METABOLIC PANEL
Anion gap: 11 (ref 5–15)
BUN: 32 mg/dL — ABNORMAL HIGH (ref 8–23)
CO2: 29 mmol/L (ref 22–32)
Calcium: 8 mg/dL — ABNORMAL LOW (ref 8.9–10.3)
Chloride: 97 mmol/L — ABNORMAL LOW (ref 98–111)
Creatinine, Ser: 1.02 mg/dL — ABNORMAL HIGH (ref 0.44–1.00)
GFR, Estimated: 52 mL/min — ABNORMAL LOW (ref >60)
Glucose, Bld: 113 mg/dL — ABNORMAL HIGH (ref 70–99)
Potassium: 4 mmol/L (ref 3.5–5.1)
Sodium: 137 mmol/L (ref 135–145)

## 2022-01-10 LAB — CBC
HCT: 21.5 % — ABNORMAL LOW (ref 36.0–46.0)
Hemoglobin: 7.2 g/dL — ABNORMAL LOW (ref 12.0–15.0)
MCH: 32.6 pg (ref 26.0–34.0)
MCHC: 33.5 g/dL (ref 30.0–36.0)
MCV: 97.3 fL (ref 80.0–100.0)
Platelets: 141 10*3/uL — ABNORMAL LOW (ref 150–400)
RBC: 2.21 MIL/uL — ABNORMAL LOW (ref 3.87–5.11)
RDW: 16.6 % — ABNORMAL HIGH (ref 11.5–15.5)
WBC: 10.8 10*3/uL — ABNORMAL HIGH (ref 4.0–10.5)
nRBC: 0.2 % (ref 0.0–0.2)

## 2022-01-10 LAB — TECHNOLOGIST SMEAR REVIEW: Plt Morphology: NORMAL

## 2022-01-10 LAB — PREPARE RBC (CROSSMATCH)

## 2022-01-10 SURGERY — ESOPHAGOGASTRODUODENOSCOPY (EGD) WITH PROPOFOL
Anesthesia: General

## 2022-01-10 MED ORDER — ACETAMINOPHEN 325 MG PO TABS
650.0000 mg | ORAL_TABLET | Freq: Once | ORAL | Status: AC
  Administered 2022-01-10: 650 mg via ORAL
  Filled 2022-01-10: qty 2

## 2022-01-10 MED ORDER — SODIUM CHLORIDE 0.9% IV SOLUTION: Freq: Once | INTRAVENOUS | Status: AC

## 2022-01-10 NOTE — Progress Notes (Signed)
Subjective: ?3 Days Post-Op Procedure(s) (LRB): ?INTRAMEDULLARY (IM) NAIL INTERTROCHANTRIC (Left) ?Patient reports pain as mild this morning.  ?Patient is well, and has had no acute complaints or problems ?Patient became orthostatic with therapy yesterday. ?Denies any CP, SOB, ABD pain. ?We will continue therapy today.   ?Current plan is for discharge to SNF when able. ? ?Objective: ?Vital signs in last 24 hours: ?Temp:  [97.6 ?F (36.4 ?C)-99.4 ?F (37.4 ?C)] 98.3 ?F (36.8 ?C) (05/14 0802) ?Pulse Rate:  [72-97] 82 (05/14 0802) ?Resp:  [16-20] 16 (05/14 0412) ?BP: (113-128)/(57-74) 113/57 (05/14 0802) ?SpO2:  [96 %-98 %] 98 % (05/14 0802) ? ?Intake/Output from previous day: ?No intake/output data recorded. ?Intake/Output this shift: ?No intake/output data recorded. ? ?Recent Labs  ?  01/07/22 ?2030 01/08/22 ?[id number] 01/09/22 ?[id number] 01/10/22 ?[id number]  ?HGB 7.8* 6.9* 8.9* 7.2*  ? ?Recent Labs  ?  01/09/22 ?0721 01/10/22 ?[id number]  ?WBC 10.2 10.8*  ?RBC 2.76* 2.21*  ?HCT 26.5* 21.5*  ?PLT 116* 141*  ? ?Recent Labs  ?  01/09/22 ?0721 01/10/22 ?[id number]  ?NA 137 137  ?K 3.4* 4.0  ?CL 101 97*  ?CO2 30 29  ?BUN 29* 32*  ?CREATININE 0.99 1.02*  ?GLUCOSE 111* 113*  ?CALCIUM 8.2* 8.0*  ? ?No results for input(s): LABPT, INR in the last 72 hours. ? ?EXAM ?General - Patient is Alert, Appropriate, and Oriented ?Extremity - Neurovascular intact ?Sensation intact distally ?Intact pulses distally ?Dorsiflexion/Plantar flexion intact ?No cellulitis present ?Compartment soft ?No active bleeding noted to the left leg.  No ecchymosis. ?Dressing - dressing C/D/I and scant drainage ?Motor Function - intact, moving foot and toes well on exam.  ?Negative Homans bilaterally. ? ?Past Medical History:  ?Diagnosis Date  ? 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 Osteoporosis   ? Seasonal allergies   ? Vertigo   ? rare  ? ? ?Assessment/Plan:   ?3 Days Post-Op Procedure(s) (LRB): ?INTRAMEDULLARY (IM) NAIL INTERTROCHANTRIC (Left) ?Principal Problem: ?  Closed  hip fracture requiring operative repair, left, sequela ?Active Problems: ?  Orthostatic hypotension ?  Sinus bradycardia ?  Femur fracture, left ([place]) ?  Iron deficiency anemia ?  Stage 3b chronic kidney disease (CKD) ([place]) ?  Lactic acidosis ? ?Estimated body mass index is 17.83 kg/m? as calculated from the following: ?  Height as of this encounter: 5\' 8"  (1.727 m). ?  Weight as of this encounter: 53.2 kg. ?Advance diet ?Up with therapy, WBAT ? ?Work on [employer], states that she is passing gas. ?Patient became orthostatic with therapy yesterday.  She is already s/p 2 units of PRBC. ?Hg dropped down to 7.2, no active bleeding noted to the left leg. ?Discussed with internal medicine, gastroenterology has been consult for evaluation of possible GI bleed. ?Hypokalemia has resolved. ?Up with therapy today. ?Plan for discharge to SNF when able. ? ?DVT Prophylaxis - Lovenox, TED hose, and SCDs ?Weight-Bearing as tolerated to left leg ? ?Raquel Leeya Rusconi, PA-C ?[place] ?01/10/2022, 8:43 AM ?  ?

## 2022-01-10 NOTE — Progress Notes (Signed)
?Progress Note ? ? ?Patient: Cassie Solis [id number] DOB: [birth year] DOA: 01/06/2022     3 ?DOS: the patient was seen and examined on 01/10/2022 ?  ? ? ?Assessment and Plan: ?* Closed hip fracture requiring operative repair, left, sequela ?Patient had a mechanical fall and found to have a closed left intertrochanteric fracture of the left femur with slight lateral angulation.  Dr. Rudene Christians performed a intramedullary nail intertrochanteric procedure on 01/07/2022.  Pain control.  Physical therapy evaluation appreciated.  CT scan showing bleeding in the left hip and buttock area.  Case discussed with Dr. Rudene Christians orthopedic surgery and said that this can happen postoperatively.  Holding blood thinners at this time including DVT prophylaxis Lovenox injections. ? ?Iron deficiency anemia ?Outpatient hemoglobin in March was 11.3.  Came in with a hemoglobin of 9.6 and went down to 7.6 with IV fluids.  She was given a unit of packed red blood cells and hemoglobin went up only to 7.8 but then the next morning was down to 6.9.  Patient was given another unit of packed red blood cells and hemoglobin came up to 8.9.  IV iron given yesterday.  Hemoglobin dropped down to 7.2 today.  We will give a unit of packed red blood cells again (3 total this hospitalization).  CT scan showing some bleeding in the hip and buttock area. ? ?Orthostatic hypotension ?Patient with working with physical therapy got dizzy and needed to be placed in the chair.  Transfusing another unit of packed red blood cells today. ? ? ?Lactic acidosis ?Urinalysis and chest x-ray negative.  Does not seem to be related to infection.  ? ?Stage 3b chronic kidney disease (CKD) ([place]) ?Creatinine 1.26 on presentation with IV fluids came down to 1.02.  ? ?Sinus bradycardia ?Not on any rate controlling medications. ? ? ? ? ?  ? ?Subjective: Patient does complain of some soreness in her left hip.  Last bowel movement documented brown.  No nausea or vomiting.  Admitted after a  fall and had left hip fracture procedure. ? ?Physical Exam: ?Vitals:  ? 01/10/22 0412 01/10/22 0802 01/10/22 1111 01/10/22 1128  ?BP: 114/63 (!) 113/57 125/68 130/67  ?Pulse: 72 82 82 83  ?Resp: 16   16  ?Temp: 97.6 ?F (36.4 ?C) 98.3 ?F (36.8 ?C) 98 ?F (36.7 ?C) 99.1 ?F (37.3 ?C)  ?TempSrc:   Oral Oral  ?SpO2: 98% 98% 96% 98%  ?Weight:      ?Height:      ? ?Physical Exam ?HENT:  ?   Head: Normocephalic.  ?   Mouth/Throat:  ?   Pharynx: No oropharyngeal exudate.  ?Eyes:  ?   General: Lids are normal.  ?   Conjunctiva/sclera: Conjunctivae normal.  ?Cardiovascular:  ?   Rate and Rhythm: Normal rate and regular rhythm.  ?   Heart sounds: Normal heart sounds, S1 normal and S2 normal.  ?Pulmonary:  ?   Breath sounds: Normal breath sounds. No decreased breath sounds, wheezing, rhonchi or rales.  ?Abdominal:  ?   Palpations: Abdomen is soft.  ?   Tenderness: There is no abdominal tenderness.  ?Musculoskeletal:  ?   Right lower leg: No swelling.  ?   Left lower leg: No swelling.  ?Skin: ?   General: Skin is warm.  ?   Comments: Bruising on left forehead, bruises seen on arms.  ?Neurological:  ?   Mental Status: She is alert and oriented to person, place, and time.  ?   Comments: Able  to flex and extend bilateral ankles.  ?  ?Data Reviewed: ?Hemoglobin dropped down to 7.2.  Platelet count 141.  Creatinine 1.02 ? ?Family Communication: Spoke with family at the bedside ? ?Disposition: ?Status is: Inpatient ?Remains inpatient appropriate because: Transfusing another unit of blood today.  Has some bleeding in the left hip area. ? ?Planned Discharge Destination: Rehab ? ?Case discussed with orthopedic surgical team and gastroenterology. ? ?Author: ?Loletha Grayer, MD ?01/10/2022 12:53 PM ? ?For on call review [email or web address].  ?

## 2022-01-10 NOTE — Anesthesia Preprocedure Evaluation (Deleted)
Anesthesia Evaluation  ?Patient identified by MRN, date of birth, ID band ?Patient awake ? ? ? ?Reviewed: ?Allergy & Precautions, NPO status , Patient's Chart, lab work & pertinent test results ? ?History of Anesthesia Complications ?Negative for: history of anesthetic complications ? ?Airway ?Mallampati: II ? ?TM Distance: >3 FB ?Neck ROM: Full ? ? ? Dental ? ?(+) Chipped ?  ?Pulmonary ?neg pulmonary ROS, neg sleep apnea, neg COPD, Patient abstained from smoking.Not current smoker,  ?  ?Pulmonary exam normal ?breath sounds clear to auscultation ? ? ? ? ? ? Cardiovascular ?Exercise Tolerance: Good ?METS(-) hypertension(-) CAD and (-) Past MI negative cardio ROS ? ?(-) dysrhythmias  ?Rhythm:Regular Rate:Normal ?- Systolic murmurs ? ?  ?Neuro/Psych ?negative neurological ROS ? negative psych ROS  ? GI/Hepatic ?neg GERD  ,(+)  ?  ? (-) substance abuse ? ,   ?Endo/Other  ?neg diabetes ? Renal/GU ?CRFRenal disease  ? ?  ?Musculoskeletal ? ?(+) Arthritis ,  ? Abdominal ?  ?Peds ? Hematology ? ?(+) Blood dyscrasia, anemia ,   ?Anesthesia Other Findings ?Pt is s/p intramedullary nail intertrochanteric procedure on 01/07/2022 with a decreasing hemoglobin s/p transfusion. ? ?Past Medical History: ?No date: Arthritis ?    Comment:  left wrist, bilateral hips ?No date: Osteoporosis ?No date: Seasonal allergies ?No date: Vertigo ?    Comment:  rare ? Reproductive/Obstetrics ? ?  ? ? ? ? ? ? ? ? ? ? ? ? ? ?  ?  ? ? ? ? ? ? ? ? ?Anesthesia Physical ? ?Anesthesia Plan ? ?ASA: 3 ? ?Anesthesia Plan: General  ? ?Post-op Pain Management: Minimal or no pain anticipated  ? ?Induction: Intravenous ? ?PONV Risk Score and Plan: 3 and TIVA, Treatment may vary due to age or medical condition and Propofol infusion ? ?Airway Management Planned: Natural Airway ? ?Additional Equipment: None ? ?Intra-op Plan:  ? ?Post-operative Plan:  ? ?Informed Consent:  ? ?Plan Discussed with: CRNA and Surgeon ? ?Anesthesia Plan  Comments:   ? ? ? ? ? ? ?Anesthesia Quick Evaluation ? ?

## 2022-01-10 NOTE — Progress Notes (Signed)
PT Cancellation Note ? ?Patient Details ?Name: Cassie Solis ?MRN: [id number] ?DOB: Jan 18, 1929 ? ? ?Cancelled Treatment:    Reason Eval/Treat Not Completed: Medical issues which prohibited therapy ? ?Pt to receive blood transfusion today per orders and further testing.  Will resume tomorrow as appropriate. ? ? ?Chesley Noon ?01/10/2022, 10:55 AM ?

## 2022-01-10 NOTE — Plan of Care (Signed)

## 2022-01-10 NOTE — Consult Note (Signed)
?  ?Wyline Mood  Lapine , MD ?[address], [address], [place], [place], [address] ?[address], [address], [place], [place], [address] ?Phone: [phone]  ?Fax: [phone] ? Consultation ? ?Referring Provider: Dr. Valla LeaverWhieting ?Primary Care Physician:  Pcp, No ?Primary Gastroenterologist:  None         ?Reason for Consultation:     Anemia  ? ?Date of Admission:  01/06/2022 ?Date of Consultation:  01/10/2022 ?       ? HPI:   ?Cassie Solis is a 88 y.o. female presented to the hospital on 01/06/2022 after a mechanical fall left hip fracture.  Underwent surgery 3 days back with intramedullary nail ? ?I was contacted because of her low blood count.  Outpatient hemoglobin in March was 11.3 g came in with a hemoglobin of 9.6 g which went down to 7.6 g.  Given a unit of packed RBCs and went up to 7.8 and then back to 6.9.  Subsequently given another unit which went down to 8.9 g.  No overt blood loss had been noted. ? ?01/08/2022 B12 390Iron studies normal range MCV 105.  Hemoglobin this morning is 7.2 g down from 8.9 yesterday. ? ?I was contacted this morning for a further drop in hemoglobin.  No overt blood loss noted.  I spoke to the patient denies any hematemesis she suffers from macular degeneration and has impaired vision but no diarrhea reported.  No nursing reports of melena. ? ?Past Medical History:  ?Diagnosis Date  ? 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 Osteoporosis   ? Seasonal allergies   ? Vertigo   ? rare  ? ? ?Past Surgical History:  ?Procedure Laterality Date  ? ABDOMINAL HYSTERECTOMY    ? CATARACT EXTRACTION W/PHACO Left 06/25/2015  ? Procedure: CATARACT EXTRACTION PHACO AND INTRAOCULAR LENS PLACEMENT (IOC);  Surgeon: Lockie Molahadwick Brasington, MD;  [facility];  Service: Ophthalmology;  Laterality: Left;  PT PREFERS EARLY MORNING PER DR OFFICE  ? CATARACT EXTRACTION W/PHACO Right 09/10/2015  ? Procedure: CATARACT EXTRACTION PHACO AND INTRAOCULAR LENS PLACEMENT (IOC);  Surgeon: Lockie Molahadwick Brasington,  MD;  [facility];  Service: Ophthalmology;  Laterality: Right;  pt prefers early AM  ? INTRAMEDULLARY (IM) NAIL INTERTROCHANTERIC Left 01/07/2022  ? Procedure: INTRAMEDULLARY (IM) NAIL INTERTROCHANTRIC;  Surgeon: Kennedy BuckerMenz, Michael, MD;  Location: [facility];  Service: Orthopedics;  Laterality: Left;  ? ? ?Prior to Admission medications   ?Medication Sig Start Date End Date Taking? Authorizing Provider  ?aspirin 81 MG tablet Take 81 mg by mouth daily. AM   Yes [provider]  ?Calcium Citrate (CITRACAL PO) Take 1 tablet by mouth daily. AM   Yes [provider]  ?Multiple Vitamins-Minerals (PRESERVISION AREDS 2+MULTI VIT) CAPS Take 1 capsule by mouth daily.   Yes [provider]  ?meclizine (ANTIVERT) 25 MG tablet Take 25 mg by mouth as needed for dizziness. ?Patient not taking: Reported on 01/06/2022    [provider]  ? ? ?History reviewed. No pertinent family history.  ? ?Social History  ? ?Tobacco Use  ? Smoking status: Never  ?Substance Use Topics  ? Alcohol use: No  ? ? ?Allergies as of 01/06/2022 - Review Complete 01/06/2022  ?Allergen Reaction Noted  ? Azithromycin Nausea Only 01/06/2022  ? ? ?Review of Systems:    ?All systems reviewed and negative except where noted in HPI. ? ? Physical Exam:  ?Vital signs in last 24 hours: ?Temp:  [97.6 ?F (36.4 ?C)-99.4 ?F (37.4 ?  C)] 99.1 ?F (37.3 ?C) (05/14 1128) ?Pulse Rate:  [72-97] 83 (05/14 1128) ?Resp:  [16-20] 16 (05/14 1128) ?BP: (113-130)/(57-74) 130/67 (05/14 1128) ?SpO2:  [96 %-98 %] 98 % (05/14 1128) ?Last BM Date : 01/09/22 ?General:   Pleasant, cooperative in NAD ?Head:  Normocephalic and atraumatic. ?Eyes:   No icterus.   Conjunctiva pink. PERRLA. ?Ears:  Normal auditory acuity. ?Neck:  Supple; no masses or thyroidomegaly ?Lungs: Respirations even and unlabored. Lungs clear to auscultation bilaterally.   No wheezes, crackles, or rhonchi.  ?Heart:  Regular rate and rhythm;  Without murmur, clicks, rubs or  gallops ?Abdomen:  Soft, nondistended, nontender. Normal bowel sounds. No appreciable masses or hepatomegaly.  No rebound or guarding.  ?Neurologic:  Alert and oriented x3;  grossly normal neurologically. ?Skin:  Intact without significant lesions or rashes. ?Psych:  Alert and cooperative. Normal affect. ?Lower extremities left extremity appears to be significantly swollen from the level of the hip up to the knee.  The skin appears tight and shiny.  Tenderness on palpation on the lateral aspect over the trochanteric bursa.  Right lower extremity appears normal. ?LAB RESULTS: ?Recent Labs  ?  01/08/22 ?0616 01/09/22 ?0721 01/10/22 ?3662  ?WBC 9.6 10.2 10.8*  ?HGB 6.9* 8.9* 7.2*  ?HCT 20.9* 26.5* 21.5*  ?PLT 117* 116* 141*  ? ?BMET ?Recent Labs  ?  01/08/22 ?0616 01/09/22 ?0721 01/10/22 ?9476  ?NA 138 137 137  ?K 4.5 3.4* 4.0  ?CL 104 101 97*  ?CO2 30 30 29   ?GLUCOSE 134* 111* 113*  ?BUN 33* 29* 32*  ?CREATININE 1.16* 0.99 1.02*  ?CALCIUM 8.0* 8.2* 8.0*  ? ?LFT ?No results for input(s): PROT, ALBUMIN, AST, ALT, ALKPHOS, BILITOT, BILIDIR, IBILI in the last 72 hours. ?PT/INR ?No results for input(s): LABPROT, INR in the last 72 hours. ? ?STUDIES: ?CT ABDOMEN PELVIS WO CONTRAST ? ?Addendum Date: 01/10/2022   ?ADDENDUM REPORT: 01/10/2022 11:31 ADDENDUM: Gas is present in the lumen of the urinary bladder. This suggests recent instrumentation. In the absence of recent bladder instrumentation, infection should be considered. Electronically Signed   By: 01/12/2022 M.D.   On: 01/10/2022 11:31  ? ?Result Date: 01/10/2022 ?CLINICAL DATA:  Dropping hemoglobin.  Status post fall. EXAM: CT ABDOMEN AND PELVIS WITHOUT CONTRAST TECHNIQUE: Multidetector CT imaging of the abdomen and pelvis was performed following the standard protocol without IV contrast. RADIATION DOSE REDUCTION: This exam was performed according to the departmental dose-optimization program which includes automated exposure control, adjustment of the mA and/or kV  according to patient size and/or use of iterative reconstruction technique. COMPARISON:  None Available. FINDINGS: Lower chest: Unremarkable. Hepatobiliary: No suspicious focal abnormality in the liver on this study without intravenous contrast. Gallbladder is distended. No intrahepatic or extrahepatic biliary dilation. Pancreas: No focal mass lesion. No dilatation of the main duct. No intraparenchymal cyst. No peripancreatic edema. Spleen: No splenomegaly. No focal mass lesion. Adrenals/Urinary Tract: No adrenal nodule or mass. Kidneys unremarkable. No evidence for hydroureter. The urinary bladder appears normal for the degree of distention. Stomach/Bowel: Stomach is unremarkable. No gastric wall thickening. No evidence of outlet obstruction. Duodenum is normally positioned as is the ligament of Treitz. No small bowel wall thickening. No small bowel dilatation. The terminal ileum is normal. The appendix is normal. No gross colonic mass. No colonic wall thickening. Vascular/Lymphatic: There is moderate atherosclerotic calcification of the abdominal aorta without aneurysm. There is no gastrohepatic or hepatoduodenal ligament lymphadenopathy. No retroperitoneal or mesenteric lymphadenopathy. No pelvic sidewall lymphadenopathy. Reproductive:  Uterus surgically absent. 10.4 x 5.8 x 8.0 cm multicystic lesion is identified in the left adnexal space. Other: No intraperitoneal free fluid. No retroperitoneal hemorrhage. Musculoskeletal: Extensive edema/hemorrhage identified in the soft tissues around the left hip. Gas in the soft tissues of the left hip region and left buttocks is compatible with recent surgery for ORIF of proximal left femur fracture. No discrete soft tissue hematoma evident. Subcutaneous edema noted in the pelvis, left greater than right, and left thigh. IMPRESSION: 1. Extensive edema/hemorrhage in the soft tissues around the left hip region and left buttocks. Gas in the soft tissues of the left hip region  and left buttocks is compatible with recent surgery for ORIF of proximal left femur fracture. No discrete soft tissue hematoma evident. No retroperitoneal hematoma to explain the patient's history of decreas

## 2022-01-11 DIAGNOSIS — S72002S Fracture of unspecified part of neck of left femur, sequela: Secondary | ICD-10-CM | POA: Diagnosis not present

## 2022-01-11 DIAGNOSIS — D62 Acute posthemorrhagic anemia: Secondary | ICD-10-CM

## 2022-01-11 DIAGNOSIS — N9489 Other specified conditions associated with female genital organs and menstrual cycle: Secondary | ICD-10-CM

## 2022-01-11 DIAGNOSIS — I951 Orthostatic hypotension: Secondary | ICD-10-CM | POA: Diagnosis not present

## 2022-01-11 LAB — TYPE AND SCREEN
ABO/RH(D): A POS
Antibody Screen: NEGATIVE
Unit division: 0

## 2022-01-11 LAB — BASIC METABOLIC PANEL
Anion gap: 7 (ref 5–15)
BUN: 29 mg/dL — ABNORMAL HIGH (ref 8–23)
CO2: 30 mmol/L (ref 22–32)
Calcium: 7.9 mg/dL — ABNORMAL LOW (ref 8.9–10.3)
Chloride: 103 mmol/L (ref 98–111)
Creatinine, Ser: 0.94 mg/dL (ref 0.44–1.00)
GFR, Estimated: 58 mL/min — ABNORMAL LOW (ref >60)
Glucose, Bld: 106 mg/dL — ABNORMAL HIGH (ref 70–99)
Potassium: 3.9 mmol/L (ref 3.5–5.1)
Sodium: 140 mmol/L (ref 135–145)

## 2022-01-11 LAB — CBC
HCT: 25.2 % — ABNORMAL LOW (ref 36.0–46.0)
Hemoglobin: 8.4 g/dL — ABNORMAL LOW (ref 12.0–15.0)
MCH: 32.4 pg (ref 26.0–34.0)
MCHC: 33.3 g/dL (ref 30.0–36.0)
MCV: 97.3 fL (ref 80.0–100.0)
Platelets: 155 K/uL (ref 150–400)
RBC: 2.59 MIL/uL — ABNORMAL LOW (ref 3.87–5.11)
RDW: 16.1 % — ABNORMAL HIGH (ref 11.5–15.5)
WBC: 9.2 K/uL (ref 4.0–10.5)
nRBC: 0.2 % (ref 0.0–0.2)

## 2022-01-11 LAB — BPAM RBC
Blood Product Expiration Date: 202306102359
ISSUE DATE / TIME: 202305141113
Unit Type and Rh: 6200

## 2022-01-11 LAB — LACTATE DEHYDROGENASE: LDH: 311 U/L — ABNORMAL HIGH (ref 98–192)

## 2022-01-11 MED ORDER — FE FUMARATE-B12-VIT C-FA-IFC PO CAPS
1.0000 | ORAL_CAPSULE | Freq: Two times a day (BID) | ORAL | Status: DC
  Administered 2022-01-11 – 2022-01-12 (×3): 1 via ORAL
  Filled 2022-01-11 (×4): qty 1

## 2022-01-11 MED ORDER — OCUVITE-LUTEIN PO CAPS
1.0000 | ORAL_CAPSULE | Freq: Every day | ORAL | Status: DC
  Administered 2022-01-11 – 2022-01-12 (×2): 1 via ORAL
  Filled 2022-01-11 (×2): qty 1

## 2022-01-11 NOTE — Assessment & Plan Note (Signed)
10.4 x 5.8 x 8.0 cm multicystic lesion left adnexal space.  The patient states that she is 88 years old and does not want any further work-up for this regardless of what it is. ?

## 2022-01-11 NOTE — Progress Notes (Signed)
Subjective: ?4 Days Post-Op Procedure(s) (LRB): ?INTRAMEDULLARY (IM) NAIL INTERTROCHANTRIC (Left) ?Patient reports pain as mild this morning.  ?Patient is well, and has had no acute complaints or problems ?Blood pressure stable, overall pain improving. ?Denies any CP, SOB, ABD pain. ?We will continue therapy today.   ?Current plan is for discharge to SNF when able. ? ?Objective: ?Vital signs in last 24 hours: ?Temp:  [97.9 ?F (36.6 ?C)-99.1 ?F (37.3 ?C)] 98.7 ?F (37.1 ?C) (05/14 2013) ?Pulse Rate:  [id number] (05/14 2013) ?Resp:  [16-18] 17 (05/14 2013) ?BP: (125-142)/(67-73) 142/72 (05/14 2013) ?SpO2:  [95 %-99 %] 95 % (05/14 2013) ? ?Intake/Output from previous day: ?05/14 0701 - 05/15 0700 ?In: 402 [Blood:402] ?Out: 200 [Urine:200] ?Intake/Output this shift: ?No intake/output data recorded. ? ?Recent Labs  ?  01/09/22 ?0721 01/10/22 ?[id number] 01/11/22 ?0455  ?HGB 8.9* 7.2* 8.4*  ? ?Recent Labs  ?  01/10/22 ?[id number] 01/11/22 ?0455  ?WBC 10.8* 9.2  ?RBC 2.21* 2.59*  ?HCT 21.5* 25.2*  ?PLT 141* 155  ? ?Recent Labs  ?  01/10/22 ?[id number] 01/11/22 ?0455  ?NA 137 140  ?K 4.0 3.9  ?CL 97* 103  ?CO2 29 30  ?BUN 32* 29*  ?CREATININE 1.02* 0.94  ?GLUCOSE 113* 106*  ?CALCIUM 8.0* 7.9*  ? ?No results for input(s): LABPT, INR in the last 72 hours. ? ?EXAM ?General - Patient is Alert, Appropriate, and Oriented ?Extremity - Neurovascular intact ?Sensation intact distally ?Intact pulses distally ?Dorsiflexion/Plantar flexion intact ?No cellulitis present ?Compartment soft ?No active bleeding noted to the left leg.  No ecchymosis. ?Dressing - dressing C/D/I and scant drainage ?Motor Function - intact, moving foot and toes well on exam.  ?Negative Homans bilaterally. ? ?Past Medical History:  ?Diagnosis Date  ? 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 Osteoporosis   ? Seasonal allergies   ? Vertigo   ? rare  ? ? ?Assessment/Plan:   ?4 Days Post-Op Procedure(s) (LRB): ?INTRAMEDULLARY (IM) NAIL INTERTROCHANTRIC (Left) ?Principal Problem: ?   Closed hip fracture requiring operative repair, left, sequela ?Active Problems: ?  Orthostatic hypotension ?  Sinus bradycardia ?  Femur fracture, left ([place]) ?  Iron deficiency anemia ?  Stage 3b chronic kidney disease (CKD) ([place]) ?  Lactic acidosis ? ?Estimated body mass index is 17.83 kg/m? as calculated from the following: ?  Height as of this encounter: 5\' 8"  (1.727 m). ?  Weight as of this encounter: 53.2 kg. ?Advance diet ?Up with therapy, WBAT ? ?Vital signs stable ? ?Hemoglobin up to 8.4.  Continue with iron supplement.  Recheck hemoglobin in the morning ? ?Up with therapy today. ? ?Plan for discharge to SNF when able. ? ? ?Patient will need follow-up with [facility] orthopedics in 2 weeks ?TED hose bilateral lower extremity x6 weeks ? ? ?DVT Prophylaxis - TED hose and SCDs ?Weight-Bearing as tolerated to left leg ? ?T. Rachelle Hora, PA-C ?[place] ?01/11/2022, 8:02 AM ?  ?

## 2022-01-11 NOTE — Assessment & Plan Note (Signed)
Outpatient hemoglobin 11.3.  Patient transfused 3 units of packed red blood cells and IV iron during the hospital course.  Today's hemoglobin 8.4.  We will check hemoglobin 1 more day.  CT scan showing some bleeding around the left hip and buttock area. ?

## 2022-01-11 NOTE — Progress Notes (Signed)
Physical Therapy Treatment ?Patient Details ?Name: Cassie Solis ?MRN: [id number] ?DOB: [birth year] ?Today's Date: 01/11/2022 ? ? ?History of Present Illness Pt is a 88 y/o F admitted on 01/06/22 after presenting with c/o fal & L hip pain. X ray revealed xray comminuted intertrochanteric fracture of the left femur with slight lateral angulation. Pt underwent sx for L IM nail placement by Dr. Rudene Christians on 01/07/22. PMH: vertigo, osteoporosis, arthritis ? ?  ?PT Comments  ? ? In chair upon arrival.  Participated in exercises as described below.  Stood x 2 and walked x 10 ' x 2 with RW and min assist with recliner follow provided by son.  No dizziness reported today.  Excellent motivation and improvement today over prior sessions. ?  ?Recommendations for follow up therapy are one component of a multi-disciplinary discharge planning process, led by the attending physician.  Recommendations may be updated based on patient status, additional functional criteria and insurance authorization. ? ?Follow Up Recommendations ? Skilled nursing-short term rehab (<3 hours/day) ?  ?  ?Assistance Recommended at Discharge Frequent or constant Supervision/Assistance  ?Patient can return home with the following A little help with walking and/or transfers;A little help with bathing/dressing/bathroom;Assistance with cooking/housework;Help with stairs or ramp for entrance;Assist for transportation ?  ?Equipment Recommendations ? Rolling walker (2 wheels);BSC/3in1  ?  ?Recommendations for Other Services   ? ? ?  ?Precautions / Restrictions Precautions ?Precautions: Fall ?Restrictions ?Weight Bearing Restrictions: Yes ?LLE Weight Bearing: Weight bearing as tolerated  ?  ? ?Mobility ? Bed Mobility ?  ?  ?  ?  ?  ?  ?  ?General bed mobility comments: in recliner before and after ?  ? ?Transfers ?Overall transfer level: Needs assistance ?Equipment used: Rolling walker (2 wheels) ?Transfers: Sit to/from Stand ?Sit to Stand: Min assist, Mod assist ?  ?  ?   ?  ?  ?  ?  ? ?Ambulation/Gait ?Ambulation/Gait assistance: Min assist ?Gait Distance (Feet): 10 Feet ?Assistive device: Rolling walker (2 wheels) ?Gait Pattern/deviations: Step-to pattern, Decreased step length - right, Decreased step length - left, Decreased stance time - left ?Gait velocity: dec ?  ?  ?General Gait Details: 10' x 2 with slow generally unsteady gait but overall does quite well ? ? ?Stairs ?  ?  ?  ?  ?  ? ? ?Wheelchair Mobility ?  ? ?Modified Rankin (Stroke Patients Only) ?  ? ? ?  ?Balance Overall balance assessment: Needs assistance ?Sitting-balance support: Feet supported, Bilateral upper extremity supported ?Sitting balance-Leahy Scale: Fair ?  ?  ?Standing balance support: Bilateral upper extremity supported, During functional activity ?Standing balance-Leahy Scale: Poor ?Standing balance comment: min a x 1 at all times for balance ?  ?  ?  ?  ?  ?  ?  ?  ?  ?  ?  ?  ? ?  ?Cognition Arousal/Alertness: Awake/alert ?Behavior During Therapy: [facility] for tasks assessed/performed ?Overall Cognitive Status: Within Functional Limits for tasks assessed ?  ?  ?  ?  ?  ?  ?  ?  ?  ?  ?  ?  ?  ?  ?  ?  ?  ?  ?  ? ?  ?Exercises Other Exercises ?Other Exercises: supine, seated and standing AROM x 10 with assist for full ROM as needed ? ?  ?General Comments   ?  ?  ? ?Pertinent Vitals/Pain Pain Assessment ?Pain Assessment: Faces ?Faces Pain Scale: Hurts a little bit ?Pain Location: L  hip ?Pain Descriptors / Indicators: Grimacing, Discomfort ?Pain Intervention(s): Premedicated before session, Monitored during session, Limited activity within patient's tolerance, Repositioned  ? ? ?Home Living   ?  ?  ?  ?  ?  ?  ?  ?  ?  ?   ?  ?Prior Function    ?  ?  ?   ? ?PT Goals (current goals can now be found in the care plan section) Progress towards PT goals: Progressing toward goals ? ?  ?Frequency ? ? ? BID ? ? ? ?  ?PT Plan Current plan remains appropriate  ? ? ?Co-evaluation   ?  ?  ?  ?  ? ?  ?AM-PAC PT "6  Clicks" Mobility   ?Outcome Measure ? Help needed turning from your back to your side while in a flat bed without using bedrails?: A Little ?Help needed moving from lying on your back to sitting on the side of a flat bed without using bedrails?: A Little ?Help needed moving to and from a bed to a chair (including a wheelchair)?: A Little ?  ?Help needed to walk in hospital room?: A Little ?Help needed climbing 3-5 steps with a railing? : Total ?6 Click Score: 13 ? ?  ?End of Session Equipment Utilized During Treatment: Gait belt ?Activity Tolerance: Patient tolerated treatment well ?Patient left: in chair;with chair alarm set;with call bell/phone within reach;with family/visitor present ?Nurse Communication: Mobility status ?PT Visit Diagnosis: Unsteadiness on feet (R26.81);Difficulty in walking, not elsewhere classified (R26.2);Muscle weakness (generalized) (M62.81);Pain ?Pain - Right/Left: Left ?Pain - part of body: Hip ?  ? ? ?Time: 1010-1025 ?PT Time Calculation (min) (ACUTE ONLY): 15 min ? ?Charges:  $Gait Training: 8-22 mins          ?         Chesley Noon, PTA ?01/11/22, 10:34 AM\ ? ?

## 2022-01-11 NOTE — Progress Notes (Signed)
?Progress Note ? ? ?Patient: Cassie Solis KMQ:[id number] DOB: 1929-01-25 DOA: 01/06/2022     4 ?DOS: the patient was seen and examined on 01/11/2022 ?  ? ? ?Assessment and Plan: ?* Closed hip fracture requiring operative repair, left, sequela ?Patient had a mechanical fall and found to have a closed left intertrochanteric fracture of the left femur with slight lateral angulation.  Dr. Rosita Kea performed a intramedullary nail intertrochanteric procedure on 01/07/2022.  Pain control.  Physical therapy evaluation appreciated.  CT scan showing bleeding in the left hip and buttock area.  Holding blood thinners at this time including DVT prophylaxis Lovenox injections.  Watch hemoglobin another day prior to disposition. ? ?Acute blood loss anemia ?Outpatient hemoglobin 11.3.  Patient transfused 3 units of packed red blood cells and IV iron during the hospital course.  Today's hemoglobin 8.4.  We will check hemoglobin 1 more day.  CT scan showing some bleeding around the left hip and buttock area. ? ?Orthostatic hypotension ?Patient feeling better with moving around today.  Received 3 units of packed red blood cells during the hospital course. ? ? ?Adnexal mass ?10.4 x 5.8 x 8.0 cm multicystic lesion left adnexal space.  The patient states that she is 88 years old and does not want any further work-up for this regardless of what it is. ? ?Lactic acidosis ?Urinalysis and chest x-ray negative.  Does not seem to be related to infection.  ? ?Stage 3b chronic kidney disease (CKD) ([facility]) ?Creatinine 1.26 on presentation with IV fluids came down to 0.94.  ? ?Sinus bradycardia ?Not on any rate controlling medications. ? ? ? ? ?  ? ?Subjective: Patient feeling better today.  Did not feel lightheaded or dizzy when getting up to sit in the chair.  Has some hip pain.  Came in after a fall and found to have a hip fracture. ? ?Physical Exam: ?Vitals:  ? 01/10/22 1437 01/10/22 1546 01/10/22 2013 01/11/22 0837  ?BP: 128/73 129/71 (!) 142/72  108/66  ?Pulse: 92 75 77 70  ?Resp: 18  17 16   ?Temp: 98.3 ?F (36.8 ?C) 97.9 ?F (36.6 ?C) 98.7 ?F (37.1 ?C) 99.3 ?F (37.4 ?C)  ?TempSrc: Oral     ?SpO2: 99% 96% 95% 96%  ?Weight:      ?Height:      ? ?Physical Exam ?HENT:  ?   Head: Normocephalic.  ?   Mouth/Throat:  ?   Pharynx: No oropharyngeal exudate.  ?Eyes:  ?   General: Lids are normal.  ?   Conjunctiva/sclera: Conjunctivae normal.  ?Cardiovascular:  ?   Rate and Rhythm: Normal rate and regular rhythm.  ?   Heart sounds: Normal heart sounds, S1 normal and S2 normal.  ?Pulmonary:  ?   Breath sounds: Normal breath sounds. No decreased breath sounds, wheezing, rhonchi or rales.  ?Abdominal:  ?   Palpations: Abdomen is soft.  ?   Tenderness: There is no abdominal tenderness.  ?Musculoskeletal:  ?   Right lower leg: No swelling.  ?   Left lower leg: No swelling.  ?Skin: ?   General: Skin is warm.  ?   Comments: Bruising on left forehead, bruises seen on arms.  ?Neurological:  ?   Mental Status: She is alert and oriented to person, place, and time.  ?   Comments: Able to flex and extend bilateral ankles.  ?  ?Data Reviewed: ?Hemoglobin 8.4, LDH 311, creatinine 0.94 with a GFR 58 ? ?Family Communication: Spoke with son at the bedside ? ?  Disposition: ?Status is: Inpatient ?Remains inpatient appropriate because: 1 watch the hemoglobin above day to make sure stable prior to discharge out to rehab (needed to transfuse 3 units of packed red blood cells during the hospital course). ? ?Planned Discharge Destination: Rehab ? ? ?Author: ?Loletha Grayer, MD ?01/11/2022 2:28 PM ? ?For on call review [email or web address].  ?

## 2022-01-11 NOTE — Progress Notes (Signed)
Physical Therapy Treatment ?Patient Details ?Name: Cassie Solis ?MRN: [id number] ?DOB: [birth year] ?Today's Date: 01/11/2022 ? ? ?History of Present Illness Pt is a 88 y/o F admitted on 01/06/22 after presenting with c/o fal & L hip pain. X ray revealed xray comminuted intertrochanteric fracture of the left femur with slight lateral angulation. Pt underwent sx for L IM nail placement by Dr. Rosita Kea on 01/07/22. PMH: vertigo, osteoporosis, arthritis ? ?  ?PT Comments  ? ? Pt remained in chair since AM session.  Ready to get up to void then back to bed.  She is able to transition with min a x 1 and return to bed with assist to elevate LE's on bed.  Fatigued with activity today but overall progressing well ?  ?Recommendations for follow up therapy are one component of a multi-disciplinary discharge planning process, led by the attending physician.  Recommendations may be updated based on patient status, additional functional criteria and insurance authorization. ? ?Follow Up Recommendations ? Skilled nursing-short term rehab (<3 hours/day) ?  ?  ?Assistance Recommended at Discharge Frequent or constant Supervision/Assistance  ?Patient can return home with the following A little help with walking and/or transfers;A little help with bathing/dressing/bathroom;Assistance with cooking/housework;Help with stairs or ramp for entrance;Assist for transportation ?  ?Equipment Recommendations ? Rolling walker (2 wheels);BSC/3in1  ?  ?Recommendations for Other Services   ? ? ?  ?Precautions / Restrictions Precautions ?Precautions: Fall ?Restrictions ?Weight Bearing Restrictions: Yes ?LLE Weight Bearing: Weight bearing as tolerated  ?  ? ?Mobility ? Bed Mobility ?Overal bed mobility: Needs Assistance ?Bed Mobility: Sit to Supine ?  ?  ?  ?Sit to supine: Mod assist, HOB elevated ?  ?General bed mobility comments: in recliner before and after ?  ? ?Transfers ?Overall transfer level: Needs assistance ?Equipment used: Rolling walker (2  wheels) ?Transfers: Sit to/from Stand ?Sit to Stand: Min assist ?  ?  ?  ?  ?  ?  ?  ? ?Ambulation/Gait ?Ambulation/Gait assistance: Min assist ?Gait Distance (Feet): 3 Feet ?Assistive device: Rolling walker (2 wheels) ?Gait Pattern/deviations: Step-to pattern, Decreased step length - right, Decreased step length - left, Decreased stance time - left ?Gait velocity: dec ?  ?  ?General Gait Details: transfer to commode to void then back to bed ? ? ?Stairs ?  ?  ?  ?  ?  ? ? ?Wheelchair Mobility ?  ? ?Modified Rankin (Stroke Patients Only) ?  ? ? ?  ?Balance Overall balance assessment: Needs assistance ?Sitting-balance support: Feet supported, Bilateral upper extremity supported ?Sitting balance-Leahy Scale: Good ?  ?  ?Standing balance support: Bilateral upper extremity supported, During functional activity ?Standing balance-Leahy Scale: Poor ?Standing balance comment: min a x 1 at all times for balance ?  ?  ?  ?  ?  ?  ?  ?  ?  ?  ?  ?  ? ?  ?Cognition Arousal/Alertness: Awake/alert ?Behavior During Therapy: [facility] for tasks assessed/performed ?Overall Cognitive Status: Within Functional Limits for tasks assessed ?  ?  ?  ?  ?  ?  ?  ?  ?  ?  ?  ?  ?  ?  ?  ?  ?General Comments: Pleasant and motivated for therapy ?  ?  ? ?  ?Exercises Other Exercises ?Other Exercises: supine, seated and standing AROM x 10 with assist for full ROM as needed ? ?  ?General Comments   ?  ?  ? ?Pertinent Vitals/Pain Pain Assessment ?Pain  Assessment: Faces ?Faces Pain Scale: Hurts little more ?Pain Location: L hip ?Pain Descriptors / Indicators: Grimacing, Discomfort ?Pain Intervention(s): Limited activity within patient's tolerance, Monitored during session  ? ? ?Home Living   ?  ?  ?  ?  ?  ?  ?  ?  ?  ?   ?  ?Prior Function    ?  ?  ?   ? ?PT Goals (current goals can now be found in the care plan section) Progress towards PT goals: Progressing toward goals ? ?  ?Frequency ? ? ? BID ? ? ? ?  ?PT Plan Current plan remains appropriate   ? ? ?Co-evaluation   ?  ?  ?  ?  ? ?  ?AM-PAC PT "6 Clicks" Mobility   ?Outcome Measure ? Help needed turning from your back to your side while in a flat bed without using bedrails?: A Little ?Help needed moving from lying on your back to sitting on the side of a flat bed without using bedrails?: A Little ?Help needed moving to and from a bed to a chair (including a wheelchair)?: A Little ?Help needed standing up from a chair using your arms (e.g., wheelchair or bedside chair)?: A Little ?Help needed to walk in hospital room?: A Little ?Help needed climbing 3-5 steps with a railing? : Total ?6 Click Score: 16 ? ?  ?End of Session Equipment Utilized During Treatment: Gait belt ?Activity Tolerance: Patient tolerated treatment well ?Patient left: in chair;with chair alarm set;with call bell/phone within reach;with family/visitor present ?Nurse Communication: Mobility status ?PT Visit Diagnosis: Unsteadiness on feet (R26.81);Difficulty in walking, not elsewhere classified (R26.2);Muscle weakness (generalized) (M62.81);Pain ?Pain - Right/Left: Left ?Pain - part of body: Hip ?  ? ? ?Time: 0156-0206 ?PT Time Calculation (min) (ACUTE ONLY): 10 min ? ?Charges:  $Therapeutic Activity: 8-22 mins          ?         Danielle Dess, PTA ?01/11/22, 4:00 PM ? ?

## 2022-01-11 NOTE — Care Management Important Message (Signed)
Important Message ? ?Patient Details  ?Name: Cassie Solis ?MRN: [id number] ?Date of Birth: 1929-08-18 ? ? ?Medicare Important Message Given:  Yes ? ? ? ? ?Johnell Comings ?01/11/2022, 11:04 AM ?

## 2022-01-11 NOTE — Progress Notes (Signed)
Occupational Therapy Treatment ?Patient Details ?Name: Cassie Solis ?MRN: [id number] ?DOB: 27-Dec-1928 ?Today's Date: 01/11/2022 ? ? ?History of present illness Pt is a 88 y/o F admitted on 01/06/22 after presenting with c/o fal & L hip pain. X ray revealed xray comminuted intertrochanteric fracture of the left femur with slight lateral angulation. Pt underwent sx for L IM nail placement by Dr. Rosita Kea on 01/07/22. PMH: vertigo, osteoporosis, arthritis ?  ?OT comments ? Cassie Solis is making good progress toward her functional goals.  She continues to be limited by generalized weakness, impaired balance, and ROM deficits that impacts her engagement in self care tasks.  She was pleasant and motivated for treatment this date.  OT provided setup assist for seated grooming tasks (washing face, brushing teeth).  OT provided min assist for sponge bathing, though pt declined washing B feet.  Anticipate pt would require higher level of assist to wash feet.  Pt required min assist in sit <> stand transfer and standing balance with RW as pt completed perihygiene in sponge bath.  She required unilateral upper extremity support (as well as min A from OT) while standing.  Cassie Solis reported fatigue after ADL engagement, but reported feeling much better after completing ADLs and denied pain or dizziness.  She will continue to benefit from skilled OT services in acute setting to support functional strengthening, balance, and safety and independence in ADLs.  Discharge recommendation remains appropriate.  ? ?Recommendations for follow up therapy are one component of a multi-disciplinary discharge planning process, led by the attending physician.  Recommendations may be updated based on patient status, additional functional criteria and insurance authorization. ?   ?Follow Up Recommendations ? Skilled nursing-short term rehab (<3 hours/day)  ?  ?Assistance Recommended at Discharge Frequent or constant Supervision/Assistance  ?Patient can  return home with the following ? A lot of help with walking and/or transfers;A lot of help with bathing/dressing/bathroom;Assistance with cooking/housework;Help with stairs or ramp for entrance ?  ?Equipment Recommendations ? BSC/3in1  ?  ?Recommendations for Other Services   ? ?  ?Precautions / Restrictions Precautions ?Precautions: Fall ?Restrictions ?Weight Bearing Restrictions: Yes ?LLE Weight Bearing: Weight bearing as tolerated  ? ? ?  ? ?Mobility Bed Mobility ?  ?  ?  ?  ?  ?  ?  ?  ?Patient Response: Cooperative ? ?Transfers ?Overall transfer level: Needs assistance ?Equipment used: Rolling walker (2 wheels) ?Transfers: Sit to/from Stand ?Sit to Stand: Min assist ?  ?  ?  ?  ?  ?  ?  ?  ?Balance Overall balance assessment: Needs assistance ?Sitting-balance support: Feet supported, Bilateral upper extremity supported ?Sitting balance-Leahy Scale: Good ?  ?  ?Standing balance support: Bilateral upper extremity supported, During functional activity ?Standing balance-Leahy Scale: Poor ?Standing balance comment: min a x 1 at all times for balance ?  ?  ?  ?  ?  ?  ?  ?  ?  ?  ?  ?   ? ?ADL either performed or assessed with clinical judgement  ? ?ADL Overall ADL's : Needs assistance/impaired ?Eating/Feeding: Sitting;Set up ?  ?Grooming: Set up;Wash/dry face;Oral care ?  ?Upper Body Bathing: Set up;Sitting ?  ?Lower Body Bathing: Minimal assistance;Sit to/from stand ?Lower Body Bathing Details (indicate cue type and reason): OT provided min A for standing balance, with pt able to perform bathing on own with unilateral UE support on RW ?Upper Body Dressing : Set up;Sitting ?  ?  ?  ?  ?  ?  ?  ?  ?  ?  ?  General ADL Comments: provided min A for sit <> stand and standing balance with RW ?  ? ?Extremity/Trunk Assessment Upper Extremity Assessment ?Upper Extremity Assessment: Overall WFL for tasks assessed ?  ?Lower Extremity Assessment ?Lower Extremity Assessment: Generalized weakness ?  ?  ?  ? ?Vision Patient Visual  Report: No change from baseline ?  ?  ?Perception   ?  ?Praxis   ?  ? ?Cognition Arousal/Alertness: Awake/alert ?Behavior During Therapy: [facility] for tasks assessed/performed ?Overall Cognitive Status: Within Functional Limits for tasks assessed ?  ?  ?  ?  ?  ?  ?  ?  ?  ?  ?  ?  ?  ?  ?  ?  ?General Comments: Pleasant and motivated for therapy ?  ?  ?   ?Exercises Other Exercises ?Other Exercises: provided setup assist for grooming and bathing, min A for sit to stand transfers with RW ? ?  ?Shoulder Instructions   ? ? ?  ?General Comments    ? ? ?Pertinent Vitals/ Pain       Pain Assessment ?Pain Assessment: No/denies pain ?Pain Intervention(s): Limited activity within patient's tolerance, Monitored during session ? ?Home Living   ?  ?  ?  ?  ?  ?  ?  ?  ?  ?  ?  ?  ?  ?  ?  ?  ?  ?  ? ?  ?Prior Functioning/Environment    ?  ?  ?  ?   ? ?Frequency ? Min 2X/week  ? ? ? ? ?  ?Progress Toward Goals ? ?OT Goals(current goals can now be found in the care plan section) ? Progress towards OT goals: Progressing toward goals ? ?Acute Rehab OT Goals ?Patient Stated Goal: to regain independence ?OT Goal Formulation: With patient/family ?Time For Goal Achievement: 01/22/22 ?Potential to Achieve Goals: Good  ?Plan Discharge plan remains appropriate;Frequency remains appropriate   ? ?Co-evaluation ? ? ?   ?  ?  ?  ?  ? ?  ?AM-PAC OT "6 Clicks" Daily Activity     ?Outcome Measure ? ? Help from another person eating meals?: None ?Help from another person taking care of personal grooming?: A Little ?Help from another person toileting, which includes using toliet, bedpan, or urinal?: A Lot ?Help from another person bathing (including washing, rinsing, drying)?: A Little ?Help from another person to put on and taking off regular upper body clothing?: None ?Help from another person to put on and taking off regular lower body clothing?: A Lot ?6 Click Score: 18 ? ?  ?End of Session Equipment Utilized During Treatment: Gait belt;Rolling  walker (2 wheels) ? ?OT Visit Diagnosis: Unsteadiness on feet (R26.81);History of falling (Z91.81);Muscle weakness (generalized) (M62.81) ?  ?Activity Tolerance Patient tolerated treatment well ?  ?Patient Left in chair;with call bell/phone within reach;with chair alarm set ?  ?Nurse Communication   ?  ? ?   ? ?Time: 6812-7517 ?OT Time Calculation (min): 32 min ? ?Charges: OT General Charges ?$OT Visit: 1 Visit ?OT Treatments ?$Self Care/Home Management : 23-37 mins ? ?Dennison Nancy, OTR/L ?01/11/22, 12:18 PM ? ?

## 2022-01-11 NOTE — Plan of Care (Signed)

## 2022-01-12 DIAGNOSIS — D62 Acute posthemorrhagic anemia: Secondary | ICD-10-CM | POA: Diagnosis not present

## 2022-01-12 DIAGNOSIS — N9489 Other specified conditions associated with female genital organs and menstrual cycle: Secondary | ICD-10-CM | POA: Diagnosis not present

## 2022-01-12 DIAGNOSIS — S72002S Fracture of unspecified part of neck of left femur, sequela: Secondary | ICD-10-CM | POA: Diagnosis not present

## 2022-01-12 DIAGNOSIS — I951 Orthostatic hypotension: Secondary | ICD-10-CM | POA: Diagnosis not present

## 2022-01-12 LAB — CBC
HCT: 27.2 % — ABNORMAL LOW (ref 36.0–46.0)
Hemoglobin: 8.9 g/dL — ABNORMAL LOW (ref 12.0–15.0)
MCH: 32.6 pg (ref 26.0–34.0)
MCHC: 32.7 g/dL (ref 30.0–36.0)
MCV: 99.6 fL (ref 80.0–100.0)
Platelets: 190 10*3/uL (ref 150–400)
RBC: 2.73 MIL/uL — ABNORMAL LOW (ref 3.87–5.11)
RDW: 16 % — ABNORMAL HIGH (ref 11.5–15.5)
WBC: 10 10*3/uL (ref 4.0–10.5)
nRBC: 0 % (ref 0.0–0.2)

## 2022-01-12 MED ORDER — HYDROCODONE-ACETAMINOPHEN 5-325 MG PO TABS: 1.0000 | ORAL_TABLET | Freq: Four times a day (QID) | ORAL | 0 refills | Status: DC | PRN

## 2022-01-12 MED ORDER — POLYETHYLENE GLYCOL 3350 17 G PO PACK: 17.0000 g | PACK | Freq: Every day | ORAL | 0 refills | Status: AC | PRN

## 2022-01-12 MED ORDER — ACETAMINOPHEN 325 MG PO TABS: 325.0000 mg | ORAL_TABLET | Freq: Four times a day (QID) | ORAL | Status: AC | PRN

## 2022-01-12 MED ORDER — ENSURE ENLIVE PO LIQD: 237.0000 mL | Freq: Two times a day (BID) | ORAL | 0 refills | Status: AC

## 2022-01-12 MED ORDER — FE FUMARATE-B12-VIT C-FA-IFC PO CAPS: 1.0000 | ORAL_CAPSULE | Freq: Two times a day (BID) | ORAL | 0 refills | Status: AC

## 2022-01-12 NOTE — TOC Progression Note (Signed)
Transition of Care (TOC) - Progression Note  ? ? ?Patient Details  ?Name: LAINE FONNER ?MRN: [id number] ?Date of Birth: Mar 12, 1929 ? ?Transition of Care (TOC) CM/SW Contact  ?Marlowe Sax, RN ?Phone Number: ?01/12/2022, 10:06 AM ? ?Clinical Narrative:    ? ? ?[facility] going to [facility] room 707 ? ?Her family is in the room and aware ?EMS called ? ?Expected Discharge Plan: Skilled Nursing Facility ?Barriers to Discharge: Continued Medical Work up ? ?Expected Discharge Plan and Services ?Expected Discharge Plan: Skilled Nursing Facility ?  ?Discharge Planning Services: CM Consult ?  ?Living arrangements for the past 2 months: Single Family Home ?Expected Discharge Date: 01/12/22               ?  ?  ?  ?  ?  ?  ?  ?  ?  ?  ? ? ?Social Determinants of Health (SDOH) Interventions ?  ? ?Readmission Risk Interventions ? ?  01/08/2022  ?  1:46 PM  ?Readmission Risk Prevention Plan  ?Transportation Screening Complete  ?PCP or Specialist Appt within 5-7 Days Complete  ?Home Care Screening Complete  ?Medication Review (RN CM) Complete  ? ? ?

## 2022-01-12 NOTE — Progress Notes (Signed)
Report called to [facility] nurse that is receiving the patient at [facility].  ?

## 2022-01-12 NOTE — Progress Notes (Signed)
Subjective: ?5 Days Post-Op Procedure(s) (LRB): ?INTRAMEDULLARY (IM) NAIL INTERTROCHANTRIC (Left) ?Patient reports pain as mild this morning.  ?Patient is well, and has had no acute complaints or problems ?Blood pressure stable, overall pain improving. ?Denies any CP, SOB, ABD pain. ?We will continue therapy today.   ?Current plan is for discharge to SNF when able. ? ?Objective: ?Vital signs in last 24 hours: ?Temp:  [98 ?F (36.7 ?C)-99.9 ?F (37.7 ?C)] 98 ?F (36.7 ?C) (05/16 [id number]) ?Pulse Rate:  [65-76] 69 (05/16 0318) ?Resp:  [14-16] 14 (05/16 0318) ?BP: (108-133)/(63-69) 133/69 (05/16 0318) ?SpO2:  [92 %-96 %] 94 % (05/16 0318) ? ?Intake/Output from previous day: ?05/15 0701 - 05/16 0700 ?In: 240 [P.O.:240] ?Out: 250 [Urine:250] ?Intake/Output this shift: ?No intake/output data recorded. ? ?Recent Labs  ?  01/10/22 ?[id number] 01/11/22 ?0455  ?HGB 7.2* 8.4*  ? ?Recent Labs  ?  01/10/22 ?[id number] 01/11/22 ?0455  ?WBC 10.8* 9.2  ?RBC 2.21* 2.59*  ?HCT 21.5* 25.2*  ?PLT 141* 155  ? ?Recent Labs  ?  01/10/22 ?[id number] 01/11/22 ?0455  ?NA 137 140  ?K 4.0 3.9  ?CL 97* 103  ?CO2 29 30  ?BUN 32* 29*  ?CREATININE 1.02* 0.94  ?GLUCOSE 113* 106*  ?CALCIUM 8.0* 7.9*  ? ?No results for input(s): LABPT, INR in the last 72 hours. ? ?EXAM ?General - Patient is Alert, Appropriate, and Oriented ?Extremity - Neurovascular intact ?Sensation intact distally ?Intact pulses distally ?Dorsiflexion/Plantar flexion intact ?No cellulitis present ?Compartment soft ?No active bleeding noted to the left leg.  No ecchymosis. ?Dressing - dressing C/D/I and scant drainage ?Motor Function - intact, moving foot and toes well on exam.  ?Negative Homans bilaterally. ? ?Past Medical History:  ?Diagnosis Date  ? 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 Osteoporosis   ? Seasonal allergies   ? Vertigo   ? rare  ? ? ?Assessment/Plan:   ?5 Days Post-Op Procedure(s) (LRB): ?INTRAMEDULLARY (IM) NAIL INTERTROCHANTRIC (Left) ?Principal Problem: ?  Closed hip fracture  requiring operative repair, left, sequela ?Active Problems: ?  Orthostatic hypotension ?  Sinus bradycardia ?  Femur fracture, left ([place]) ?  Stage 3b chronic kidney disease (CKD) ([place]) ?  Lactic acidosis ?  Adnexal mass ?  Acute blood loss anemia ? ?Estimated body mass index is 17.83 kg/m? as calculated from the following: ?  Height as of this encounter: 5\' 8"  (1.727 m). ?  Weight as of this encounter: 53.2 kg. ?Advance diet ?Up with therapy, WBAT ? ?Vital signs stable ? ?Hemoglobin pending this am. Continue with iron supplement.   ? ?Up with therapy today. ? ?Plan for discharge to SNF when able. ? ? ?Patient will need follow-up with [facility] orthopedics in 2 weeks ?TED hose bilateral lower extremity x6 weeks ? ? ?DVT Prophylaxis - TED hose and SCDs ?Weight-Bearing as tolerated to left leg ? ?T. Rachelle Hora, PA-C ?[place] ?01/12/2022, 7:41 AM ?  ?

## 2022-01-12 NOTE — Discharge Summary (Signed)
?Physician Discharge Summary ?  ?Patient: Cassie Solis MRN: [id number] DOB: 12/25/1928  ?Admit date:     01/06/2022  ?Discharge date: 01/12/22  ?Discharge Physician: Loletha Grayer  ? ?PCP: Pcp, No  ? ?Recommendations at discharge:  ? ?Follow-up team at rehab 1 day ?Follow-up orthopedic surgery 2 weeks ? ?Discharge Diagnoses: ?Principal Problem: ?  Closed hip fracture requiring operative repair, left, sequela ?Active Problems: ?  Acute blood loss anemia ?  Orthostatic hypotension ?  Sinus bradycardia ?  Femur fracture, left ([place]) ?  Stage 3b chronic kidney disease (CKD) ([place]) ?  Lactic acidosis ?  Adnexal mass ? ? ? ?Hospital Course: ?Patient was admitted to the hospital on 01/06/2022 and discharged on 01/12/2022.  Came in with a mechanical fall and left hip pain and found to have a left hip fracture.  The patient was anemic on presentation and hemoglobin dropped down to 7.6 prior to surgery with IV fluids.  Her baseline hemoglobin as outpatient was 11.3.  The patient was given 1 unit of blood prior to surgery.  The patient had a left intramedullary nail and her trochanteric procedure by Dr. Rudene Christians on 01/07/2022.  Postoperatively patient's hemoglobin went down to 6.9 and the patient was given another unit of packed red blood cells.  The patient was orthostatic on 01/10/2022 with a hemoglobin of 7.2 and she was transfused 3rd unit of packed red blood cells.  Hemoglobin stable at 8.9 upon discharge.  Holding off on Lovenox injections.  Patient will need TED hose for 6 weeks and SCDs if able to do them at the rehab.  Physical therapy as tolerated. ? ?Assessment and Plan: ?* Closed hip fracture requiring operative repair, left, sequela ?Patient had a mechanical fall and found to have a closed left intertrochanteric fracture of the left femur with slight lateral angulation.  Dr. Rudene Christians performed a intramedullary nail intertrochanteric procedure on 01/07/2022.  Pain control.  Physical therapy evaluation appreciated.  CT scan  showing bleeding in the left hip and buttock area.  Holding blood thinners at this time including DVT prophylaxis Lovenox injections.  Hemoglobin 8.9 upon discharge. ? ?Acute blood loss anemia ?Outpatient hemoglobin 11.3.  Patient transfused 3 units of packed red blood cells and IV iron during the hospital course.  Today's hemoglobin held at 8.9.  CT scan showing some bleeding around the left hip and buttock area.  This should reabsorb over time ? ?Orthostatic hypotension ?Patient feeling better with moving around today.  Received 3 units of packed red blood cells during the hospital course. ? ? ?Adnexal mass ?10.4 x 5.8 x 8.0 cm multicystic lesion left adnexal space.  The patient states that she is 88 years old and does not want any further work-up for this regardless of what it is. ? ?Lactic acidosis ?Urinalysis and chest x-ray negative.  Does not seem to be related to infection.  ? ?Stage 3b chronic kidney disease (CKD) ([place]) ?Creatinine 1.26 on presentation with IV fluids came down to 0.94.  ? ?Sinus bradycardia ?Not on any rate controlling medications. ? ? ? ? ?  ? ? ?Consultants: Orthopedic surgery ?Procedures performed: Left intertrochanteric medullary nail procedure by Dr. Rudene Christians ?Disposition: Skilled nursing facility ?Diet recommendation:  ?Regular diet ?DISCHARGE MEDICATION: ?Allergies as of 01/12/2022   ? ?   Reactions  ? Azithromycin Nausea Only  ? GI issues  ? ?  ? ?  ?Medication List  ?  ? ?STOP taking these medications   ? ?aspirin 81 MG tablet ?  ?  meclizine 25 MG tablet ?Commonly known as: ANTIVERT ?  ? ?  ? ?TAKE these medications   ? ?acetaminophen 325 MG tablet ?Commonly known as: TYLENOL ?Take 1-2 tablets (325-650 mg total) by mouth every 6 (six) hours as needed for mild pain or moderate pain (pain score 1-3 or temp > 100.5). ?  ?CITRACAL PO ?Take 1 tablet by mouth daily. AM ?  ?feeding supplement Liqd ?Take 237 mLs by mouth 2 (two) times daily between meals. ?  ?ferrous [id number] C-folic  acid capsule ?Commonly known as: TRINSICON / FOLTRIN ?Take 1 capsule by mouth 2 (two) times daily after a meal. ?  ?HYDROcodone-acetaminophen 5-325 MG tablet ?Commonly known as: NORCO/VICODIN ?Take 1 tablet by mouth every 6 (six) hours as needed for severe pain. ?  ?polyethylene glycol 17 g packet ?Commonly known as: MIRALAX / GLYCOLAX ?Take 17 g by mouth daily as needed for mild constipation. ?  ?PreserVision AREDS 2+Multi Vit Caps ?Take 1 capsule by mouth daily. ?  ? ?  ? ? Contact information for follow-up providers   ? ? Duanne Guess, PA-C Follow up in 2 week(s).   ?Specialties: Orthopedic Surgery, Emergency Medicine ?Contact information: ?[place] [address] ?[phone] ? ? ?  ?  ? ?  ?  ? ? Contact information for after-discharge care   ? ? Destination   ? ? [facility] .   ?Service: Skilled Nursing ?Contact information: ?[address] ?[place] ?[phone] ? ?  ?  ? ?  ?  ? ?  ?  ? ?  ? ?Discharge Exam: ?Filed Weights  ? 01/06/22 1733 01/07/22 0512  ?Weight: 55 kg 53.2 kg  ? ?Physical Exam ?HENT:  ?   Head: Normocephalic.  ?   Mouth/Throat:  ?   Pharynx: No oropharyngeal exudate.  ?Eyes:  ?   General: Lids are normal.  ?   Conjunctiva/sclera: Conjunctivae normal.  ?Cardiovascular:  ?   Rate and Rhythm: Normal rate and regular rhythm.  ?   Heart sounds: Normal heart sounds, S1 normal and S2 normal.  ?Pulmonary:  ?   Breath sounds: Normal breath sounds. No decreased breath sounds, wheezing, rhonchi or rales.  ?Abdominal:  ?   Palpations: Abdomen is soft.  ?   Tenderness: There is no abdominal tenderness.  ?Musculoskeletal:  ?   Right lower leg: No swelling.  ?   Left lower leg: No swelling.  ?Skin: ?   General: Skin is warm.  ?   Comments: Bruising on left forehead, bruises seen on arms.  ?Neurological:  ?   Mental Status: She is alert and oriented to person, place, and time.  ?   Comments: Able to flex and extend bilateral  ankles.  Unable to lift left leg up off the bed.  ?  ? ?Condition at discharge: stable ? ?The results of significant diagnostics from this hospitalization (including imaging, microbiology, ancillary and laboratory) are listed below for reference.  ? ?Imaging Studies: ?CT ABDOMEN PELVIS WO CONTRAST ? ?Addendum Date: 01/10/2022   ?ADDENDUM REPORT: 01/10/2022 11:31 ADDENDUM: Gas is present in the lumen of the urinary bladder. This suggests recent instrumentation. In the absence of recent bladder instrumentation, infection should be considered. Electronically Signed   By: Misty Stanley M.D.   On: 01/10/2022 11:31  ? ?Result Date: 01/10/2022 ?CLINICAL DATA:  Dropping hemoglobin.  Status post fall. EXAM: CT ABDOMEN AND PELVIS WITHOUT CONTRAST TECHNIQUE: Multidetector CT imaging of the  abdomen and pelvis was performed following the standard protocol without IV contrast. RADIATION DOSE REDUCTION: This exam was performed according to the departmental dose-optimization program which includes automated exposure control, adjustment of the mA and/or kV according to patient size and/or use of iterative reconstruction technique. COMPARISON:  None Available. FINDINGS: Lower chest: Unremarkable. Hepatobiliary: No suspicious focal abnormality in the liver on this study without intravenous contrast. Gallbladder is distended. No intrahepatic or extrahepatic biliary dilation. Pancreas: No focal mass lesion. No dilatation of the main duct. No intraparenchymal cyst. No peripancreatic edema. Spleen: No splenomegaly. No focal mass lesion. Adrenals/Urinary Tract: No adrenal nodule or mass. Kidneys unremarkable. No evidence for hydroureter. The urinary bladder appears normal for the degree of distention. Stomach/Bowel: Stomach is unremarkable. No gastric wall thickening. No evidence of outlet obstruction. Duodenum is normally positioned as is the ligament of Treitz. No small bowel wall thickening. No small bowel dilatation. The terminal ileum  is normal. The appendix is normal. No gross colonic mass. No colonic wall thickening. Vascular/Lymphatic: There is moderate atherosclerotic calcification of the abdominal aorta without aneurysm. There is n

## 2022-02-21 DIAGNOSIS — R55 Syncope and collapse: Secondary | ICD-10-CM

## 2022-02-21 HISTORY — DX: Syncope and collapse: R55

## 2022-02-22 ENCOUNTER — Other Ambulatory Visit: Payer: Self-pay | Admitting: Orthopedic Surgery

## 2022-02-22 DIAGNOSIS — S72142D Displaced intertrochanteric fracture of left femur, subsequent encounter for closed fracture with routine healing: Secondary | ICD-10-CM

## 2022-02-22 DIAGNOSIS — Z9889 Other specified postprocedural states: Secondary | ICD-10-CM

## 2022-02-24 ENCOUNTER — Ambulatory Visit
Admission: RE | Admit: 2022-02-24 | Discharge: 2022-02-24 | Disposition: A | Discharge status: Home / Self Care | Payer: PPO | Source: Ambulatory Visit | Attending: Orthopedic Surgery | Admitting: Orthopedic Surgery

## 2022-02-24 ENCOUNTER — Other Ambulatory Visit: Payer: Self-pay | Admitting: Orthopedic Surgery

## 2022-02-24 DIAGNOSIS — S72142D Displaced intertrochanteric fracture of left femur, subsequent encounter for closed fracture with routine healing: Secondary | ICD-10-CM | POA: Insufficient documentation

## 2022-02-24 DIAGNOSIS — Z9889 Other specified postprocedural states: Secondary | ICD-10-CM | POA: Insufficient documentation

## 2022-02-24 DIAGNOSIS — Z8781 Personal history of (healed) traumatic fracture: Secondary | ICD-10-CM

## 2022-02-25 ENCOUNTER — Encounter
Admission: RE | Admit: 2022-02-25 | Discharge: 2022-02-25 | Disposition: A | Discharge status: Home / Self Care | Payer: HMO | Source: Ambulatory Visit | Attending: Orthopedic Surgery | Admitting: Orthopedic Surgery

## 2022-02-25 HISTORY — DX: Acidosis, unspecified: E87.20

## 2022-02-25 HISTORY — DX: Other specified conditions associated with female genital organs and menstrual cycle: N94.89

## 2022-02-25 HISTORY — DX: Bradycardia, unspecified: R00.1

## 2022-02-25 HISTORY — DX: Orthostatic hypotension: I95.1

## 2022-02-25 HISTORY — DX: Chronic kidney disease, unspecified: N18.9

## 2022-02-25 HISTORY — DX: Anemia, unspecified: D64.9

## 2022-02-25 HISTORY — DX: Unspecified macular degeneration: H35.30

## 2022-02-25 NOTE — Patient Instructions (Signed)
Your procedure is scheduled on:02-26-22 Friday Report to the Registration Desk on the 1st floor of the [facility]. To find out your arrival time, please call [phone] between 1PM - 3PM on:02-25-22 Thursday If your arrival time is 6:00 am, do not arrive prior to that time as the [facility] entrance doors do not open until 6:00 am.  REMEMBER: Instructions that are not followed completely may result in serious medical risk, up to and including death; or upon the discretion of your surgeon and anesthesiologist your surgery may need to be rescheduled.  Do not eat food after midnight the night before surgery.  No gum chewing, lozengers or hard candies.  You may however, drink CLEAR liquids up to 2 hours before you are scheduled to arrive for your surgery. Do not drink anything within 2 hours of your scheduled arrival time.  Clear liquids include: - water  - apple juice without pulp - gatorade (not RED colors) - black coffee or tea (Do NOT add milk or creamers to the coffee or tea) Do NOT drink anything that is not on this list.  TAKE THESE MEDICATIONS THE MORNING OF SURGERY WITH A SIP OF WATER: -You may take Tylenol if needed for pain   One week prior to surgery: Stop Anti-inflammatories (NSAIDS) such as Advil, Aleve, Ibuprofen, Motrin, Naproxen, Naprosyn and Aspirin based products such as Excedrin, Goodys Powder, BC Powder You may however, continue to take Tylenol if needed for pain up until the day of surgery.  No Alcohol for 24 hours before or after surgery.  No Smoking including e-cigarettes for 24 hours prior to surgery.  No chewable tobacco products for at least 6 hours prior to surgery.  No nicotine patches on the day of surgery.  Do not use any "recreational" drugs for at least a week prior to your surgery.  Please be advised that the combination of cocaine and anesthesia may have negative outcomes, up to and including death. If you test positive for cocaine, your surgery  will be cancelled.  On the morning of surgery brush your teeth with toothpaste and water, you may rinse your mouth with mouthwash if you wish. Do not swallow any toothpaste or mouthwash.  Do not wear jewelry, make-up, hairpins, clips or nail polish.  Do not wear lotions, powders, or perfumes.   Do not shave body from the neck down 48 hours prior to surgery just in case you cut yourself which could leave a site for infection.  Also, freshly shaved skin may become irritated if using the CHG soap.  Contact lenses, hearing aids and dentures may not be worn into surgery.  Do not bring valuables to the hospital. [facility] is not responsible for any missing/lost belongings or valuables.   Notify your doctor if there is any change in your medical condition (cold, fever, infection).  Wear comfortable clothing (specific to your surgery type) to the hospital.  After surgery, you can help prevent lung complications by doing breathing exercises.  Take deep breaths and cough every 1-2 hours. Your doctor may order a device called an Incentive Spirometer to help you take deep breaths. When coughing or sneezing, hold a pillow firmly against your incision with both hands. This is called "splinting." Doing this helps protect your incision. It also decreases belly discomfort.  If you are being admitted to the hospital overnight, leave your suitcase in the car. After surgery it may be brought to your room.  If you are being discharged the day of  surgery, you will not be allowed to drive home. You will need a responsible adult (18 years or older) to drive you home and stay with you that night.   If you are taking public transportation, you will need to have a responsible adult (18 years or older) with you. Please confirm with your physician that it is acceptable to use public transportation.   Please call the [facility] at [phone] if you have any questions about these  instructions.  Surgery Visitation Policy:  Patients undergoing a surgery or procedure may have two family members or support persons with them as long as the person is not COVID-19 positive or experiencing its symptoms.   Inpatient Visitation:    Visiting hours are 7 a.m. to 8 p.m. Up to four visitors are allowed at one time in a patient room, including children. The visitors may rotate out with other people during the day. One designated support person (adult) may remain overnight.

## 2022-02-26 ENCOUNTER — Ambulatory Visit: Payer: PPO

## 2022-02-26 ENCOUNTER — Ambulatory Visit: Payer: PPO | Admitting: Certified Registered Nurse Anesthetist

## 2022-02-26 ENCOUNTER — Encounter: Payer: Self-pay | Admitting: Orthopedic Surgery

## 2022-02-26 ENCOUNTER — Other Ambulatory Visit: Payer: Self-pay

## 2022-02-26 ENCOUNTER — Inpatient Hospital Stay
Admission: AD | Admit: 2022-02-26 | Discharge: 2022-03-04 | DRG: 481 | Disposition: A | Discharge status: Home Health | Payer: PPO | Attending: Orthopedic Surgery | Admitting: Orthopedic Surgery

## 2022-02-26 ENCOUNTER — Encounter
Admission: AD | Disposition: A | Discharge status: Home / Self Care | Payer: Self-pay | Source: Home / Self Care | Attending: Orthopedic Surgery

## 2022-02-26 DIAGNOSIS — R636 Underweight: Secondary | ICD-10-CM | POA: Diagnosis present

## 2022-02-26 DIAGNOSIS — Y831 Surgical operation with implant of artificial internal device as the cause of abnormal reaction of the patient, or of later complication, without mention of misadventure at the time of the procedure: Secondary | ICD-10-CM | POA: Diagnosis present

## 2022-02-26 DIAGNOSIS — M810 Age-related osteoporosis without current pathological fracture: Secondary | ICD-10-CM | POA: Diagnosis present

## 2022-02-26 DIAGNOSIS — Z8781 Personal history of (healed) traumatic fracture: Principal | ICD-10-CM

## 2022-02-26 DIAGNOSIS — T84125A Displacement of internal fixation device of left femur, initial encounter: Secondary | ICD-10-CM | POA: Diagnosis present

## 2022-02-26 DIAGNOSIS — Z681 Body mass index (BMI) 19 or less, adult: Secondary | ICD-10-CM | POA: Diagnosis not present

## 2022-02-26 DIAGNOSIS — R609 Edema, unspecified: Secondary | ICD-10-CM

## 2022-02-26 DIAGNOSIS — D62 Acute posthemorrhagic anemia: Secondary | ICD-10-CM | POA: Diagnosis not present

## 2022-02-26 DIAGNOSIS — Z8249 Family history of ischemic heart disease and other diseases of the circulatory system: Secondary | ICD-10-CM | POA: Diagnosis not present

## 2022-02-26 DIAGNOSIS — Z9889 Other specified postprocedural states: Secondary | ICD-10-CM | POA: Diagnosis present

## 2022-02-26 HISTORY — PX: FEMUR IM NAIL: SHX1597

## 2022-02-26 LAB — CBC
Hemoglobin: 10.1 g/dL — ABNORMAL LOW (ref 12.0–15.0)
RDW: 15.9 % — ABNORMAL HIGH (ref 11.5–15.5)
WBC: 8.6 10*3/uL (ref 4.0–10.5)

## 2022-02-26 SURGERY — INSERTION, INTRAMEDULLARY ROD, FEMUR
Anesthesia: Spinal | Site: Hip | Laterality: Left

## 2022-02-26 MED ORDER — CEFAZOLIN SODIUM-DEXTROSE 1-4 GM/50ML-% IV SOLN
1.0000 g | Freq: Four times a day (QID) | INTRAVENOUS | Status: DC
  Administered 2022-02-26: 1 g via INTRAVENOUS
  Filled 2022-02-26 (×3): qty 50

## 2022-02-26 MED ORDER — MORPHINE SULFATE (PF) 2 MG/ML IV SOLN: 0.5000 mg | INTRAVENOUS | Status: DC | PRN

## 2022-02-26 MED ORDER — ZOLPIDEM TARTRATE 5 MG PO TABS: 5.0000 mg | ORAL_TABLET | Freq: Every evening | ORAL | Status: DC | PRN

## 2022-02-26 MED ORDER — ACETAMINOPHEN 325 MG PO TABS: 650.0000 mg | ORAL_TABLET | Freq: Once | ORAL | Status: DC | PRN

## 2022-02-26 MED ORDER — CHLORHEXIDINE GLUCONATE 0.12 % MT SOLN
OROMUCOSAL | Status: AC
  Administered 2022-02-26: 15 mL via OROMUCOSAL
  Filled 2022-02-26: qty 15

## 2022-02-26 MED ORDER — FE FUMARATE-B12-VIT C-FA-IFC PO CAPS
1.0000 | ORAL_CAPSULE | Freq: Two times a day (BID) | ORAL | Status: DC
  Administered 2022-02-26 – 2022-03-04 (×12): 1 via ORAL
  Filled 2022-02-26 (×13): qty 1

## 2022-02-26 MED ORDER — FAMOTIDINE 20 MG PO TABS
ORAL_TABLET | ORAL | Status: AC
  Filled 2022-02-26: qty 1

## 2022-02-26 MED ORDER — NEOMYCIN-POLYMYXIN B GU 40-200000 IR SOLN
Status: DC | PRN
  Administered 2022-02-26: 2 mL

## 2022-02-26 MED ORDER — CHLORHEXIDINE GLUCONATE 0.12 % MT SOLN: 15.0000 mL | Freq: Once | OROMUCOSAL | Status: AC

## 2022-02-26 MED ORDER — PHENYLEPHRINE 80 MCG/ML (10ML) SYRINGE FOR IV PUSH (FOR BLOOD PRESSURE SUPPORT)
PREFILLED_SYRINGE | INTRAVENOUS | Status: DC | PRN
  Administered 2022-02-26: 80 ug via INTRAVENOUS

## 2022-02-26 MED ORDER — ONDANSETRON HCL 4 MG PO TABS: 4.0000 mg | ORAL_TABLET | Freq: Four times a day (QID) | ORAL | Status: DC | PRN

## 2022-02-26 MED ORDER — LIDOCAINE HCL (CARDIAC) PF 100 MG/5ML IV SOSY
PREFILLED_SYRINGE | INTRAVENOUS | Status: DC | PRN
  Administered 2022-02-26: 50 mg via INTRAVENOUS

## 2022-02-26 MED ORDER — POLYETHYLENE GLYCOL 3350 17 G PO PACK
17.0000 g | PACK | Freq: Every day | ORAL | Status: DC | PRN
  Administered 2022-03-02 – 2022-03-04 (×3): 17 g via ORAL
  Filled 2022-02-26 (×3): qty 1

## 2022-02-26 MED ORDER — NEOMYCIN-POLYMYXIN B GU 40-200000 IR SOLN
Status: AC
  Filled 2022-02-26: qty 20

## 2022-02-26 MED ORDER — FLEET ENEMA 7-19 GM/118ML RE ENEM: 1.0000 | ENEMA | Freq: Once | RECTAL | Status: DC | PRN

## 2022-02-26 MED ORDER — METHOCARBAMOL 500 MG PO TABS
500.0000 mg | ORAL_TABLET | Freq: Four times a day (QID) | ORAL | Status: DC | PRN
  Administered 2022-02-26 – 2022-03-04 (×11): 500 mg via ORAL
  Filled 2022-02-26 (×11): qty 1

## 2022-02-26 MED ORDER — SENNOSIDES-DOCUSATE SODIUM 8.6-50 MG PO TABS
1.0000 | ORAL_TABLET | Freq: Every evening | ORAL | Status: DC | PRN
  Administered 2022-02-28 – 2022-03-03 (×4): 1 via ORAL
  Filled 2022-02-26 (×4): qty 1

## 2022-02-26 MED ORDER — CEFAZOLIN SODIUM-DEXTROSE 2-4 GM/100ML-% IV SOLN
2.0000 g | INTRAVENOUS | Status: AC
  Administered 2022-02-26: 2 g via INTRAVENOUS

## 2022-02-26 MED ORDER — HYDROCODONE-ACETAMINOPHEN 7.5-325 MG PO TABS
1.0000 | ORAL_TABLET | ORAL | Status: DC | PRN
  Administered 2022-02-27 – 2022-02-28 (×4): 1 via ORAL
  Administered 2022-03-01 – 2022-03-03 (×2): 2 via ORAL
  Administered 2022-03-03: 1 via ORAL
  Filled 2022-02-26 (×4): qty 1
  Filled 2022-02-26 (×3): qty 2
  Filled 2022-02-26: qty 1
  Filled 2022-02-26: qty 2

## 2022-02-26 MED ORDER — TRAMADOL HCL 50 MG PO TABS: 25.0000 mg | ORAL_TABLET | Freq: Once | ORAL | Status: DC | PRN

## 2022-02-26 MED ORDER — METOCLOPRAMIDE HCL 5 MG/ML IJ SOLN: 5.0000 mg | Freq: Three times a day (TID) | INTRAMUSCULAR | Status: DC | PRN

## 2022-02-26 MED ORDER — DIPHENHYDRAMINE HCL 12.5 MG/5ML PO ELIX: 12.5000 mg | ORAL_SOLUTION | ORAL | Status: DC | PRN

## 2022-02-26 MED ORDER — ACETAMINOPHEN 10 MG/ML IV SOLN
INTRAVENOUS | Status: AC
  Filled 2022-02-26: qty 100

## 2022-02-26 MED ORDER — FAMOTIDINE 20 MG PO TABS
20.0000 mg | ORAL_TABLET | Freq: Once | ORAL | Status: AC
  Administered 2022-02-26: 20 mg via ORAL

## 2022-02-26 MED ORDER — ENSURE ENLIVE PO LIQD
237.0000 mL | Freq: Two times a day (BID) | ORAL | Status: DC
  Administered 2022-02-27 – 2022-03-04 (×10): 237 mL via ORAL

## 2022-02-26 MED ORDER — 0.9 % SODIUM CHLORIDE (POUR BTL) OPTIME
TOPICAL | Status: DC | PRN
  Administered 2022-02-26: 500 mL

## 2022-02-26 MED ORDER — SODIUM CHLORIDE 0.9 % IV SOLN
INTRAVENOUS | Status: DC
  Administered 2022-02-27: 500 mL via INTRAVENOUS

## 2022-02-26 MED ORDER — ORAL CARE MOUTH RINSE: 15.0000 mL | Freq: Once | OROMUCOSAL | Status: AC

## 2022-02-26 MED ORDER — DOCUSATE SODIUM 100 MG PO CAPS
100.0000 mg | ORAL_CAPSULE | Freq: Two times a day (BID) | ORAL | Status: DC
Start: 2022-02-26 — End: 2022-03-04
  Administered 2022-02-26 – 2022-03-04 (×11): 100 mg via ORAL
  Filled 2022-02-26 (×12): qty 1

## 2022-02-26 MED ORDER — OCUVITE-LUTEIN PO CAPS
1.0000 | ORAL_CAPSULE | Freq: Every day | ORAL | Status: DC
  Administered 2022-02-26 – 2022-03-04 (×7): 1 via ORAL
  Filled 2022-02-26 (×7): qty 1

## 2022-02-26 MED ORDER — HYDROCODONE-ACETAMINOPHEN 5-325 MG PO TABS
1.0000 | ORAL_TABLET | ORAL | Status: DC | PRN
  Administered 2022-02-26 – 2022-03-02 (×6): 1 via ORAL
  Filled 2022-02-26 (×3): qty 1
  Filled 2022-02-26: qty 2
  Filled 2022-02-26 (×3): qty 1

## 2022-02-26 MED ORDER — BUPIVACAINE HCL (PF) 0.5 % IJ SOLN
INTRAMUSCULAR | Status: DC | PRN
  Administered 2022-02-26: 2.2 mL

## 2022-02-26 MED ORDER — ONDANSETRON HCL 4 MG/2ML IJ SOLN: 4.0000 mg | Freq: Four times a day (QID) | INTRAMUSCULAR | Status: DC | PRN

## 2022-02-26 MED ORDER — LACTATED RINGERS IV SOLN: INTRAVENOUS | Status: DC

## 2022-02-26 MED ORDER — PROPOFOL 500 MG/50ML IV EMUL
INTRAVENOUS | Status: DC | PRN
  Administered 2022-02-26: 50 ug/kg/min via INTRAVENOUS

## 2022-02-26 MED ORDER — ENOXAPARIN SODIUM 40 MG/0.4ML IJ SOSY
40.0000 mg | PREFILLED_SYRINGE | INTRAMUSCULAR | Status: DC
  Administered 2022-02-27 – 2022-02-28 (×2): 40 mg via SUBCUTANEOUS
  Filled 2022-02-26 (×2): qty 0.4

## 2022-02-26 MED ORDER — METHOCARBAMOL 1000 MG/10ML IJ SOLN: 500.0000 mg | Freq: Four times a day (QID) | INTRAVENOUS | Status: DC | PRN

## 2022-02-26 MED ORDER — PROPOFOL 10 MG/ML IV BOLUS
INTRAVENOUS | Status: AC
  Filled 2022-02-26: qty 20

## 2022-02-26 MED ORDER — CEFAZOLIN SODIUM-DEXTROSE 2-4 GM/100ML-% IV SOLN
INTRAVENOUS | Status: AC
  Filled 2022-02-26: qty 100

## 2022-02-26 MED ORDER — ACETAMINOPHEN 325 MG PO TABS
325.0000 mg | ORAL_TABLET | Freq: Four times a day (QID) | ORAL | Status: DC | PRN
  Filled 2022-02-26: qty 2

## 2022-02-26 MED ORDER — ACETAMINOPHEN 10 MG/ML IV SOLN
INTRAVENOUS | Status: DC | PRN
  Administered 2022-02-26: 1000 mg via INTRAVENOUS

## 2022-02-26 MED ORDER — BISACODYL 5 MG PO TBEC
5.0000 mg | DELAYED_RELEASE_TABLET | Freq: Every day | ORAL | Status: DC | PRN
  Administered 2022-02-28 – 2022-03-04 (×4): 5 mg via ORAL
  Filled 2022-02-26 (×4): qty 1

## 2022-02-26 MED ORDER — METOCLOPRAMIDE HCL 5 MG PO TABS: 5.0000 mg | ORAL_TABLET | Freq: Three times a day (TID) | ORAL | Status: DC | PRN

## 2022-02-26 MED ORDER — FENTANYL CITRATE (PF) 100 MCG/2ML IJ SOLN
INTRAMUSCULAR | Status: AC
  Filled 2022-02-26: qty 2

## 2022-02-26 SURGICAL SUPPLY — 47 items
BIT DRILL CROWE POINT TWST 4.3 (DRILL) IMPLANT
BLADE SAGITTAL AGGR TOOTH XLG (BLADE) ×1 IMPLANT
BNDG COHESIVE 4X5 TAN ST LF (GAUZE/BANDAGES/DRESSINGS) ×4 IMPLANT
CHLORAPREP W/TINT 26 (MISCELLANEOUS) ×2 IMPLANT
CORTICAL BONE SCR 5.0MM X 46MM (Screw) ×2 IMPLANT
DRAPE 3/4 80X56 (DRAPES) ×2 IMPLANT
DRAPE U-SHAPE 47X51 STRL (DRAPES) ×2 IMPLANT
DRILL CROWE POINT TWIST 4.3 (DRILL) ×2
DRSG MEPITEL 4X7.2 (GAUZE/BANDAGES/DRESSINGS) ×1 IMPLANT
DRSG OPSITE POSTOP 3X4 (GAUZE/BANDAGES/DRESSINGS) ×2 IMPLANT
DRSG OPSITE POSTOP 4X6 (GAUZE/BANDAGES/DRESSINGS) ×2 IMPLANT
FEM CORT STRUT 15X100MM  HU (Miscellaneous) ×2 IMPLANT
FEM CORT STRUT 15X100MM HU (Miscellaneous) ×1 IMPLANT
FEMORAL CORT STRUT 15X100MM HU (Miscellaneous) ×1 IMPLANT
GAUZE SPONGE 4X4 12PLY STRL (GAUZE/BANDAGES/DRESSINGS) ×1 IMPLANT
GLOVE BIOGEL PI IND STRL 9 (GLOVE) ×1 IMPLANT
GLOVE BIOGEL PI INDICATOR 9 (GLOVE) ×2
GLOVE SURG SYN 9.0  PF PI (GLOVE) ×2
GLOVE SURG SYN 9.0 PF PI (GLOVE) ×1 IMPLANT
GOWN SRG 2XL LVL 4 RGLN SLV (GOWNS) ×1 IMPLANT
GOWN STRL NON-REIN 2XL LVL4 (GOWNS) ×2
GOWN STRL REUS W/ TWL LRG LVL3 (GOWN DISPOSABLE) ×1 IMPLANT
GOWN STRL REUS W/TWL LRG LVL3 (GOWN DISPOSABLE) ×2
GRAFT BNE STRUT CORT FEM 15X10 (Miscellaneous) IMPLANT
GUIDEPIN VERSANAIL DSP 3.2X444 (ORTHOPEDIC DISPOSABLE SUPPLIES) ×1 IMPLANT
GUIDEWIRE BALL NOSE 100CM (WIRE) ×1 IMPLANT
KIT PREVENA INCISION MGT 13 (CANNISTER) ×1 IMPLANT
KIT TURNOVER KIT A (KITS) ×2 IMPLANT
MANIFOLD NEPTUNE II (INSTRUMENTS) ×1 IMPLANT
MAT ABSORB  FLUID 56X50 GRAY (MISCELLANEOUS) ×2
MAT ABSORB FLUID 56X50 GRAY (MISCELLANEOUS) ×1 IMPLANT
NAIL IM AFFIXUS 9X320 125D LT (Nail) ×1 IMPLANT
NDL FILTER BLUNT 18X1 1/2 (NEEDLE) ×1 IMPLANT
NEEDLE FILTER BLUNT 18X 1/2SAF (NEEDLE) ×2
NEEDLE FILTER BLUNT 18X1 1/2 (NEEDLE) ×1 IMPLANT
NS IRRIG 500ML POUR BTL (IV SOLUTION) ×2 IMPLANT
PACK HIP COMPR (MISCELLANEOUS) ×2 IMPLANT
PAD ABD DERMACEA PRESS 5X9 (GAUZE/BANDAGES/DRESSINGS) ×2 IMPLANT
SCALPEL PROTECTED #15 DISP (BLADE) ×4 IMPLANT
SCREW CORTICL BON 5.0MM X 46MM (Screw) IMPLANT
SCREW LAG 10.5MMX105MM HFN (Screw) ×1 IMPLANT
STAPLER SKIN PROX 35W (STAPLE) ×2 IMPLANT
SUT VIC AB 1 CT1 36 (SUTURE) ×2 IMPLANT
SUT VIC AB 2-0 CT1 (SUTURE) ×2 IMPLANT
SYR 10ML LL (SYRINGE) ×2 IMPLANT
SYR BULB IRRIG 60ML STRL (SYRINGE) ×2 IMPLANT
WATER STERILE IRR 500ML POUR (IV SOLUTION) ×1 IMPLANT

## 2022-02-26 NOTE — H&P (Signed)
Chief Complaint  Patient presents with   Left Hip - Post Operative Visit    History of the Present Illness: Cassie Solis is a 88 y.o. female here today for follow-up evaluation status post left femoral rodding on 01/07/2022 for an intertrochanteric fracture.  The patient states she was doing well until Sunday morning, 02/21/2022, when she sat down, got up, and grabbed her walker, she passed out and fell. She has been in pain since that time. She locates her pain to the left hip that radiates down her left leg. She also has pain in her left groin, but it is not as severe as it was prior to surgery. She is accompanied by a female companion, and the companion states yesterday morning after the injury, it was bothering her, and after she sat for a while, it got sore and this morning it is even more sore. Prior to the fall, she was ambulating well with her walker. The therapist had her ambulated all the way up down the hall. The patient's companion states over the last couple of days, she has been weak, whereas she did have energy before. Her energy level has gone down, and her hemoglobin was low the whole time she was in the hospital and in rehab. They have not rechecked it. They are supposed to see her primary care physician, Dr. Nathanial Solis in [place], [place] on Wednesday, 02/24/2022, so they need to go ahead and get it checked now. She still has the swelling, but it has significantly improved and is now decreasing.  The patient lives in [place], [place].  I have reviewed past medical, surgical, social and family history, and allergies as documented in the EMR.  Past Medical History: Past Medical History:  Diagnosis Date   Diverticulitis   Past Surgical History: Past Surgical History:  Procedure Laterality Date   HYSTERECTOMY   Past Family History: Family History  Problem Relation Age of Onset   Heart disease Mother   Heart disease Father   Cancer Brother   Medications: Current Outpatient  Medications Ordered in Epic  Medication Sig Dispense Refill   acetaminophen (TYLENOL) 325 MG tablet Take 1-2 tablets by mouth every 6 (six) hours as needed   antiox #8/om3/dha/epa/lut/zeax (PRESERVISION AREDS 2, OMEGA-3, ORAL) Take by mouth   calcium citrate-vitamin D3 (CITRACAL+D) 315 mg-5 mcg (200 unit) tablet Take 2 tablets by mouth 2 (two) times daily with meals   HYDROcodone-acetaminophen (NORCO) 5-325 mg tablet Take one tablet at night for pain; may take up to every 6 hours as needed for pain if not working or driving 20 tablet 0   multivitamin with iron-B12-Vitamin C-Folic Acid (FOLTRIN) 110-0.5 mg capsule Take 1 capsule by mouth 2 (two) times daily   mv-min/FA/vit K/lutein/zeaxant (PRESERVISION AREDS 2 PLUS MV ORAL) Take by mouth   sennosides (SENOKOT) 8.6 mg tablet Take 1 tablet by mouth 2 (two) times daily   magnesium hydroxide (MILK OF MAGNESIA CONCENTRATED ORAL) Take by mouth (Patient not taking: Reported on 02/22/2022)   polyethylene glycol (MIRALAX) powder Take 17 g by mouth as needed (Patient not taking: Reported on 02/22/2022)   No current Epic-ordered facility-administered medications on file.   Allergies: Allergies  Allergen Reactions   Azithromycin Nausea  GI issues    Body mass index is 18.6 kg/m.  Review of Systems: A comprehensive 14 point ROS was performed, reviewed, and the pertinent orthopaedic findings are documented in the HPI.  Vitals:  02/22/22 1017  BP: 120/76    General Physical  Examination:  General/Constitutional: No apparent distress: well-nourished and well developed. Eyes: Pupils equal, round with synchronous movement. Lungs: Clear to auscultation HEENT: Normal Vascular: No edema, swelling or tenderness, except as noted in detailed exam. Cardiac: Heart rate and rhythm is regular. Integumentary: No impressive skin lesions present, except as noted in detailed exam. Neuro/Psych: Normal mood and affect, oriented to person, place and  time.  Musculoskeletal Examination: On exam, she has pain with rotation. Left hip has approximately 30 degrees of internal and external rotation. Active shortening to the left lower extremity.  Radiographs: AP and lateral x-rays of the left femur were ordered and personally reviewed today. These show screw migration, cutting out of the head into the acetabulum. There is extensive callus.  X-ray Impression Screw migration cutting into the acetabulum. There is some evidence of healing, however.  Assessment: ICD-10-CM  1. S/P ORIF (open reduction internal fixation) fracture Z98.890  Z87.81  2. Closed 2-part intertrochanteric fracture of left femur with routine healing, subsequent encounter S72.142D   Plan: The patient has clinical findings of stable left femur status post rodding.  We discussed the patient's x-ray findings. I explained it is healing, but the screw has shifted. We will need to get a CAT scan to see if she is healed. If she is mostly healed, we can just take the screw out and put a short screw, and if she is not healed at all, then we are looking at a much bigger surgery. We will check her blood count today. I advised her not to ambulate much and to hold on therapy until we get the CAT scan.  The patient will follow up as needed.  Document Attestation: Floria Raveling, have reviewed and updated documentation for [facility], MD, utilizing Nuance DAX.   Electronically signed by Marlena Clipper, MD at 02/22/2022 8:24 PM EDT  CT showed partial healing.  We will plan on exchange rodding with bone grafting in the defect where the current leg screw is.  We will use 125 degree rod to allow for different area of the femoral head for the leg screw to be placed. Reviewed  H+P. No changes noted.

## 2022-02-26 NOTE — Op Note (Signed)
02/26/2022  3:27 PM  PATIENT:  Cassie Solis  88 y.o. female  PRE-OPERATIVE DIAGNOSIS:  Status post ORIF fracture  Z98.890 Closed 2-part intertorchanteric fracture of left femur with routine healing, subsequent encounter  S72.142D Loss of fixation  POST-OPERATIVE DIAGNOSIS:  stable left femur status post rodding.  PROCEDURE:  Procedure(s): Left hip affixus removal with repeat fixation with Affixus (Left)  SURGEON: Leitha Schuller, MD  ASSISTANTS: None  ANESTHESIA:   spinal  EBL:  Total I/O In: 200 [I.V.:200] Out: 100 [Blood:100]  BLOOD ADMINISTERED:none  DRAINS: none   LOCAL MEDICATIONS USED:  NONE  SPECIMEN:  No Specimen  DISPOSITION OF SPECIMEN:  N/A  COUNTS:  YES  TOURNIQUET:  * No tourniquets in log *  IMPLANTS: Biomet affixes 9 x 320 rod 125 degrees with 105 mm leg screw and 46 mm distal interlocking screw  DICTATION: .Dragon Dictation patient was brought to the operating room and after adequate spinal anesthesia was obtained she was placed on the fracture table with the right leg in the well-leg holder left foot in the traction boot without traction applied.  C arm was brought in and good visualization of the hardware was noted in both AP and lateral projections.  The hip was then prepped and draped in the usual sterile fashion and appropriate patient identification timeout procedures were completed.  The prior proximal skin incision was opened and soft tissue spread to allow for loosening of the leg screw setting set bolt.  A lateral incision was then made and the prior leg screw was removed over guidewire without difficulty.  The distal interlocking screw was then removed and the tapered bolt attached to the rod for removal.  A 5 cm long piece of cortical bone strut was advanced into the area where the prior screw had been placed to help restore the bony defect and try to prevent cut out of the new screw.  The 125 degree rod was then inserted to the appropriate depth and  a guidewire inserted center center in the head measured and drilled followed by placement of the 105 mm leg screw setting the proximal bolt to allow for further compression.  Going distally it so happened that the distal slotted hole was in line with the prior screw and a 46 mm screw was placed getting fixation on both sides of cortices the wounds were all then thoroughly irrigated with #1 Vicryl to close the deep fascia 2-0 Vicryl subcutaneously and skin staples Xeroform 4 x 4's ABD and foam tape at the proximal incisions and honeycomb dressing over the distal incision.  PLAN OF CARE: Admit to inpatient   PATIENT DISPOSITION:  PACU - hemodynamically stable.

## 2022-02-26 NOTE — Anesthesia Preprocedure Evaluation (Addendum)
Anesthesia Evaluation  Patient identified by MRN, date of birth, ID band Patient awake    Reviewed: Allergy & Precautions, NPO status , Patient's Chart, lab work & pertinent test results  History of Anesthesia Complications Negative for: history of anesthetic complications  Airway Mallampati: III  TM Distance: >3 FB Neck ROM: Full    Dental  (+) Chipped, Missing,    Pulmonary neg pulmonary ROS, neg sleep apnea, neg COPD, Patient abstained from smoking.Not current smoker,    Pulmonary exam normal breath sounds clear to auscultation       Cardiovascular Exercise Tolerance: Good METS(-) hypertension(-) CAD and (-) Past MI negative cardio ROS  (-) dysrhythmias  Rhythm:Regular Rate:Normal - Systolic murmurs    Neuro/Psych negative neurological ROS  negative psych ROS   GI/Hepatic neg GERD  ,(+)     (-) substance abuse  ,   Endo/Other  neg diabetes  Renal/GU CRFRenal disease     Musculoskeletal  (+) Arthritis ,   Abdominal Normal abdominal exam  (+)   Peds  Hematology  (+) Blood dyscrasia, anemia ,   Anesthesia Other Findings Status post ORIF fracture  -  Closed 2-part intertorchanteric fracture of left femur with routine healing, subsequent encounter   Past Medical History: No date: Arthritis     Comment:  left wrist, bilateral hips No date: Osteoporosis No date: Seasonal allergies No date: Vertigo     Comment:  rare  Reproductive/Obstetrics                            Anesthesia Physical  Anesthesia Plan  ASA: 2  Anesthesia Plan: Spinal   Post-op Pain Management: Ofirmev IV (intra-op)*   Induction: Intravenous  PONV Risk Score and Plan: 2 and Ondansetron, Dexamethasone, Propofol infusion, TIVA and Treatment may vary due to age or medical condition  Airway Management Planned: Natural Airway  Additional Equipment: None  Intra-op Plan:   Post-operative Plan:    Informed Consent: I have reviewed the patients History and Physical, chart, labs and discussed the procedure including the risks, benefits and alternatives for the proposed anesthesia with the patient or authorized representative who has indicated his/her understanding and acceptance.       Plan Discussed with: CRNA and Surgeon  Anesthesia Plan Comments: (Discussed R/B/A of neuraxial anesthesia technique with patient and son at bedside: - rare risks of spinal/epidural hematoma, nerve damage, infection - Risk of PDPH - Risk of nausea and vomiting - Risk of conversion to general anesthesia and its associated risks, including sore throat, damage to lips/eyes/teeth/oropharynx, and rare risks such as cardiac and respiratory events. - Risk of allergic reactions - post operative cognitive dysfunction   Discussed the role of CRNA in patient's perioperative care.  Patient and son voiced understanding.)        Anesthesia Quick Evaluation

## 2022-02-26 NOTE — Transfer of Care (Signed)
Immediate Anesthesia Transfer of Care Note  Patient: Cassie Solis  Procedure(s) Performed: Left hip affixus removal with repeat fixation with Affixus (Left: Hip)  Patient Location: PACU  Anesthesia Type:Spinal  Level of Consciousness: awake and alert   Airway & Oxygen Therapy: Patient Spontanous Breathing and Patient connected to face mask oxygen  Post-op Assessment: Report given to RN and Post -op Vital signs reviewed and stable  Post vital signs: Reviewed and stable  Last Vitals:  Vitals Value Taken Time  BP 128/79 02/26/22 1516  Temp    Pulse 38 02/26/22 1520  Resp 15 02/26/22 1520  SpO2 95 % 02/26/22 1520  Vitals shown include unvalidated device data.  Last Pain:  Vitals:   02/26/22 1218  TempSrc: Tympanic  PainSc: 0-No pain         Complications: No notable events documented.

## 2022-02-26 NOTE — Anesthesia Procedure Notes (Signed)
Spinal  Patient location during procedure: OR Start time: 02/26/2022 1:38 PM End time: 02/26/2022 1:41 PM Reason for block: surgical anesthesia Staffing Performed: resident/CRNA  Anesthesiologist: Foye Deer, MD Resident/CRNA: Gilford Raid, CRNA Performed by: Gilford Raid, CRNA Authorized by: Foye Deer, MD   Preanesthetic Checklist Completed: patient identified, IV checked, site marked, risks and benefits discussed, surgical consent, monitors and equipment checked, pre-op evaluation and timeout performed Spinal Block Patient position: sitting Prep: ChloraPrep, DuraPrep and site prepped and draped Patient monitoring: heart rate, continuous pulse ox and blood pressure Approach: midline Location: L3-4 Injection technique: single-shot Needle Needle type: Sprotte  Needle gauge: 24 G Needle length: 9 cm Assessment Sensory level: T8 Events: CSF return Additional Notes Negative heme

## 2022-02-26 NOTE — Progress Notes (Signed)
Patient honeycomb dressing saturated with blood.  New honeycomb dressing applied, abd pads x2 as well as ace wrap.

## 2022-02-27 LAB — BASIC METABOLIC PANEL
Anion gap: 7 (ref 5–15)
BUN: 28 mg/dL — ABNORMAL HIGH (ref 8–23)
CO2: 25 mmol/L (ref 22–32)
Calcium: 8.1 mg/dL — ABNORMAL LOW (ref 8.9–10.3)
Chloride: 105 mmol/L (ref 98–111)
Creatinine, Ser: 0.95 mg/dL (ref 0.44–1.00)
GFR, Estimated: 57 mL/min — ABNORMAL LOW (ref >60)
Glucose, Bld: 105 mg/dL — ABNORMAL HIGH (ref 70–99)
Potassium: 4.1 mmol/L (ref 3.5–5.1)
Sodium: 137 mmol/L (ref 135–145)

## 2022-02-27 LAB — CBC
HCT: 26.2 % — ABNORMAL LOW (ref 36.0–46.0)
Hemoglobin: 8.5 g/dL — ABNORMAL LOW (ref 12.0–15.0)
MCH: 33.9 pg (ref 26.0–34.0)
MCHC: 32.4 g/dL (ref 30.0–36.0)
MCV: 104.4 fL — ABNORMAL HIGH (ref 80.0–100.0)
Platelets: 171 10*3/uL (ref 150–400)
RBC: 2.51 MIL/uL — ABNORMAL LOW (ref 3.87–5.11)
RDW: 15.8 % — ABNORMAL HIGH (ref 11.5–15.5)
WBC: 7.5 10*3/uL (ref 4.0–10.5)
nRBC: 0 % (ref 0.0–0.2)

## 2022-02-27 MED ORDER — CEFAZOLIN SODIUM-DEXTROSE 1-4 GM/50ML-% IV SOLN
1.0000 g | Freq: Four times a day (QID) | INTRAVENOUS | Status: AC
  Administered 2022-02-27 (×2): 1 g via INTRAVENOUS
  Filled 2022-02-27 (×2): qty 50

## 2022-02-27 MED ORDER — SODIUM CHLORIDE 0.9 % IV BOLUS: 500.0000 mL | Freq: Once | INTRAVENOUS | Status: DC

## 2022-02-27 MED ORDER — HYDROCODONE-ACETAMINOPHEN 5-325 MG PO TABS: 1.0000 | ORAL_TABLET | ORAL | 0 refills | Status: DC | PRN

## 2022-02-27 MED ORDER — ENOXAPARIN SODIUM 40 MG/0.4ML IJ SOSY: 40.0000 mg | PREFILLED_SYRINGE | INTRAMUSCULAR | 0 refills | Status: DC

## 2022-02-27 NOTE — Progress Notes (Signed)
  Subjective: 1 Day Post-Op Procedure(s) (LRB): Left hip affixus removal with repeat fixation with Affixus (Left) Patient reports pain as moderate.   Patient is well, and has had no acute complaints or problems Plan is to go home versus Rehab after hospital stay. Negative for chest pain and shortness of breath Fever: no Gastrointestinal: Negative for nausea and vomiting  Objective: Vital signs in last 24 hours: Temp:  [97 F (36.1 C)-98.6 F (37 C)] 98.2 F (36.8 C) (07/01 0538) Pulse Rate:  [50-81] 81 (07/01 0538) Resp:  [12-26] 17 (07/01 0538) BP: (108-164)/(56-82) 110/56 (07/01 0538) SpO2:  [95 %-100 %] 95 % (07/01 0538) Weight:  [45.4 kg] 45.4 kg (06/30 1218)  Intake/Output from previous day:  Intake/Output Summary (Last 24 hours) at 02/27/2022 0658 Last data filed at 02/27/2022 8676 Gross per 24 hour  Intake 1374.36 ml  Output 250 ml  Net 1124.36 ml    Intake/Output this shift: Total I/O In: 874.4 [I.V.:774.4; IV Piggyback:100] Out: 150 [Urine:150]  Labs: Recent Labs    02/26/22 1747 02/27/22 0430  HGB 10.1* 8.5*   Recent Labs    02/26/22 1747 02/27/22 0430  WBC 8.6 7.5  RBC 2.98* 2.51*  HCT 31.3* 26.2*  PLT 204 171   Recent Labs    02/26/22 1747 02/27/22 0430  NA  --  137  K  --  4.1  CL  --  105  CO2  --  25  BUN  --  28*  CREATININE 0.86 0.95  GLUCOSE  --  105*  CALCIUM  --  8.1*   No results for input(s): "LABPT", "INR" in the last 72 hours.   EXAM General - Patient is Alert and Oriented Extremity - Neurovascular intact Sensation intact distally Dorsiflexion/Plantar flexion intact Compartment soft Dressing/Incision - clean, dry, no drainage Motor Function - intact, moving foot and toes well on exam.   Past Medical History:  Diagnosis Date   Adnexal mass    Anemia    Arthritis    left wrist, bilateral hips   Chronic kidney disease    Stage 3b   Lactic acidosis    Macular degeneration    Orthostatic hypotension    Osteoporosis     Seasonal allergies    Sinus bradycardia    Syncope 02/21/2022   Vertigo    rare    Assessment/Plan: 1 Day Post-Op Procedure(s) (LRB): Left hip affixus removal with repeat fixation with Affixus (Left) Principal Problem:   S/P ORIF (open reduction internal fixation) fracture  Estimated body mass index is 17.16 kg/m as calculated from the following:   Height as of this encounter: 5\' 4"  (1.626 m).   Weight as of this encounter: 45.4 kg. Advance diet Up with therapy  DVT Prophylaxis - Lovenox, Foot Pumps, and TED hose Weight-Bearing as tolerated to left leg  , PA-C Orthopaedic Surgery 02/27/2022, 6:58 AM

## 2022-02-27 NOTE — Evaluation (Addendum)
Physical Therapy Evaluation Patient Details Name: Cassie Solis MRN: [id number] DOB: April 09, 1929 Today's Date: 02/27/2022  History of Present Illness  Pt is a 88 y/o F admitted on 02/26/22. Pt with hx of fall 2/2 passing out at home on 02/21/22 resulting in L hip pain. Pt is s/p L ORIF on 01/07/22. Imaging showed a screw had shifted. Pt underwent sx on 02/26/22 for Left hip affixus removal with repeat fixation with Affixus. PMH: anemia, arthritis, CKD stage 3B, orthostatic hypotension, syncope, vertigo  Clinical Impression  Pt seen for PT evaluation with pt agreeable to tx. Pt reports following previous surgery she had returned home from STR where her children were providing almost 24 hr supervision & she was ambulating with a RW. On this date, pt performs LLE strengthening exercises with cuing for technique & noted increased pain with L knee flexion. Pt requires min assist for supine>sit with reliance on hospital bed features. Pt completes step pivot bed>recliner with pt reporting after turning to chair feeling as if she would pass out, but no symptoms prior to this. Pt noted to be + for orthostatic hypotension & team notified. Anticipate pt can d/c home with 24 hr assistance if family can provide this. Will continue to follow pt acutely to address endurance, strengthening, and gait with LRAD.  BP checked in LUE: Supine: 113/60 mmHg (MAP 70), HR 75 bpm Sitting: 100/62 mmHg (MAP 73), HR 85 bpm Standing at 0: 84/45 mmHg (MAP 56), HR 149 bpm -- question accuracy of HR, pt denies symptoms Had pt turn to recliner & attempt standing longer to see if BP increased with standing but pt then began c/o feeling as if she'd pass out so transferred to sitting in recliner with BLE elevated Sitting in recliner with BLE elevated: 76/47 mmHg (MAP 56) Fully supine in recliner: 111/55 mmHg (MAP 72), HR 71 bpm Sitting upright in recliner with BLE elevated: 113/52 mmHg (MAP 69), HR 66 bpm     70 Recommendations for follow  up therapy are one component of a multi-disciplinary discharge planning process, led by the attending physician.  Recommendations may be updated based on patient status, additional functional criteria and insurance authorization.  Follow Up Recommendations Home health PT      Assistance Recommended at Discharge Frequent or constant Supervision/Assistance  Patient can return home with the following  A little help with bathing/dressing/bathroom;A little help with walking and/or transfers;Assistance with cooking/housework;Help with stairs or ramp for entrance;Direct supervision/assist for medications management;Assist for transportation    Equipment Recommendations None recommended by PT  Recommendations for Other Services  OT consult    Functional Status Assessment Patient has had a recent decline in their functional status and demonstrates the ability to make significant improvements in function in a reasonable and predictable amount of time.     Precautions / Restrictions Precautions Precautions: Fall Restrictions Weight Bearing Restrictions: Yes LLE Weight Bearing: Weight bearing as tolerated      Mobility  Bed Mobility Overal bed mobility: Needs Assistance Bed Mobility: Supine to Sit     Supine to sit: Min assist, HOB elevated     General bed mobility comments: use of bed rails, assistance to scoot buttocks to EOB once in sitting    Transfers Overall transfer level: Needs assistance Equipment used: Rolling walker (2 wheels) Transfers: Sit to/from Stand, Bed to chair/wheelchair/BSC Sit to Stand: Min assist   Step pivot transfers: Min assist       General transfer comment: cuing for safe hand placement during  STS transfer    Ambulation/Gait                  Stairs            Wheelchair Mobility    Modified Rankin (Stroke Patients Only)       Balance Overall balance assessment: Needs assistance Sitting-balance support: Feet supported,  Bilateral upper extremity supported Sitting balance-Leahy Scale: Fair Sitting balance - Comments: min assist fade to close supervision static sitting, pt with preference to lean R to decrease weight bearing through L hip   Standing balance support: Bilateral upper extremity supported, During functional activity, Reliant on assistive device for balance Standing balance-Leahy Scale: Poor                               Pertinent Vitals/Pain Pain Assessment Pain Assessment: 0-10 Pain Score: 6  Pain Location: L hip Pain Descriptors / Indicators: Grimacing, Discomfort Pain Intervention(s): Monitored during session, Patient requesting pain meds-RN notified (utilized ice pack on L hip at beginning & end of session)    Home Living Family/patient expects to be discharged to:: Private residence Living Arrangements: Alone Available Help at Discharge: Family;Available PRN/intermittently (Children were providing almost 24 hr supervision) Type of Home: House Home Access: Ramped entrance       Home Layout: One level Home Equipment: Conservation officer, nature (2 wheels)      Prior Function Prior Level of Function : Independent/Modified Independent             Mobility Comments: Prior to original sx pt was independent without AD, since sx pt had returned home from STR & was ambulating with RW with supervision.       Hand Dominance        Extremity/Trunk Assessment        Lower Extremity Assessment Lower Extremity Assessment: Generalized weakness;LLE deficits/detail LLE Deficits / Details: 3-/5 L knee extension in sitting, increased pain with knee flexion       Communication   Communication: No difficulties  Cognition Arousal/Alertness: Awake/alert Behavior During Therapy: WFL for tasks assessed/performed Overall Cognitive Status: Within Functional Limits for tasks assessed                                 General Comments: AxOx4, sweet lady, follows commands  throughout session, voices frustration re: falling        General Comments      Exercises General Exercises - Lower Extremity Ankle Circles/Pumps: AROM, Both, 10 reps, Supine Quad Sets: AROM, Strengthening, Left, 10 reps, Supine Heel Slides: AAROM, Strengthening, Left, 10 reps, Supine Hip ABduction/ADduction: AAROM, Strengthening, Left, 10 reps, Supine   Assessment/Plan    PT Assessment Patient needs continued PT services  PT Problem List Decreased strength;Pain;Decreased range of motion;Decreased activity tolerance;Decreased knowledge of use of DME;Decreased safety awareness;Decreased balance;Decreased mobility;Decreased knowledge of precautions       PT Treatment Interventions DME instruction;Gait training;Balance training;Therapeutic exercise;Stair training;Neuromuscular re-education;Modalities;Functional mobility training;Cognitive remediation;Patient/family education;Therapeutic activities    PT Goals (Current goals can be found in the Care Plan section)  Acute Rehab PT Goals Patient Stated Goal: return home PT Goal Formulation: With patient Time For Goal Achievement: 03/13/22 Potential to Achieve Goals: Good    Frequency BID     Co-evaluation               AM-PAC PT "6 Clicks" Mobility  Outcome  Measure Help needed turning from your back to your side while in a flat bed without using bedrails?: A Little Help needed moving from lying on your back to sitting on the side of a flat bed without using bedrails?: A Lot Help needed moving to and from a bed to a chair (including a wheelchair)?: A Little Help needed standing up from a chair using your arms (e.g., wheelchair or bedside chair)?: A Little Help needed to walk in hospital room?: A Little Help needed climbing 3-5 steps with a railing? : A Lot 6 Click Score: 16    End of Session Equipment Utilized During Treatment: Gait belt Activity Tolerance: Treatment limited secondary to medical complications (Comment)  (limited 2/2 low BP) Patient left: in chair;with chair alarm set;with call bell/phone within reach Nurse Communication: Mobility status (BP during session) PT Visit Diagnosis: Unsteadiness on feet (R26.81);Muscle weakness (generalized) (M62.81);Difficulty in walking, not elsewhere classified (R26.2);Pain Pain - Right/Left: Left Pain - part of body: Hip    Time: 7494-4967 PT Time Calculation (min) (ACUTE ONLY): 25 min   Charges:   PT Evaluation $PT Eval Moderate Complexity: 1 Mod PT Treatments $Therapeutic Activity: 8-22 mins        Aleda Grana, PT, DPT 02/27/22, 8:55 AM   Sandi Mariscal 02/27/2022, 8:46 AM

## 2022-02-27 NOTE — Discharge Instructions (Addendum)
INSTRUCTIONS AFTER Surgery  Remove items at home which could result in a fall. This includes throw rugs or furniture in walking pathways ICE to the affected joint every three hours while awake for 30 minutes at a time, for at least the first 3-5 days, and then as needed for pain and swelling.  Continue to use ice for pain and swelling. You may notice swelling that will progress down to the foot and ankle.  This is normal after surgery.  Elevate your leg when you are not up walking on it.   Continue to use the breathing machine you got in the hospital (incentive spirometer) which will help keep your temperature down.  It is common for your temperature to cycle up and down following surgery, especially at night when you are not up moving around and exerting yourself.  The breathing machine keeps your lungs expanded and your temperature down.   DIET:  As you were doing prior to hospitalization, we recommend a well-balanced diet.  DRESSING / WOUND CARE / SHOWERING  Dressing change as needed.  No showering.  Staples will be removed at [facility] clinic orthopedics in 2 weeks.  ACTIVITY  Increase activity slowly as tolerated, but follow the weight bearing instructions below.   No driving for 6 weeks or until further direction given by your physician.  You cannot drive while taking narcotics.  No lifting or carrying greater than 10 lbs. until further directed by your surgeon. Avoid periods of inactivity such as sitting longer than an hour when not asleep. This helps prevent blood clots.  You may return to work once you are authorized by your doctor.     WEIGHT BEARING  Weightbearing as tolerated on the left   EXERCISES Gait training and ambulation training.  CONSTIPATION  Constipation is defined medically as fewer than three stools per week and severe constipation as less than one stool per week.  Even if you have a regular bowel pattern at home, your normal regimen is likely to be disrupted  due to multiple reasons following surgery.  Combination of anesthesia, postoperative narcotics, change in appetite and fluid intake all can affect your bowels.   YOU MUST use at least one of the following options; they are listed in order of increasing strength to get the job done.  They are all available over the counter, and you may need to use some, POSSIBLY even all of these options:    Drink plenty of fluids (prune juice may be helpful) and high fiber foods Colace 100 mg by mouth twice a day  Senokot for constipation as directed and as needed Dulcolax (bisacodyl), take with full glass of water  Miralax (polyethylene glycol) once or twice a day as needed.  If you have tried all these things and are unable to have a bowel movement in the first 3-4 days after surgery call either your surgeon or your primary doctor.    If you experience loose stools or diarrhea, hold the medications until you stool forms back up.  If your symptoms do not get better within 1 week or if they get worse, check with your doctor.  If you experience "the worst abdominal pain ever" or develop nausea or vomiting, please contact the office immediately for further recommendations for treatment.   ITCHING:  If you experience itching with your medications, try taking only a single pain pill, or even half a pain pill at a time.  You can also use Benadryl over the counter for itching  or also to help with sleep.   TED HOSE STOCKINGS:  Use stockings on both legs until for at least 2 weeks or as directed by physician office. They may be removed at night for sleeping.  MEDICATIONS:  See your medication summary on the "After Visit Summary" that nursing will review with you.  You may have some home medications which will be placed on hold until you complete the course of blood thinner medication.  It is important for you to complete the blood thinner medication as prescribed.  PRECAUTIONS:  If you experience chest pain or shortness  of breath - call 911 immediately for transfer to the hospital emergency department.   If you develop a fever greater that 101 F, purulent drainage from wound, increased redness or drainage from wound, foul odor from the wound/dressing, or calf pain - CONTACT YOUR SURGEON.                                                   FOLLOW-UP APPOINTMENTS:  If you do not already have a post-op appointment, please call the office for an appointment to be seen by your surgeon.  Guidelines for how soon to be seen are listed in your "After Visit Summary", but are typically between 1-4 weeks after surgery.  OTHER INSTRUCTIONS:     MAKE SURE YOU:  Understand these instructions.  Get help right away if you are not doing well or get worse.    Thank you for letting us be a part of your medical care team.  It is a privilege we respect greatly.  We hope these instructions will help you stay on track for a fast and full recovery!

## 2022-02-27 NOTE — TOC Progression Note (Addendum)
Transition of Care [facility]) - Progression Note    Patient Details  Name: Cassie Solis MRN: [id number] Date of Birth: 13-Sep-1928  Transition of Care [facility]) CM/SW Contact  Bing Quarry, RN Phone Number: 02/27/2022, 12:19 PM  Clinical Narrative:  7/1: New PT/OT recommendations for [facility] PT/OT. Open with [facility] for [facility]. DME recommendations from OT only: BSC/3:1. Provider: Please place DME orders if needed. EDD not defined. Gabriel Cirri RN CM   415 pm Update: Spoke with son regarding being able to provide 24 hour care/assistance. He stated they are set up pretty well from last surgery/rehab with grab bars, ramp rails, walker, bedroom and bath on main level with no steps, family/friends stayed with her a couple hours in the day and then the night. Will discuss with family 24 hour coverage and investigate private services. TOC to follow up as PT sees patient again as there were some BP issues with therapy this am. Pain control issues as well.  Has a walker but not BSC/3:1. Gabriel Cirri RN CM   PCP and Pharmacy confirmed as in chart. Gabriel Cirri RN CM        Expected Discharge Plan and Services                                                 Social Determinants of Health (SDOH) Interventions    Readmission Risk Interventions    01/08/2022    1:46 PM  Readmission Risk Prevention Plan  Transportation Screening Complete  PCP or Specialist Appt within 5-7 Days Complete  Home Care Screening Complete  Medication Review (RN CM) Complete

## 2022-02-27 NOTE — Evaluation (Signed)
Occupational Therapy Evaluation Patient Details Name: Cassie Solis MRN: [id number] DOB: 06/29/1929 Today's Date: 02/27/2022   History of Present Illness Pt is a 88 y/o F admitted on 02/26/22. Pt with hx of fall 2/2 passing out at home on 02/21/22 resulting in L hip pain. Pt is s/p L ORIF on 01/07/22. Imaging showed a screw had shifted. Pt underwent sx on 02/26/22 for Left hip affixus removal with repeat fixation with Affixus. PMH: anemia, arthritis, CKD stage 3B, orthostatic hypotension, syncope, vertigo   Clinical Impression   Ms. Shimamoto was seen for OT evaluation this date. Prior to hospital admission, pt was residing at home with near 24/7 assist from her family/PCAs. Pt had recently DC'd from [facility] after her initial sx and had been working with [facility] OT/PT to improve safety and independence with functional mobility and ADL management. Pt reports she required intermittent MIN A to SUPERVISION for ADL management prior to her fall. Currently pt demonstrates impairments as described below (See OT problem list) which functionally limit her ability to perform ADL/self-care tasks. Pt currently requires MIN A for functional mobility & MOD A for LB ADL management from STS. Pt noted to have +orthostatic vitals during session. Further mobility/functional activity deferred and RN aware. Pt would benefit from skilled OT services to address noted impairments and functional limitations (see below for any additional details) in order to maximize safety and independence while minimizing falls risk and caregiver burden. Upon hospital discharge, recommend HHOT to maximize pt safety and return to functional independence during meaningful occupations of daily life.        Recommendations for follow up therapy are one component of a multi-disciplinary discharge planning process, led by the attending physician.  Recommendations may be updated based on patient status, additional functional criteria and insurance authorization.    Follow Up Recommendations  Home health OT    Assistance Recommended at Discharge Frequent or constant Supervision/Assistance  Patient can return home with the following A little help with walking and/or transfers;A little help with bathing/dressing/bathroom;Assist for transportation;Help with stairs or ramp for entrance    Functional Status Assessment  Patient has had a recent decline in their functional status and demonstrates the ability to make significant improvements in function in a reasonable and predictable amount of time.  Equipment Recommendations  BSC/3in1    Recommendations for Other Services       Precautions / Restrictions Precautions Precautions: Fall Precaution Comments: Monitor orthostatics Restrictions Weight Bearing Restrictions: Yes LLE Weight Bearing: Weight bearing as tolerated      Mobility Bed Mobility Overal bed mobility: Needs Assistance             General bed mobility comments: deferred. Pt in recliner at start/end of session.    Transfers Overall transfer level: Needs assistance Equipment used: Rolling walker (2 wheels) Transfers: Sit to/from Stand Sit to Stand: Min assist           General transfer comment: Further mobility deferred 2/2 +orthostatic vitals. RN in room to hang fluids at end of session.      Balance Overall balance assessment: Needs assistance Sitting-balance support: Feet supported, Bilateral upper extremity supported Sitting balance-Leahy Scale: Good Sitting balance - Comments: Steady static sitting in recliner, no LOB appreciated with functional weight shift during simulated LB dressing.   Standing balance support: Bilateral upper extremity supported, During functional activity, Reliant on assistive device for balance Standing balance-Leahy Scale: Fair  ADL either performed or assessed with clinical judgement   ADL Overall ADL's : Needs assistance/impaired                                        General ADL Comments: Pt is functionally lmited by decreased AROM and increased pain in her LLE. She requires MOD A for LB dressing and bathing from STS. MIN A for functional mobility. SET UP assist for UB ADL management.     Vision         Perception     Praxis      Pertinent Vitals/Pain Pain Assessment Pain Assessment: 0-10 Pain Score: 8  Pain Location: L hip Pain Descriptors / Indicators: Grimacing, Discomfort Pain Intervention(s): Limited activity within patient's tolerance, Monitored during session, Ice applied, Utilized relaxation techniques, Repositioned, Premedicated before session     Hand Dominance Right   Extremity/Trunk Assessment Upper Extremity Assessment Upper Extremity Assessment: Generalized weakness   Lower Extremity Assessment Lower Extremity Assessment: Generalized weakness;Defer to PT evaluation LLE Deficits / Details: s/p L hip sx. pain limited.       Communication Communication Communication: No difficulties   Cognition Arousal/Alertness: Awake/alert Behavior During Therapy: WFL for tasks assessed/performed Overall Cognitive Status: Within Functional Limits for tasks assessed                                       General Comments  Vitals monitored t/o session. BP at start of session with pt resting in recliner noted to be 121/63; 127/63 sitting upright without back support in recliner, drops to 90/52 with pt in standing (asymptomatic), and is 110/53 at end of session with pt seated back in recliner. RN notified/aware.    Exercises Other Exercises Other Exercises: Time to assess orthostatic vitals, Pt/son educated on role of OT in acute setting, safe use of AE/DME for ADL management, DC recs, and falls prevention strategies for home and hospital.   Shoulder Instructions      Home Living Family/patient expects to be discharged to:: Private residence Living Arrangements:  Alone Available Help at Discharge: Family;Available PRN/intermittently;Personal care attendant (PCAs and family were providing near 24/7 assistance. Son voices plans to ensure 24/7 assist available.) Type of Home: House Home Access: Ramped entrance     Home Layout: One level     Bathroom Shower/Tub: Teacher, early years/pre: Standard     Home Equipment: Conservation officer, nature (2 wheels)          Prior Functioning/Environment Prior Level of Function : Independent/Modified Independent             Mobility Comments: Prior to original sx pt was independent without AD, since sx pt had returned home from [place] & was ambulating with RW with supervision. ADLs Comments: Pt was MOD I -I for all ADL management prior to sx. Since initial sx pt has returned home from [place] and required MIN A to supervision for ADL management from children/PCAs was working with [facility] OT prior to fall.        OT Problem List: Decreased strength;Decreased coordination;Pain;Decreased range of motion;Decreased activity tolerance;Decreased safety awareness;Impaired balance (sitting and/or standing);Decreased knowledge of use of DME or AE;Decreased knowledge of precautions      OT Treatment/Interventions: Self-care/ADL training;Therapeutic exercise;Therapeutic activities;DME and/or AE instruction;Patient/family education;Balance training;Energy conservation    OT  Goals(Current goals can be found in the care plan section) Acute Rehab OT Goals Patient Stated Goal: to go home OT Goal Formulation: With patient/family Time For Goal Achievement: 03/13/22 Potential to Achieve Goals: Good ADL Goals Pt Will Perform Grooming: sitting;with modified independence Pt Will Perform Lower Body Dressing: sit to/from stand;with min assist;with caregiver independent in assisting;with adaptive equipment (c LRAD PRN) Pt Will Transfer to Toilet: bedside commode;stand pivot transfer;with supervision;with set-up (c LRAD PRN) Pt Will  Perform Toileting - Clothing Manipulation and hygiene: sit to/from stand;with set-up;with supervision;with adaptive equipment;with caregiver independent in assisting (c LRAD PRN)  OT Frequency: Min 2X/week    Co-evaluation              AM-PAC OT "6 Clicks" Daily Activity     Outcome Measure Help from another person eating meals?: A Little Help from another person taking care of personal grooming?: A Little Help from another person toileting, which includes using toliet, bedpan, or urinal?: A Little Help from another person bathing (including washing, rinsing, drying)?: A Little Help from another person to put on and taking off regular upper body clothing?: A Little Help from another person to put on and taking off regular lower body clothing?: A Lot 6 Click Score: 17   End of Session Equipment Utilized During Treatment: Gait belt;Rolling walker (2 wheels) Nurse Communication: Mobility status;Other (comment) (vitals)  Activity Tolerance: Treatment limited secondary to medical complications (Comment) (orthostatic) Patient left: in chair;with call bell/phone within reach;with chair alarm set;with family/visitor present  OT Visit Diagnosis: Other abnormalities of gait and mobility (R26.89);Muscle weakness (generalized) (M62.81);Pain Pain - Right/Left: Left Pain - part of body: Hip                Time: 5409-8119 OT Time Calculation (min): 31 min Charges:  OT General Charges $OT Visit: 1 Visit OT Evaluation $OT Eval Moderate Complexity: 1 Mod OT Treatments $Self Care/Home Management : 8-22 mins  Rockney Ghee, M.S., OTR/L Ascom: [phone] 02/27/22, 11:40 AM

## 2022-02-27 NOTE — Progress Notes (Signed)
Physical Therapy Treatment Patient Details Name: Cassie Solis MRN: [id number] DOB: Aug 30, 1929 Today's Date: 02/27/2022   History of Present Illness Pt is a 88 y/o F admitted on 02/26/22. Pt with hx of fall 2/2 passing out at home on 02/21/22 resulting in L hip pain. Pt is s/p L ORIF on 01/07/22. Imaging showed a screw had shifted. Pt underwent sx on 02/26/22 for Left hip affixus removal with repeat fixation with Affixus. PMH: anemia, arthritis, CKD stage 3B, orthostatic hypotension, syncope, vertigo    PT Comments    Pt seen for PT tx with pt endorsing increased pain in L hip compared to this morning but eager to walk. Pt requires min/mod assist for sit>stand & step pivot recliner>bed but limits weight bearing through LLE with pt initially keeping heel off ground but able to place it more flat with cuing. Pt with decreased weight shift to L & hopping on R foot to complete transfer so gait deferred at this time. Pt assisted to supine & provided ice pack for L hip. Reviewed use of incentive spirometer with pt but pt would benefit from further education. Will continue to progress mobility as able.    Recommendations for follow up therapy are one component of a multi-disciplinary discharge planning process, led by the attending physician.  Recommendations may be updated based on patient status, additional functional criteria and insurance authorization.  Follow Up Recommendations  Home health PT     Assistance Recommended at Discharge Frequent or constant Supervision/Assistance  Patient can return home with the following Assistance with cooking/housework;Help with stairs or ramp for entrance;Direct supervision/assist for medications management;Assist for transportation;A lot of help with walking and/or transfers;A lot of help with bathing/dressing/bathroom   Equipment Recommendations  None recommended by PT    Recommendations for Other Services       Precautions / Restrictions  Precautions Precautions: Fall Precaution Comments: Monitor orthostatics Restrictions Weight Bearing Restrictions: Yes LLE Weight Bearing: Weight bearing as tolerated     Mobility  Bed Mobility Overal bed mobility: Needs Assistance Bed Mobility: Sit to Supine     Supine to sit: Min assist, HOB elevated Sit to supine: Min assist, HOB elevated (to elevate LLE onto bed)   General bed mobility comments: able to scoot to [facility] with cuing for technique    Transfers Overall transfer level: Needs assistance Equipment used: Rolling walker (2 wheels) Transfers: Bed to chair/wheelchair/BSC, Sit to/from Stand Sit to Stand: Min assist, Mod assist   Step pivot transfers: Min assist, Mod assist       General transfer comment: cuing for safe hand placement during STS transfer    Ambulation/Gait                   Stairs             Wheelchair Mobility    Modified Rankin (Stroke Patients Only)       Balance Overall balance assessment: Needs assistance Sitting-balance support: Feet supported, Bilateral upper extremity supported Sitting balance-Leahy Scale: Fair Sitting balance - Comments: min assist fade to close supervision static sitting, pt with preference to lean R to decrease weight bearing through L hip   Standing balance support: Bilateral upper extremity supported, During functional activity, Reliant on assistive device for balance Standing balance-Leahy Scale: Poor                              Cognition Arousal/Alertness: Awake/alert Behavior During Therapy: [facility]  for tasks assessed/performed Overall Cognitive Status: Within Functional Limits for tasks assessed                                 General Comments: AxOx4, sweet lady, follows commands throughout session        Exercises General Exercises - Lower Extremity Ankle Circles/Pumps: AROM, Both, 10 reps, Supine Quad Sets: AROM, Strengthening, Left, 10 reps, Supine Heel  Slides: AAROM, Strengthening, Left, 10 reps, Supine Hip ABduction/ADduction: AAROM, Strengthening, Left, 10 reps, Supine    General Comments General comments (skin integrity, edema, etc.): BP supine at end of session: 130/69 mmHg MAP 86, HR 77 bpm      Pertinent Vitals/Pain Pain Assessment Pain Assessment: Faces Pain Score: 6  Faces Pain Scale: Hurts whole lot (pt reports 4/10 but pt also notes pt was worse than AM session when she rated it 6/10) Pain Location: L hip Pain Descriptors / Indicators: Grimacing, Discomfort Pain Intervention(s): Premedicated before session, Limited activity within patient's tolerance, Monitored during session, Repositioned (provided ice pack at end of session)    Home Living Family/patient expects to be discharged to:: Private residence Living Arrangements: Alone Available Help at Discharge: Family;Available PRN/intermittently;Personal care attendant (PCAs and family were providing near 24/7 assistance. Son voices plans to ensure 24/7 assist available.) Type of Home: House Home Access: Ramped entrance       Home Layout: One level Home Equipment: Agricultural consultant (2 wheels)      Prior Function            PT Goals (current goals can now be found in the care plan section) Acute Rehab PT Goals Patient Stated Goal: return home PT Goal Formulation: With patient Time For Goal Achievement: 03/13/22 Potential to Achieve Goals: Good Progress towards PT goals: Progressing toward goals    Frequency    BID      PT Plan Current plan remains appropriate    Co-evaluation              AM-PAC PT "6 Clicks" Mobility   Outcome Measure  Help needed turning from your back to your side while in a flat bed without using bedrails?: A Little Help needed moving from lying on your back to sitting on the side of a flat bed without using bedrails?: A Lot Help needed moving to and from a bed to a chair (including a wheelchair)?: A Lot Help needed standing  up from a chair using your arms (e.g., wheelchair or bedside chair)?: A Lot Help needed to walk in hospital room?: A Lot Help needed climbing 3-5 steps with a railing? : Total 6 Click Score: 12    End of Session Equipment Utilized During Treatment: Gait belt Activity Tolerance: Patient limited by pain Patient left: in bed;with call bell/phone within reach;with bed alarm set;with SCD's reapplied Nurse Communication: Mobility status PT Visit Diagnosis: Unsteadiness on feet (R26.81);Muscle weakness (generalized) (M62.81);Difficulty in walking, not elsewhere classified (R26.2);Pain;Other abnormalities of gait and mobility (R26.89) Pain - Right/Left: Left Pain - part of body: Hip     Time: 5784-6962 PT Time Calculation (min) (ACUTE ONLY): 16 min  Charges:  $Therapeutic Activity: 8-22 mins                     Aleda Grana, PT, DPT 02/27/22, 1:38 PM   Sandi Mariscal 02/27/2022, 1:37 PM

## 2022-02-28 ENCOUNTER — Inpatient Hospital Stay: Payer: PPO

## 2022-02-28 LAB — CBC
HCT: 21 % — ABNORMAL LOW (ref 36.0–46.0)
Hemoglobin: 6.9 g/dL — ABNORMAL LOW (ref 12.0–15.0)
MCH: 34.5 pg — ABNORMAL HIGH (ref 26.0–34.0)
MCHC: 32.9 g/dL (ref 30.0–36.0)
MCV: 105 fL — ABNORMAL HIGH (ref 80.0–100.0)
Platelets: 155 10*3/uL (ref 150–400)
RBC: 2 MIL/uL — ABNORMAL LOW (ref 3.87–5.11)
RDW: 15.9 % — ABNORMAL HIGH (ref 11.5–15.5)
WBC: 11.9 10*3/uL — ABNORMAL HIGH (ref 4.0–10.5)
nRBC: 0 % (ref 0.0–0.2)

## 2022-02-28 LAB — PREPARE RBC (CROSSMATCH)

## 2022-02-28 LAB — HEMOGLOBIN AND HEMATOCRIT, BLOOD
HCT: 22.1 % — ABNORMAL LOW (ref 36.0–46.0)
Hemoglobin: 7.3 g/dL — ABNORMAL LOW (ref 12.0–15.0)

## 2022-02-28 MED ORDER — SODIUM CHLORIDE 0.9% IV SOLUTION: Freq: Once | INTRAVENOUS | Status: DC

## 2022-02-28 MED ORDER — ENOXAPARIN SODIUM 30 MG/0.3ML IJ SOSY
30.0000 mg | PREFILLED_SYRINGE | INTRAMUSCULAR | Status: DC
  Administered 2022-03-02: 30 mg via SUBCUTANEOUS
  Filled 2022-02-28: qty 0.3

## 2022-02-28 MED ORDER — SODIUM CHLORIDE 0.9% IV SOLUTION: Freq: Once | INTRAVENOUS | Status: AC

## 2022-02-28 NOTE — Progress Notes (Signed)
Patient ace wrap dressing saturated with blood. ABD pads x2 applied as well as new ace wrap.

## 2022-02-28 NOTE — Plan of Care (Signed)
  Problem: Nutrition: Goal: Adequate nutrition will be maintained Outcome: Not Progressing   Problem: Pain Managment: Goal: General experience of comfort will improve Outcome: Progressing   Problem: Activity: Goal: Risk for activity intolerance will decrease Outcome: Progressing   Problem: Coping: Goal: Level of anxiety will decrease Outcome: Progressing

## 2022-02-28 NOTE — Progress Notes (Signed)
  Subjective: 2 Days Post-Op Procedure(s) (LRB): Left hip affixus removal with repeat fixation with Affixus (Left) Patient reports pain as mild to moderate.   Patient is well, and has had no acute complaints or problems Plan is to go home versus Rehab after hospital stay. Negative for chest pain and shortness of breath Fever: no Gastrointestinal: Negative for nausea and vomiting  Objective: Vital signs in last 24 hours: Temp:  [98.3 F (36.8 C)-99.3 F (37.4 C)] 98.3 F (36.8 C) (07/02 0419) Pulse Rate:  [73-94] 94 (07/02 0419) Resp:  [16-20] 20 (07/02 0419) BP: (108-130)/(51-65) 108/51 (07/02 0419) SpO2:  [95 %-98 %] 96 % (07/02 0419)  Intake/Output from previous day:  Intake/Output Summary (Last 24 hours) at 02/28/2022 0716 Last data filed at 02/27/2022 1809 Gross per 24 hour  Intake 360 ml  Output 400 ml  Net -40 ml    Intake/Output this shift: No intake/output data recorded.  Labs: Recent Labs    02/26/22 1747 02/27/22 0430 02/28/22 0430  HGB 10.1* 8.5* 6.9*   Recent Labs    02/27/22 0430 02/28/22 0430  WBC 7.5 11.9*  RBC 2.51* 2.00*  HCT 26.2* 21.0*  PLT 171 155   Recent Labs    02/26/22 1747 02/27/22 0430  NA  --  137  K  --  4.1  CL  --  105  CO2  --  25  BUN  --  28*  CREATININE 0.86 0.95  GLUCOSE  --  105*  CALCIUM  --  8.1*   No results for input(s): "LABPT", "INR" in the last 72 hours.   EXAM General - Patient is Alert and Oriented Extremity - Neurovascular intact Sensation intact distally Dorsiflexion/Plantar flexion intact Compartment soft Dressing/Incision - clean, dry, mild to moderate drainage.  The distal incision had the Ace wrap reapplied with more ABDs. Motor Function - intact, moving foot and toes well on exam.   Past Medical History:  Diagnosis Date   Adnexal mass    Anemia    Arthritis    left wrist, bilateral hips   Chronic kidney disease    Stage 3b   Lactic acidosis    Macular degeneration    Orthostatic  hypotension    Osteoporosis    Seasonal allergies    Sinus bradycardia    Syncope 02/21/2022   Vertigo    rare    Assessment/Plan: 2 Days Post-Op Procedure(s) (LRB): Left hip affixus removal with repeat fixation with Affixus (Left) Principal Problem:   S/P ORIF (open reduction internal fixation) fracture  Estimated body mass index is 17.16 kg/m as calculated from the following:   Height as of this encounter: 5\' 4"  (1.626 m).   Weight as of this encounter: 45.4 kg. Advance diet Up with therapy  Acute blood loss anemia.  Postop surgical.  Hemoglobin 6.9.  Transfuse 1 unit packed red blood.  Recheck labs.  Discharge planning to go home with home health physical therapy, possible Monday.  DVT Prophylaxis - Lovenox, Foot Pumps, and TED hose Weight-Bearing as tolerated to left leg  Saturday, PA-C Orthopaedic Surgery 02/28/2022, 7:16 AM

## 2022-02-28 NOTE — Anesthesia Postprocedure Evaluation (Signed)
Anesthesia Post Note  Patient: Cassie Solis  Procedure(s) Performed: Left hip affixus removal with repeat fixation with Affixus (Left: Hip)  Patient location during evaluation: Nursing Unit Anesthesia Type: Spinal Level of consciousness: awake and alert Pain management: pain level controlled Vital Signs Assessment: post-procedure vital signs reviewed and stable Respiratory status: spontaneous breathing and respiratory function stable Cardiovascular status: blood pressure returned to baseline and stable Postop Assessment: no headache, no backache, no apparent nausea or vomiting, able to ambulate and adequate PO intake Anesthetic complications: no   No notable events documented.   Last Vitals:  Vitals:   02/28/22 1030 02/28/22 1035  BP:  (!) 112/54  Pulse:  81  Resp:  18  Temp: 37.1 C   SpO2:  99%    Last Pain:  Vitals:   02/28/22 1035  TempSrc: Oral  PainSc:                  Lenard Simmer

## 2022-02-28 NOTE — Plan of Care (Signed)
?  Problem: Coping: ?Goal: Level of anxiety will decrease ?Outcome: Progressing ?  ?Problem: Safety: ?Goal: Ability to remain free from injury will improve ?Outcome: Progressing ?  ?

## 2022-02-28 NOTE — Progress Notes (Signed)
Physical Therapy Treatment Patient Details Name: Cassie Solis MRN: [id number] DOB: Jul 14, 1929 Today's Date: 02/28/2022   History of Present Illness Pt is a 88 y/o F admitted on 02/26/22. Pt with hx of fall 2/2 passing out at home on 02/21/22 resulting in L hip pain. Pt is s/p L ORIF on 01/07/22. Imaging showed a screw had shifted. Pt underwent sx on 02/26/22 for Left hip affixus removal with repeat fixation with Affixus. PMH: anemia, arthritis, CKD stage 3B, orthostatic hypotension, syncope, vertigo    PT Comments    Pt seen after receiving blood transfusion with pt received in recliner & agreeable to tx. Pt performs LLE LAQ with cuing for increased ROM. Pt performs STS x 2-3 times from recliner with cuing for foot placement & pt primarily weight bearing through RLE compared to LLE (pt also sits with R lateral lean to decrease weight bearing through L hip). Pt is able to progress to taking a few steps forwards & backwards with RW & min assist but pt keeps L heel slightly off of floor. Pt without c/o symptoms during session. Pt's son & daughter present reporting they are working on 24/7 supervision at home & would like to take pt home vs STR at [facility].   Pt noted to have bloody drainage from L hip & more LLE edema today - MD & nurse notified via secure chat. BP in LUE sitting after standing: 125/61 mmHg MAP 79, HR 87 bpm Standing: 97/60 mmHg MAP 71, HR 96 bpm    Recommendations for follow up therapy are one component of a multi-disciplinary discharge planning process, led by the attending physician.  Recommendations may be updated based on patient status, additional functional criteria and insurance authorization.  Follow Up Recommendations  Home health PT     Assistance Recommended at Discharge Frequent or constant Supervision/Assistance  Patient can return home with the following Assistance with cooking/housework;Help with stairs or ramp for entrance;Direct supervision/assist for medications  management;Assist for transportation;A lot of help with walking and/or transfers;A lot of help with bathing/dressing/bathroom   Equipment Recommendations  None recommended by PT    Recommendations for Other Services       Precautions / Restrictions Precautions Precautions: Fall Precaution Comments: Monitor orthostatics Restrictions Weight Bearing Restrictions: Yes LLE Weight Bearing: Weight bearing as tolerated     Mobility  Bed Mobility               General bed mobility comments: pt received sitting & left in recliner    Transfers Overall transfer level: Needs assistance Equipment used: Rolling walker (2 wheels) Transfers: Sit to/from Stand Sit to Stand: Min assist           General transfer comment: cuing for BLE placement, pt primarily weight bears through RLE during STS    Ambulation/Gait Ambulation/Gait assistance: Min assist Gait Distance (Feet):  (3 ft forwards + backwards) Assistive device: Rolling walker (2 wheels) Gait Pattern/deviations: Decreased step length - left, Decreased step length - right, Decreased stride length, Decreased weight shift to left Gait velocity: decreased         Stairs             Wheelchair Mobility    Modified Rankin (Stroke Patients Only)       Balance Overall balance assessment: Needs assistance         Standing balance support: Reliant on assistive device for balance, Bilateral upper extremity supported, During functional activity Standing balance-Leahy Scale: Poor  Cognition Arousal/Alertness: Awake/alert Behavior During Therapy: Flat affect Overall Cognitive Status: Within Functional Limits for tasks assessed                                          Exercises Total Joint Exercises Long Arc Quad: AROM, Strengthening, Left, 10 reps, Seated    General Comments        Pertinent Vitals/Pain Pain Assessment Pain Assessment:  Faces Faces Pain Scale: Hurts even more Pain Location: L hip with position changes, pt denies pain once in various positions sitting/standing Pain Descriptors / Indicators: Grimacing Pain Intervention(s): Monitored during session, Repositioned    Home Living                          Prior Function            PT Goals (current goals can now be found in the care plan section) Acute Rehab PT Goals Patient Stated Goal: return home PT Goal Formulation: With patient/family Time For Goal Achievement: 03/13/22 Potential to Achieve Goals: Good Progress towards PT goals: Progressing toward goals    Frequency    BID      PT Plan Current plan remains appropriate    Co-evaluation              AM-PAC PT "6 Clicks" Mobility   Outcome Measure  Help needed turning from your back to your side while in a flat bed without using bedrails?: A Little Help needed moving from lying on your back to sitting on the side of a flat bed without using bedrails?: A Lot Help needed moving to and from a bed to a chair (including a wheelchair)?: A Lot Help needed standing up from a chair using your arms (e.g., wheelchair or bedside chair)?: A Little Help needed to walk in hospital room?: A Lot Help needed climbing 3-5 steps with a railing? : Total 6 Click Score: 13    End of Session Equipment Utilized During Treatment: Gait belt Activity Tolerance: Patient tolerated treatment well;Patient limited by fatigue Patient left: in chair;with chair alarm set;with call bell/phone within reach;with nursing/sitter in room;with family/visitor present Nurse Communication: Mobility status (notified MD of LLE edema & notified nurse of bloody drainage from L hip) PT Visit Diagnosis: Unsteadiness on feet (R26.81);Muscle weakness (generalized) (M62.81);Difficulty in walking, not elsewhere classified (R26.2);Pain;Other abnormalities of gait and mobility (R26.89) Pain - Right/Left: Left Pain - part of  body: Hip     Time: 3016-0109 PT Time Calculation (min) (ACUTE ONLY): 18 min  Charges:  $Therapeutic Activity: 8-22 mins                     Aleda Grana, PT, DPT 02/28/22, 2:56 PM   Sandi Mariscal 02/28/2022, 2:54 PM

## 2022-02-28 NOTE — Progress Notes (Signed)
Anticoagulation monitoring(Lovenox):  88 yo  F ordered Lovenox 40 mg Q24h    Filed Weights   02/26/22 1218  Weight: 45.4 kg (100 lb)   BMI 17   Lab Results  Component Value Date   CREATININE 0.95 02/27/2022   CREATININE 0.86 02/26/2022   CREATININE 0.94 01/11/2022   Estimated Creatinine Clearance: 28.2 mL/min (by C-G formula based on SCr of 0.95 mg/dL). Hemoglobin & Hematocrit     Component Value Date/Time   HGB 6.9 (L) 02/28/2022 0430   HCT 21.0 (L) 02/28/2022 0430     Per Protocol for Patient with estCrcl < 30 ml/min, will transition to Lovenox 30 mg Q24h       Bari Mantis PharmD Clinical Pharmacist 02/28/2022

## 2022-03-01 LAB — CBC
HCT: 19.5 % — ABNORMAL LOW (ref 36.0–46.0)
Hemoglobin: 6.4 g/dL — ABNORMAL LOW (ref 12.0–15.0)
MCH: 32.8 pg (ref 26.0–34.0)
MCHC: 32.8 g/dL (ref 30.0–36.0)
MCV: 100 fL (ref 80.0–100.0)
Platelets: 127 10*3/uL — ABNORMAL LOW (ref 150–400)
RBC: 1.95 MIL/uL — ABNORMAL LOW (ref 3.87–5.11)
RDW: 17.4 % — ABNORMAL HIGH (ref 11.5–15.5)
WBC: 10.3 10*3/uL (ref 4.0–10.5)
nRBC: 0 % (ref 0.0–0.2)

## 2022-03-01 LAB — PREPARE RBC (CROSSMATCH)

## 2022-03-01 LAB — HEMOGLOBIN AND HEMATOCRIT, BLOOD
HCT: 22.3 % — ABNORMAL LOW (ref 36.0–46.0)
Hemoglobin: 7.7 g/dL — ABNORMAL LOW (ref 12.0–15.0)

## 2022-03-01 LAB — HEMOGLOBIN: Hemoglobin: 8.4 g/dL — ABNORMAL LOW (ref 12.0–15.0)

## 2022-03-01 MED ORDER — SODIUM CHLORIDE 0.9% IV SOLUTION: Freq: Once | INTRAVENOUS | Status: DC

## 2022-03-01 NOTE — Progress Notes (Signed)
Physical Therapy Treatment Patient Details Name: Cassie Solis MRN: [id number] DOB: 18-Mar-1929 Today's Date: 03/01/2022   History of Present Illness Pt is a 88 y/o F admitted on 02/26/22. Pt with hx of fall 2/2 passing out at home on 02/21/22 resulting in L hip pain. Pt is s/p L ORIF on 01/07/22. Imaging showed a screw had shifted. Pt underwent sx on 02/26/22 for Left hip affixus removal with repeat fixation with Affixus. PMH: anemia, arthritis, CKD stage 3B, orthostatic hypotension, syncope, vertigo    PT Comments    Pt seen for PT tx with pt agreeable. Pt reports she'd like to try to use the bathroom. Pt requires assistance to attempt supine>sit with use of hospital bed features but upon sitting EOB & prior to scooting to get feet on floor pt appears pale & reports feeling dizzy so assisted pt to supine. Pt requires min/mod assist for rolling L<>R to allow PT to place clean pad under her.   Pt also noted to have blood draining from LLE wound vac with nurse in room to address prior to mobility.  Nurse entered room at end of session reporting pt's new Hgb reading of 6.4 but this PT only aware of 7.3 prior to PT tx.  BP 125/56 mmHg MAP 74, HR 85 supine in bed   Recommendations for follow up therapy are one component of a multi-disciplinary discharge planning process, led by the attending physician.  Recommendations may be updated based on patient status, additional functional criteria and insurance authorization.  Follow Up Recommendations  Home health PT     Assistance Recommended at Discharge Frequent or constant Supervision/Assistance  Patient can return home with the following Assistance with cooking/housework;Help with stairs or ramp for entrance;Direct supervision/assist for medications management;Assist for transportation;A lot of help with walking and/or transfers;A lot of help with bathing/dressing/bathroom   Equipment Recommendations  None recommended by PT    Recommendations for  Other Services       Precautions / Restrictions Precautions Precautions: Fall Precaution Comments: Monitor orthostatics Restrictions Weight Bearing Restrictions: No LLE Weight Bearing: Weight bearing as tolerated     Mobility  Bed Mobility                    Transfers                        Ambulation/Gait                   Stairs             Wheelchair Mobility    Modified Rankin (Stroke Patients Only)       Balance                                            Cognition Arousal/Alertness: Awake/alert Behavior During Therapy: Flat affect Overall Cognitive Status: Within Functional Limits for tasks assessed                                 General Comments: sweet lady, tearful at times re: situation        Exercises      General Comments        Pertinent Vitals/Pain Pain Assessment Pain Assessment: Faces Faces Pain Scale: Hurts little more Pain Location: L hip  with movement Pain Descriptors / Indicators: Grimacing, Discomfort Pain Intervention(s): Monitored during session, Repositioned    Home Living                          Prior Function            PT Goals (current goals can now be found in the care plan section) Acute Rehab PT Goals Patient Stated Goal: return home PT Goal Formulation: With patient/family Time For Goal Achievement: 03/13/22 Potential to Achieve Goals: Fair Progress towards PT goals: Progressing toward goals    Frequency    BID      PT Plan Current plan remains appropriate    Co-evaluation              AM-PAC PT "6 Clicks" Mobility   Outcome Measure  Help needed turning from your back to your side while in a flat bed without using bedrails?: A Little Help needed moving from lying on your back to sitting on the side of a flat bed without using bedrails?: A Lot Help needed moving to and from a bed to a chair (including a  wheelchair)?: A Lot Help needed standing up from a chair using your arms (e.g., wheelchair or bedside chair)?: A Lot Help needed to walk in hospital room?: A Lot Help needed climbing 3-5 steps with a railing? : Total 6 Click Score: 12    End of Session   Activity Tolerance: Treatment limited secondary to medical complications (Comment) Patient left: in bed;with call bell/phone within reach;with bed alarm set Nurse Communication: Mobility status PT Visit Diagnosis: Unsteadiness on feet (R26.81);Muscle weakness (generalized) (M62.81);Difficulty in walking, not elsewhere classified (R26.2);Pain;Other abnormalities of gait and mobility (R26.89) Pain - Right/Left: Left Pain - part of body: Hip     Time: 9833-8250 PT Time Calculation (min) (ACUTE ONLY): 23 min  Charges:  $Therapeutic Activity: 23-37 mins                     Cassie Solis, PT, DPT 03/01/22, 12:43 PM   Cassie Solis 03/01/2022, 12:41 PM

## 2022-03-01 NOTE — Progress Notes (Signed)
Patient dressing saturated with blood and moderate amount leaked onto bed sheet. 4 new ABD pads applied as well as ACE wrap. MD on call notified.

## 2022-03-01 NOTE — Care Management Important Message (Signed)
Important Message  Patient Details  Name: LUCAS EXLINE MRN: [id number] Date of Birth: 12-09-1928   Medicare Important Message Given:  N/A - LOS <3 / Initial given by admissions     Olegario Messier A Knoah Nedeau 03/01/2022, 2:41 PM

## 2022-03-01 NOTE — Progress Notes (Signed)
Physical Therapy Treatment Patient Details Name: Cassie Solis MRN: [id number] DOB: 03/03/1929 Today's Date: 03/01/2022   History of Present Illness Pt is a 88 y/o F admitted on 02/26/22. Pt with hx of fall 2/2 passing out at home on 02/21/22 resulting in L hip pain. Pt is s/p L ORIF on 01/07/22. Imaging showed a screw had shifted. Pt underwent sx on 02/26/22 for Left hip affixus removal with repeat fixation with Affixus. PMH: anemia, arthritis, CKD stage 3B, orthostatic hypotension, syncope, vertigo    PT Comments    MD cleared pt for participation in setting of low Hgb & nurse cleared pt, reporting pt was currently receiving blood transfusion (initiated ~1-1.5 hours prior). Pt received in bed & agreeable to tx but very visibly discouraged re: current situation with PT providing encouragement/support. Pt engages in minimal LLE strengthening exercises with cuing for technique. Will continue to follow pt acutely to address strengthening, balance, transfers & gait with LRAD.    Recommendations for follow up therapy are one component of a multi-disciplinary discharge planning process, led by the attending physician.  Recommendations may be updated based on patient status, additional functional criteria and insurance authorization.  Follow Up Recommendations  Home health PT     Assistance Recommended at Discharge Frequent or constant Supervision/Assistance  Patient can return home with the following Assistance with cooking/housework;Help with stairs or ramp for entrance;Direct supervision/assist for medications management;Assist for transportation;A lot of help with walking and/or transfers;A lot of help with bathing/dressing/bathroom   Equipment Recommendations  None recommended by PT    Recommendations for Other Services       Precautions / Restrictions Precautions Precautions: Fall Precaution Comments: Monitor orthostatics Restrictions Weight Bearing Restrictions: Yes LLE Weight Bearing:  Weight bearing as tolerated     Mobility  Bed Mobility                    Transfers                        Ambulation/Gait                   Stairs             Wheelchair Mobility    Modified Rankin (Stroke Patients Only)       Balance                                            Cognition Arousal/Alertness: Awake/alert Behavior During Therapy: Flat affect Overall Cognitive Status: Within Functional Limits for tasks assessed                                 General Comments: sweet lady, tearful at times re: situation        Exercises Total Joint Exercises Ankle Circles/Pumps: AROM, Left, 10 reps, Supine Quad Sets: AROM, Strengthening, Left, 10 reps, Supine (tactile/verbal cuing for technique) Heel Slides: AAROM, Strengthening, Left, 10 reps, Supine Hip ABduction/ADduction: AAROM, Strengthening, Left, 10 reps, Supine (hip abduction slides)    General Comments        Pertinent Vitals/Pain Pain Assessment Pain Assessment: Faces Faces Pain Scale: Hurts little more Pain Location: L hip with movement Pain Descriptors / Indicators: Grimacing, Discomfort Pain Intervention(s): Repositioned, Limited activity within patient's tolerance    Home  Living                          Prior Function            PT Goals (current goals can now be found in the care plan section) Acute Rehab PT Goals Patient Stated Goal: get better, return home PT Goal Formulation: With patient/family Time For Goal Achievement: 03/13/22 Potential to Achieve Goals: Fair Progress towards PT goals: Progressing toward goals    Frequency    BID      PT Plan Current plan remains appropriate    Co-evaluation              AM-PAC PT "6 Clicks" Mobility   Outcome Measure  Help needed turning from your back to your side while in a flat bed without using bedrails?: A Little Help needed moving from lying on  your back to sitting on the side of a flat bed without using bedrails?: A Lot Help needed moving to and from a bed to a chair (including a wheelchair)?: A Lot Help needed standing up from a chair using your arms (e.g., wheelchair or bedside chair)?: A Lot Help needed to walk in hospital room?: A Lot Help needed climbing 3-5 steps with a railing? : Total 6 Click Score: 12    End of Session   Activity Tolerance: Treatment limited secondary to medical complications (Comment);Patient limited by fatigue Patient left: in bed;with call bell/phone within reach;with bed alarm set;with family/visitor present Nurse Communication: Mobility status PT Visit Diagnosis: Unsteadiness on feet (R26.81);Muscle weakness (generalized) (M62.81);Difficulty in walking, not elsewhere classified (R26.2);Pain;Other abnormalities of gait and mobility (R26.89) Pain - Right/Left: Left Pain - part of body: Hip     Time: 9518-8416 PT Time Calculation (min) (ACUTE ONLY): 8 min  Charges:  $Therapeutic Exercise: 8-22 mins $Therapeutic Activity: 23-37 mins                     Aleda Grana, PT, DPT 03/01/22, 4:05 PM   Sandi Mariscal 03/01/2022, 4:03 PM

## 2022-03-01 NOTE — Progress Notes (Signed)
  Subjective: 3 Days Post-Op Procedure(s) (LRB): Left hip affixus removal with repeat fixation with Affixus (Left) Patient reports pain as mild to moderate.   Patient is well, and has had no acute complaints or problems Plan is to go home versus Rehab after hospital stay. Negative for chest pain and shortness of breath Fever: no Gastrointestinal: Negative for nausea and vomiting  Objective: Vital signs in last 24 hours: Temp:  [97.8 F (36.6 C)-99.1 F (37.3 C)] 98.3 F (36.8 C) (07/03 1003) Pulse Rate:  [79-93] 79 (07/03 1003) Resp:  [16-24] 16 (07/03 1003) BP: (97-134)/(47-66) 97/47 (07/03 1003) SpO2:  [94 %-99 %] 99 % (07/03 1003)  Intake/Output from previous day:  Intake/Output Summary (Last 24 hours) at 03/01/2022 1041 Last data filed at 03/01/2022 0308 Gross per 24 hour  Intake 1006 ml  Output 350 ml  Net 656 ml    Intake/Output this shift: No intake/output data recorded.  Labs: Recent Labs    02/26/22 1747 02/27/22 0430 02/28/22 0430 02/28/22 1958 03/01/22 0841  HGB 10.1* 8.5* 6.9* 7.3* 6.4*   Recent Labs    02/28/22 0430 02/28/22 1958 03/01/22 0841  WBC 11.9*  --  10.3  RBC 2.00*  --  1.95*  HCT 21.0* 22.1* 19.5*  PLT 155  --  127*   Recent Labs    02/26/22 1747 02/27/22 0430  NA  --  137  K  --  4.1  CL  --  105  CO2  --  25  BUN  --  28*  CREATININE 0.86 0.95  GLUCOSE  --  105*  CALCIUM  --  8.1*   No results for input(s): "LABPT", "INR" in the last 72 hours.   EXAM General - Patient is Alert and Oriented Extremity - Neurovascular intact Sensation intact distally Dorsiflexion/Plantar flexion intact Compartment soft Dressing/Incision -wound VAC suctioning.  Dr. Rosita Kea applied this this morning. Motor Function - intact, moving foot and toes well on exam.   Past Medical History:  Diagnosis Date   Adnexal mass    Anemia    Arthritis    left wrist, bilateral hips   Chronic kidney disease    Stage 3b   Lactic acidosis    Macular  degeneration    Orthostatic hypotension    Osteoporosis    Seasonal allergies    Sinus bradycardia    Syncope 02/21/2022   Vertigo    rare    Assessment/Plan: 3 Days Post-Op Procedure(s) (LRB): Left hip affixus removal with repeat fixation with Affixus (Left) Principal Problem:   S/P ORIF (open reduction internal fixation) fracture  Estimated body mass index is 17.16 kg/m as calculated from the following:   Height as of this encounter: 5\' 4"  (1.626 m).   Weight as of this encounter: 45.4 kg. Advance diet Up with therapy  Acute blood loss anemia.  Postop surgical.  Hemoglobin 6.4 after 1 unit transfused yesterday..  Transfuse 1 more unit packed red blood.  Recheck labs.  Discharge planning to go home with home health physical therapy, possible Monday.  DVT Prophylaxis - Lovenox, Foot Pumps, and TED hose Weight-Bearing as tolerated to left leg  Monday, PA-C Orthopaedic Surgery 03/01/2022, 10:41 AM

## 2022-03-02 LAB — HEMOGLOBIN AND HEMATOCRIT, BLOOD
HCT: 22.9 % — ABNORMAL LOW (ref 36.0–46.0)
Hemoglobin: 7.8 g/dL — ABNORMAL LOW (ref 12.0–15.0)

## 2022-03-02 MED ORDER — BISACODYL 10 MG RE SUPP: 10.0000 mg | Freq: Once | RECTAL | Status: DC

## 2022-03-02 NOTE — Progress Notes (Signed)
Physical Therapy Treatment Patient Details Name: Cassie Solis MRN: [id number] DOB: 06-Aug-1929 Today's Date: 03/02/2022   History of Present Illness Pt is a 88 y/o F admitted on 02/26/22. Pt with hx of fall 2/2 passing out at home on 02/21/22 resulting in L hip pain. Pt is s/p L ORIF on 01/07/22. Imaging showed a screw had shifted. Pt underwent sx on 02/26/22 for Left hip affixus removal with repeat fixation with Affixus. PMH: anemia, arthritis, CKD stage 3B, orthostatic hypotension, syncope, vertigo    PT Comments    S/P blood transfusion previous date; HgB currently 7.8  Vitals stable and WFL; no orthostasis, no subjective reports of dizziness/lightheadedness this date. Improved activity tolerance and overall mobility progression this date; completing sit/stand with RW, min assist and initiating gait (8') with Rw, min assist (chair follow for safety).  Very slow, deliberate 3-point gait pattern with limited WBing L LE; unable to achieve L foot flat in loading (LLD?).  No overt buckling or LOB, but does fatigue quickly with progressive activity. Patient very pleased and encouraged by progress this date.    Recommendations for follow up therapy are one component of a multi-disciplinary discharge planning process, led by the attending physician.  Recommendations may be updated based on patient status, additional functional criteria and insurance authorization.  Follow Up Recommendations  Skilled nursing-short term rehab (<3 hours/day) Can patient physically be transported by private vehicle: Yes   Assistance Recommended at Discharge Frequent or constant Supervision/Assistance  Patient can return home with the following Assistance with cooking/housework;Help with stairs or ramp for entrance;Direct supervision/assist for medications management;Assist for transportation;A lot of help with walking and/or transfers;A lot of help with bathing/dressing/bathroom   Equipment Recommendations  None  recommended by PT    Recommendations for Other Services       Precautions / Restrictions Precautions Precautions: Fall Precaution Comments: Monitor orthostatics Restrictions Weight Bearing Restrictions: Yes LLE Weight Bearing: Weight bearing as tolerated     Mobility  Bed Mobility               General bed mobility comments: seated in recliner beginning/end of treatment session    Transfers Overall transfer level: Needs assistance Equipment used: Rolling walker (2 wheels)   Sit to Stand: Independent           General transfer comment: cuing for hand placement, weight offset to R LE; heavy use of UEs to complete movement transition    Ambulation/Gait Ambulation/Gait assistance: Min assist, +2 safety/equipment Gait Distance (Feet): 8 Feet Assistive device: Rolling walker (2 wheels)         General Gait Details: 3-point, step to gait pattern, heavy WBing bilat UEs; unable to achieve L foot flat throughout gait cycle (suspected LLD?). Very slow and deliberate; fatigues quickly.   Stairs             Wheelchair Mobility    Modified Rankin (Stroke Patients Only)       Balance Overall balance assessment: Needs assistance Sitting-balance support: No upper extremity supported, Feet supported Sitting balance-Leahy Scale: Fair     Standing balance support: Bilateral upper extremity supported Standing balance-Leahy Scale: Fair                              Cognition Arousal/Alertness: Awake/alert Behavior During Therapy: WFL for tasks assessed/performed Overall Cognitive Status: Within Functional Limits for tasks assessed  General Comments: motivated, eager to "see what i can do today"        Exercises Other Exercises Other Exercises: Seated LE therex, 1x10, active ROM: ankle pumps, LAQs, L knee flexion.  Good L knee ROM with isolated therex Other Exercises: Sit/stand with RW x2, min  assist    General Comments        Pertinent Vitals/Pain Pain Assessment Faces Pain Scale: Hurts little more Pain Location: L hip with movement Pain Descriptors / Indicators: Grimacing, Discomfort Pain Intervention(s): Limited activity within patient's tolerance, Monitored during session, Repositioned (declined additional meds at this time)    Home Living                          Prior Function            PT Goals (current goals can now be found in the care plan section) Acute Rehab PT Goals Patient Stated Goal: get better, return home PT Goal Formulation: With patient/family Time For Goal Achievement: 03/13/22 Potential to Achieve Goals: Fair Progress towards PT goals: Progressing toward goals    Frequency    BID      PT Plan Current plan remains appropriate    Co-evaluation              AM-PAC PT "6 Clicks" Mobility   Outcome Measure  Help needed turning from your back to your side while in a flat bed without using bedrails?: A Little Help needed moving from lying on your back to sitting on the side of a flat bed without using bedrails?: A Lot Help needed moving to and from a bed to a chair (including a wheelchair)?: A Little Help needed standing up from a chair using your arms (e.g., wheelchair or bedside chair)?: A Little Help needed to walk in hospital room?: A Little Help needed climbing 3-5 steps with a railing? : Total 6 Click Score: 15    End of Session Equipment Utilized During Treatment: Gait belt Activity Tolerance: Patient tolerated treatment well Patient left: in chair;with call bell/phone within reach;with chair alarm set;with family/visitor present Nurse Communication: Mobility status PT Visit Diagnosis: Unsteadiness on feet (R26.81);Muscle weakness (generalized) (M62.81);Difficulty in walking, not elsewhere classified (R26.2);Pain;Other abnormalities of gait and mobility (R26.89) Pain - Right/Left: Left Pain - part of body:  Hip     Time: 0626-9485 PT Time Calculation (min) (ACUTE ONLY): 18 min  Charges:  $Gait Training: 8-22 mins                     Aldan Camey H. Manson Passey, PT, DPT, NCS 03/02/22, 11:43 AM [phone]

## 2022-03-02 NOTE — Progress Notes (Signed)
  Subjective: 4 Days Post-Op Procedure(s) (LRB): Left hip affixus removal with repeat fixation with Affixus (Left) Patient reports pain as well-controlled.  Family at bedside.  Patient is well, and has had no acute complaints or problems. Feels much improved from yesterday.  Plan is to go Home vs SNF after hospital stay. Negative for chest pain and shortness of breath Fever: no Gastrointestinal: negative for nausea and vomiting.    Objective: Vital signs in last 24 hours: Temp:  [97.8 F (36.6 C)-99.5 F (37.5 C)] 97.8 F (36.6 C) (07/04 0440) Pulse Rate:  [80-90] 80 (07/04 0440) Resp:  [16] 16 (07/04 0440) BP: (109-134)/(46-72) 131/72 (07/04 0440) SpO2:  [95 %-98 %] 95 % (07/04 0440)  Intake/Output from previous day:  Intake/Output Summary (Last 24 hours) at 03/02/2022 1006 Last data filed at 03/01/2022 1855 Gross per 24 hour  Intake 480 ml  Output 300 ml  Net 180 ml    Intake/Output this shift: No intake/output data recorded.  Labs: Recent Labs    02/28/22 1958 03/01/22 0841 03/01/22 1846 03/01/22 2127 03/02/22 0218  HGB 7.3* 6.4* 7.7* 8.4* 7.8*   Recent Labs    02/28/22 0430 02/28/22 1958 03/01/22 0841 03/01/22 1846 03/02/22 0218  WBC 11.9*  --  10.3  --   --   RBC 2.00*  --  1.95*  --   --   HCT 21.0*   < > 19.5* 22.3* 22.9*  PLT 155  --  127*  --   --    < > = values in this interval not displayed.   No results for input(s): "NA", "K", "CL", "CO2", "BUN", "CREATININE", "GLUCOSE", "CALCIUM" in the last 72 hours. No results for input(s): "LABPT", "INR" in the last 72 hours.   EXAM General - Patient is Alert, Appropriate, and Oriented Extremity - Neurovascular intact Dorsiflexion/Plantar flexion intact Compartment soft Dressing/Incision -Prevena intact, small amount of blood pooling at distal end of proximal prevena, but enclosed within dressing, ~25 cc drainage in cannister  Motor Function - intact, moving foot and toes well on exam.      Assessment/Plan: 4 Days Post-Op Procedure(s) (LRB): Left hip affixus removal with repeat fixation with Affixus (Left) Principal Problem:   S/P ORIF (open reduction internal fixation) fracture  Estimated body mass index is 17.16 kg/m as calculated from the following:   Height as of this encounter: 5\' 4"  (1.626 m).   Weight as of this encounter: 45.4 kg. Advance diet Up with therapy  Hg 7.8 this AM. Patient to proceed with therapy, will recheck Hg tomorrow.   Goal for discharge to home pending adequate progress with PT.   DVT Prophylaxis - Lovenox, Ted hose, and SCDs Weight-Bearing as tolerated to left leg  , PA-C [facility] 03/02/2022, 10:06 AM

## 2022-03-02 NOTE — Plan of Care (Signed)

## 2022-03-02 NOTE — TOC Progression Note (Signed)
Transition of Care [facility]) - Progression Note    Patient Details  Name: AZELIA REIGER MRN: [id number] Date of Birth: 1929/03/13  Transition of Care [facility]) CM/SW Contact  Margarito Liner, LCSW Phone Number: 03/02/2022, 3:52 PM  Clinical Narrative:   PT now recommending SNF. Discussed with patient and daughter at bedside. They would prefer for her to return home with home health at discharge but will discuss SNF. They have arranged for three people to rotate staying with her. Updated [facility] representative this morning about prevena wound vac.  Expected Discharge Plan and Services                                                 Social Determinants of Health (SDOH) Interventions    Readmission Risk Interventions    01/08/2022    1:46 PM  Readmission Risk Prevention Plan  Transportation Screening Complete  PCP or Specialist Appt within 5-7 Days Complete  Home Care Screening Complete  Medication Review (RN CM) Complete

## 2022-03-03 ENCOUNTER — Encounter: Payer: Self-pay | Admitting: Orthopedic Surgery

## 2022-03-03 LAB — PREPARE RBC (CROSSMATCH)

## 2022-03-03 LAB — HEMOGLOBIN AND HEMATOCRIT, BLOOD
HCT: 20 % — ABNORMAL LOW (ref 36.0–46.0)
Hemoglobin: 6.8 g/dL — ABNORMAL LOW (ref 12.0–15.0)

## 2022-03-03 LAB — HEMOGLOBIN: Hemoglobin: 8.1 g/dL — ABNORMAL LOW (ref 12.0–15.0)

## 2022-03-03 MED ORDER — SODIUM CHLORIDE 0.9% IV SOLUTION: Freq: Once | INTRAVENOUS | Status: AC

## 2022-03-03 NOTE — Progress Notes (Signed)
Occupational Therapy Treatment Patient Details Name: Cassie Solis MRN: [id number] DOB: 1929/02/21 Today's Date: 03/03/2022   History of present illness Pt is a 88 y/o F admitted on 02/26/22. Pt with hx of fall 2/2 passing out at home on 02/21/22 resulting in L hip pain. Pt is s/p L ORIF on 01/07/22. Imaging showed a screw had shifted. Pt underwent sx on 02/26/22 for Left hip affixus removal with repeat fixation with Affixus. PMH: anemia, arthritis, CKD stage 3B, orthostatic hypotension, syncope, vertigo   OT comments  Upon entering the room, pt supine in bed and reports feeling better. She is getting blood transfusion at this time with RN okay with therapist assisting pt with bed level exercise for safety. OT also noted wound vac to be leaking and called RN to room to assess further during session. OT provided pt with level 2 theraband and pt performing 2 sets of 10 chest pulls, shoulder diagonals, shoulder elevation, and alternating punches with min cuing for proper technique. Pt was tearful at times because she want's to go home but knows she is unable to at this time. She is pleasant and cooperative throughout. Theraband left in pt's room for her to utilize on her own for B UE strengthening. OT continues to recommend SNF at discharge to address functional deficits before returning home.    Recommendations for follow up therapy are one component of a multi-disciplinary discharge planning process, led by the attending physician.  Recommendations may be updated based on patient status, additional functional criteria and insurance authorization.    Follow Up Recommendations  Skilled nursing-short term rehab (<3 hours/day)    Assistance Recommended at Discharge Frequent or constant Supervision/Assistance  Patient can return home with the following  A little help with walking and/or transfers;A little help with bathing/dressing/bathroom;Assist for transportation;Help with stairs or ramp for entrance    Equipment Recommendations  Other (comment) (defer to next venue of care)       Precautions / Restrictions Precautions Precautions: Fall Precaution Comments: Monitor orthostatics Restrictions Weight Bearing Restrictions: Yes LLE Weight Bearing: Weight bearing as tolerated       Mobility Bed Mobility               General bed mobility comments: remained in bed secondary to wound vac leaking and getting transfusion               ADL either performed or assessed with clinical judgement    Extremity/Trunk Assessment Upper Extremity Assessment Upper Extremity Assessment: Generalized weakness   Lower Extremity Assessment Lower Extremity Assessment: Generalized weakness LLE Deficits / Details: s/p L hip sx. pain limited.        Vision Baseline Vision/History: 1 Wears glasses Ability to See in Adequate Light: 0 Adequate Patient Visual Report: No change from baseline            Cognition Arousal/Alertness: Awake/alert Behavior During Therapy: WFL for tasks assessed/performed Overall Cognitive Status: Within Functional Limits for tasks assessed                                 General Comments: Pt is very pleasant and coopertive but also tearful about situation                   Pertinent Vitals/ Pain       Pain Assessment Pain Assessment: Faces Faces Pain Scale: Hurts little more Pain Location: L hip with movement Pain Descriptors /  Indicators: Grimacing, Discomfort Pain Intervention(s): Monitored during session, Repositioned         Frequency  Min 2X/week        Progress Toward Goals  OT Goals(current goals can now be found in the care plan section)  Progress towards OT goals: Progressing toward goals  Acute Rehab OT Goals Patient Stated Goal: to go home OT Goal Formulation: With patient/family Time For Goal Achievement: 03/13/22 Potential to Achieve Goals: Good  Plan Frequency remains appropriate;Discharge plan needs  to be updated       AM-PAC OT "6 Clicks" Daily Activity     Outcome Measure   Help from another person eating meals?: None Help from another person taking care of personal grooming?: A Little Help from another person toileting, which includes using toliet, bedpan, or urinal?: A Lot Help from another person bathing (including washing, rinsing, drying)?: A Little Help from another person to put on and taking off regular upper body clothing?: A Little Help from another person to put on and taking off regular lower body clothing?: A Lot 6 Click Score: 17    End of Session    OT Visit Diagnosis: Other abnormalities of gait and mobility (R26.89);Muscle weakness (generalized) (M62.81);Pain Pain - Right/Left: Left Pain - part of body: Hip   Activity Tolerance Patient tolerated treatment well;Treatment limited secondary to medical complications (Comment)   Patient Left in bed;with call bell/phone within reach;with bed alarm set   Nurse Communication Mobility status;Other (comment) (wound vac leaking)        Time: 2197-5883 OT Time Calculation (min): 27 min  Charges: OT General Charges $OT Visit: 1 Visit OT Treatments $Therapeutic Exercise: 23-37 mins  Jackquline Denmark, MS, OTR/L , CBIS ascom [phone]  03/03/22, 12:24 PM

## 2022-03-03 NOTE — Progress Notes (Signed)
PT Cancellation Note  Patient Details Name: Cassie Solis MRN: [id number] DOB: 25-Apr-1929   Cancelled Treatment:     Order received for pt to be administered blood transfusion for low Hgb.PT will return after blood transfusion. RN to inform author when blood is finished. Acute PT will continue to follow and progress per current POC.    Rushie Chestnut 03/03/2022, 7:47 AM

## 2022-03-03 NOTE — Progress Notes (Signed)
  Subjective: 5 Days Post-Op Procedure(s) (LRB): Left hip affixus removal with repeat fixation with Affixus (Left) Patient reports pain as well-controlled.  Occasional muscle spasms. Patient is well, and has had no acute complaints or problems. Feels much improved from yesterday.  Plan is to go Home vs SNF after hospital stay. Negative for chest pain and shortness of breath Fever: no Gastrointestinal: negative for nausea and vomiting.    Objective: Vital signs in last 24 hours: Temp:  [98.2 F (36.8 C)-99.3 F (37.4 C)] 98.8 F (37.1 C) (07/05 0412) Pulse Rate:  [81-91] 81 (07/05 0412) Resp:  [15-16] 16 (07/05 0412) BP: (106-129)/(55-67) 124/67 (07/05 0412) SpO2:  [95 %-100 %] 95 % (07/05 0412)  Intake/Output from previous day: No intake or output data in the 24 hours ending 03/03/22 0719   Intake/Output this shift: No intake/output data recorded.  Labs: Recent Labs    03/01/22 0841 03/01/22 1846 03/01/22 2127 03/02/22 0218 03/03/22 0450  HGB 6.4* 7.7* 8.4* 7.8* 6.8*   Recent Labs    03/01/22 0841 03/01/22 1846 03/02/22 0218 03/03/22 0450  WBC 10.3  --   --   --   RBC 1.95*  --   --   --   HCT 19.5*   < > 22.9* 20.0*  PLT 127*  --   --   --    < > = values in this interval not displayed.   No results for input(s): "NA", "K", "CL", "CO2", "BUN", "CREATININE", "GLUCOSE", "CALCIUM" in the last 72 hours. No results for input(s): "LABPT", "INR" in the last 72 hours.   EXAM General - Patient is Alert, Appropriate, and Oriented Extremity - Neurovascular intact Dorsiflexion/Plantar flexion intact Compartment soft Dressing/Incision -Prevena draining posteriorly distally, amount of blood pooling at distal end of proximal prevena, so new Tegaderm dressing added. Motor Function - intact, moving foot and toes well on exam.     Assessment/Plan: 5 Days Post-Op Procedure(s) (LRB): Left hip affixus removal with repeat fixation with Affixus (Left) Principal Problem:    S/P ORIF (open reduction internal fixation) fracture  Estimated body mass index is 17.16 kg/m as calculated from the following:   Height as of this encounter: 5\' 4"  (1.626 m).   Weight as of this encounter: 45.4 kg. Advance diet Up with therapy  Hg 6.8 this AM after 2 units of transfused blood.  1 unit transfused blood ordered.  Distal wound VAC continues to allow drainage.  New wound VAC Tegaderm material was added to reinforce at better control bleeding. Patient to proceed with therapy, will recheck Hg tomorrow.   Goal for discharge to home pending adequate progress with PT.   DVT Prophylaxis - Lovenox, Ted hose, and SCDs Weight-Bearing as tolerated to left leg  PA-C [facility] 03/03/2022, 7:19 AM

## 2022-03-03 NOTE — Progress Notes (Signed)
Noted blood leaking and pooling on patient's wound vac on her knee. big lump of blood clot was pulled gently, the size of a regular palm, jelly like consistency. Site still have some leaking blood and secured it with tagaderm. Notified attending Ortho MD via pager.

## 2022-03-03 NOTE — Progress Notes (Signed)
Physical Therapy Treatment Patient Details Name: Cassie Solis MRN: [id number] DOB: [birth year] Today's Date: 03/03/2022   History of Present Illness Pt is a 88 y/o F admitted on 02/26/22. Pt with hx of fall 2/2 passing out at home on 02/21/22 resulting in L hip pain. Pt is s/p L ORIF on 01/07/22. Imaging showed a screw had shifted. Pt underwent sx on 02/26/22 for Left hip affixus removal with repeat fixation with Affixus. PMH: anemia, arthritis, CKD stage 3B, orthostatic hypotension, syncope, vertigo    PT Comments    Author returned after blood transfusion completed and post Hgb reading > 8. Pt was A and O x 4 and agreeable to session. She does have some anxiety with getting OOB/fear of falling. Pt was able to exit R side of bed, stand to RW, and take a few steps to recliner. Distance limited by pt's active bleeding from L lower dressing/wound vac site. RN/MD made aware. Author recommends DC to SNF to address deficits while maximizing independence with ADLs.    Recommendations for follow up therapy are one component of a multi-disciplinary discharge planning process, led by the attending physician.  Recommendations may be updated based on patient status, additional functional criteria and insurance authorization.  Follow Up Recommendations  Skilled nursing-short term rehab (<3 hours/day) Can patient physically be transported by private vehicle: Yes   Assistance Recommended at Discharge Frequent or constant Supervision/Assistance  Patient can return home with the following Assistance with cooking/housework;Help with stairs or ramp for entrance;Direct supervision/assist for medications management;Assist for transportation;A lot of help with walking and/or transfers;A lot of help with bathing/dressing/bathroom   Equipment Recommendations   (defer to next level of care)       Precautions / Restrictions Precautions Precautions: Fall Precaution Comments: Monitor orthostatics Restrictions Weight  Bearing Restrictions: Yes LLE Weight Bearing: Weight bearing as tolerated     Mobility  Bed Mobility Overal bed mobility: Needs Assistance Bed Mobility: Supine to Sit  Supine to sit: Min assist, HOB elevated  General bed mobility comments: Increased time to perform with min assist to achieve EOB short sit    Transfers Overall transfer level: Needs assistance Equipment used: Rolling walker (2 wheels) Transfers: Sit to/from Stand Sit to Stand: Min assist  Step pivot transfers: Min assist    General transfer comment: Pt was able to stand EOB with min assist of one. Took a few antalgic steps to recliner    Ambulation/Gait Ambulation/Gait assistance: Editor, commissioning (Feet): 3 Feet Assistive device: Rolling walker (2 wheels) Gait Pattern/deviations: Step-to pattern Gait velocity: decreased     General Gait Details: limited distance due to active bleeding noted from lower dressing/ wound vac dressing site     Balance Overall balance assessment: Needs assistance Sitting-balance support: No upper extremity supported, Feet supported Sitting balance-Leahy Scale: Fair     Standing balance support: Bilateral upper extremity supported Standing balance-Leahy Scale: Fair        Cognition Arousal/Alertness: Awake/alert Behavior During Therapy: WFL for tasks assessed/performed Overall Cognitive Status: Within Functional Limits for tasks assessed      General Comments: Pt is very pleasant and coopertive but also tearful about situation               Pertinent Vitals/Pain Pain Assessment Pain Assessment: 0-10 Pain Score: 4  Faces Pain Scale: Hurts a little bit Pain Location: L hip with movement Pain Descriptors / Indicators: Grimacing, Discomfort Pain Intervention(s): Limited activity within patient's tolerance, Monitored during session, Premedicated before session, Repositioned  PT Goals (current goals can now be found in the care plan section) Acute Rehab  PT Goals Patient Stated Goal: get better, return home Progress towards PT goals: Progressing toward goals    Frequency    BID      PT Plan Current plan remains appropriate       AM-PAC PT "6 Clicks" Mobility   Outcome Measure  Help needed turning from your back to your side while in a flat bed without using bedrails?: A Little Help needed moving from lying on your back to sitting on the side of a flat bed without using bedrails?: A Little Help needed moving to and from a bed to a chair (including a wheelchair)?: A Little Help needed standing up from a chair using your arms (e.g., wheelchair or bedside chair)?: A Little Help needed to walk in hospital room?: A Lot Help needed climbing 3-5 steps with a railing? : Total 6 Click Score: 15    End of Session   Activity Tolerance: Patient tolerated treatment well Patient left: in chair;with call bell/phone within reach;with chair alarm set;with family/visitor present Nurse Communication: Mobility status PT Visit Diagnosis: Unsteadiness on feet (R26.81);Muscle weakness (generalized) (M62.81);Difficulty in walking, not elsewhere classified (R26.2);Pain;Other abnormalities of gait and mobility (R26.89) Pain - Right/Left: Left Pain - part of body: Hip     Time: 9767-3419 PT Time Calculation (min) (ACUTE ONLY): 18 min  Charges:  $Therapeutic Activity: 8-22 mins                     Jetta Lout PTA 03/03/22, 3:49 PM

## 2022-03-03 NOTE — Plan of Care (Signed)

## 2022-03-03 NOTE — Progress Notes (Signed)
Physical Therapy Treatment Patient Details Name: Cassie Solis MRN: [id number] DOB: 1928/09/03 Today's Date: 03/03/2022   History of Present Illness Pt is a 88 y/o F admitted on 02/26/22. Pt with hx of fall 2/2 passing out at home on 02/21/22 resulting in L hip pain. Pt is s/p L ORIF on 01/07/22. Imaging showed a screw had shifted. Pt underwent sx on 02/26/22 for Left hip affixus removal with repeat fixation with Affixus. PMH: anemia, arthritis, CKD stage 3B, orthostatic hypotension, syncope, vertigo    PT Comments    Pt was still sitting in recliner upon arriving from previous session. Wound vac dressing was changed with much less drainage noted. She was able to stand and ambulate with very little (CGA) assistance. Distance only limited by pt." I will walk as far as you want tomorrow." Pt is progressing quickly and endorsing feeling much better now than she has in past few days. BOP was stable throughout session without symptoms of dizziness or orthostatic hypotension. She tolerated performance of ther ex well. Lengthy discussion with pt/pt's daughter about DC recs. They have had bad experience in past at rehab and are hopeful she will improve enough to safely DC home.    Recommendations for follow up therapy are one component of a multi-disciplinary discharge planning process, led by the attending physician.  Recommendations may be updated based on patient status, additional functional criteria and insurance authorization.  Follow Up Recommendations  Skilled nursing-short term rehab (<3 hours/day) (pt is progressing quickly and per family has 24/7 care available at home. bad experience in past at rehab and hopeful to DC home when deemed medically ready. will plan to treat pt BID tomorrow in hope DC recs can be upgraded to home with HHPT) Can patient physically be transported by private vehicle: Yes   Assistance Recommended at Discharge Intermittent Supervision/Assistance  Patient can return home with  the following Assistance with cooking/housework;Assist for transportation;Help with stairs or ramp for entrance;A little help with bathing/dressing/bathroom;A little help with walking and/or transfers   Equipment Recommendations  Rolling walker (2 wheels);BSC/3in1 (ongoing assessment)       Precautions / Restrictions Precautions Precautions: Fall Precaution Comments: Monitor orthostatics Restrictions Weight Bearing Restrictions: Yes LLE Weight Bearing: Weight bearing as tolerated     Mobility  Bed Mobility Overal bed mobility: Needs Assistance Bed Mobility: Sit to Supine     Supine to sit: Min assist, HOB elevated Sit to supine: Min assist (flat bed surface)   General bed mobility comments: Pt was able to progress RLE into bed and only required min assist for LLE. family in room throughout session and state they are available 24/7 at DC    Transfers Overall transfer level: Needs assistance Equipment used: Rolling walker (2 wheels) Transfers: Sit to/from Stand Sit to Stand: Min guard   Step pivot transfers: Min assist       General transfer comment: pt was able to stand to RW this afternoon form recliner with CGA only. no physical lifting assistance required.    Ambulation/Gait Ambulation/Gait assistance: Min guard Gait Distance (Feet): 15 Feet Assistive device: Rolling walker (2 wheels) Gait Pattern/deviations: Step-to pattern, Narrow base of support Gait velocity: decreased     General Gait Details: Pt was easily able to ambulate ~ 15 fty in rm with RW. she requested not to go further distances but states" I will walk around the RN station tomorrow."    Balance Overall balance assessment: Needs assistance Sitting-balance support: No upper extremity supported, Feet supported Sitting  balance-Leahy Scale: Good Sitting balance - Comments: no balance deficits in sitting with feet supported on floor   Standing balance support: Bilateral upper extremity  supported Standing balance-Leahy Scale: Fair     Cognition Arousal/Alertness: Awake/alert Behavior During Therapy: WFL for tasks assessed/performed Overall Cognitive Status: Within Functional Limits for tasks assessed    General Comments: Pt much less anxious this afternoon versus earlier session. Pt agreeable and motivated to improve           General Comments General comments (skin integrity, edema, etc.): Author issued HEP and pt was able to perform with mostly just vcs.      Pertinent Vitals/Pain Pain Assessment Pain Assessment: 0-10 Pain Score: 4  Faces Pain Scale: Hurts a little bit Pain Location: L hip with movement Pain Descriptors / Indicators: Grimacing, Discomfort Pain Intervention(s): Limited activity within patient's tolerance, Monitored during session, Premedicated before session, Repositioned     PT Goals (current goals can now be found in the care plan section) Acute Rehab PT Goals Patient Stated Goal: get better, return home Progress towards PT goals: Progressing toward goals    Frequency    BID      PT Plan Current plan remains appropriate       AM-PAC PT "6 Clicks" Mobility   Outcome Measure  Help needed turning from your back to your side while in a flat bed without using bedrails?: A Little Help needed moving from lying on your back to sitting on the side of a flat bed without using bedrails?: A Little Help needed moving to and from a bed to a chair (including a wheelchair)?: A Little Help needed standing up from a chair using your arms (e.g., wheelchair or bedside chair)?: A Little Help needed to walk in hospital room?: A Little Help needed climbing 3-5 steps with a railing? : A Lot 6 Click Score: 17    End of Session   Activity Tolerance: Patient tolerated treatment well Patient left: in bed;with call bell/phone within reach;with bed alarm set;with family/visitor present;with SCD's reapplied Nurse Communication: Mobility status PT  Visit Diagnosis: Unsteadiness on feet (R26.81);Muscle weakness (generalized) (M62.81);Difficulty in walking, not elsewhere classified (R26.2);Pain;Other abnormalities of gait and mobility (R26.89) Pain - Right/Left: Left Pain - part of body: Hip     Time: 6546-5035 PT Time Calculation (min) (ACUTE ONLY): 40 min  Charges:  $Gait Training: 8-22 mins $Therapeutic Exercise: 8-22 mins $Therapeutic Activity: 8-22 mins                     Jetta Lout PTA 03/03/22, 4:59 PM

## 2022-03-04 ENCOUNTER — Encounter: Payer: Self-pay | Admitting: Orthopedic Surgery

## 2022-03-04 LAB — BPAM RBC
Blood Product Expiration Date: 202307082359
Blood Product Expiration Date: 202307272359
Blood Product Expiration Date: 202307292359
ISSUE DATE / TIME: 202307021028
ISSUE DATE / TIME: 202307031319
ISSUE DATE / TIME: 202307050842
Unit Type and Rh: 600
Unit Type and Rh: 6200
Unit Type and Rh: 6200

## 2022-03-04 LAB — TYPE AND SCREEN
ABO/RH(D): A POS
Antibody Screen: NEGATIVE
Unit division: 0
Unit division: 0
Unit division: 0

## 2022-03-04 LAB — HEMOGLOBIN AND HEMATOCRIT, BLOOD
HCT: 23.8 % — ABNORMAL LOW (ref 36.0–46.0)
Hemoglobin: 7.9 g/dL — ABNORMAL LOW (ref 12.0–15.0)

## 2022-03-04 LAB — HEMOGLOBIN: Hemoglobin: 8.3 g/dL — ABNORMAL LOW (ref 12.0–15.0)

## 2022-03-04 MED ORDER — METHOCARBAMOL 500 MG PO TABS: 500.0000 mg | ORAL_TABLET | Freq: Four times a day (QID) | ORAL | 0 refills | Status: DC | PRN

## 2022-03-04 NOTE — Discharge Summary (Addendum)
Physician Discharge Summary  Subjective: 6 Days Post-Op Procedure(s) (LRB): Left hip affixus removal with repeat fixation with Affixus (Left) Patient reports pain as mild.   Patient seen in rounds with Dr. Rosita Kea. Patient is well, and has had no acute complaints or problems Patient is ready to go home with home health physical therapy  Physician Discharge Summary  Patient ID: Cassie Solis MRN: [id number] DOB/AGE: [birth year] 88 y.o.  Admit date: 02/26/2022 Discharge date: 03/04/2022  Admission Diagnoses:  Discharge Diagnoses:  Principal Problem:   S/P ORIF (open reduction internal fixation) fracture   Discharged Condition: stable  Hospital Course: The patient is postop day 6 from a left hip affixes removal and repeat fixation done by Dr. Rosita Kea.  She has slowly improved since surgery.  She did have significant drainage and had a wound VAC applied around postop day 3 that did continue to drain slightly.  A new wound VAC was then reapplied on the distal portion yesterday was 6.8 and after 1 more unit of blood her hemoglobin increased to 7.8 this morning.  The patient has been able to ambulate 15 feet with physical therapy.  Her IV and Foley was removed on postop day 1. The patient is to go home with family support and home health physical therapy.  The patient has had 3 units of transfused blood since surgery because of blood loss.  Her hemoglobin yesterday  Treatments: surgery:   stable left femur status post rodding.   PROCEDURE:  Procedure(s): Left hip affixus removal with repeat fixation with Affixus (Left)   SURGEON: Leitha Schuller, MD   ASSISTANTS: None   ANESTHESIA:   spinal   EBL:  Total I/O In: 200 [I.V.:200] Out: 100 [Blood:100]   BLOOD ADMINISTERED:none   DRAINS: none    LOCAL MEDICATIONS USED:  NONE   SPECIMEN:  No Specimen   DISPOSITION OF SPECIMEN:  N/A   COUNTS:  YES   TOURNIQUET:  * No tourniquets in log *   IMPLANTS: Biomet affixes 9 x 320 rod 125  degrees with 105 mm leg screw and 46 mm distal interlocking screw  Discharge Exam: Blood pressure (!) 153/77, pulse 85, temperature 98.8 F (37.1 C), resp. rate 16, height 5\' 4"  (1.626 m), weight 45.4 kg, SpO2 95 %.   Disposition: Discharge disposition: 01-Home or Self Care        Allergies as of 03/04/2022       Reactions   Azithromycin Nausea Only   GI issues        Medication List     TAKE these medications    acetaminophen 325 MG tablet Commonly known as: TYLENOL Take 1-2 tablets (325-650 mg total) by mouth every 6 (six) hours as needed for mild pain or moderate pain (pain score 1-3 or temp > 100.5).   CITRACAL PO Take 1 tablet by mouth daily. AM   feeding supplement Liqd Take 237 mLs by mouth 2 (two) times daily between meals.   ferrous fumarate-b12-vitamic C-folic acid capsule Commonly known as: TRINSICON / FOLTRIN Take 1 capsule by mouth 2 (two) times daily after a meal.   methocarbamol 500 MG tablet Commonly known as: ROBAXIN Take 1 tablet (500 mg total) by mouth every 6 (six) hours as needed for muscle spasms.   polyethylene glycol 17 g packet Commonly known as: MIRALAX / GLYCOLAX Take 17 g by mouth daily as needed for mild constipation.   PreserVision AREDS 2+Multi Vit Caps Take 1 capsule by mouth daily.  Follow-up Information     Evon Slack, PA-C Follow up on 03/15/2022.   Specialties: Orthopedic Surgery, Emergency Medicine Why: at 330 Contact information: [address] [facility] [place] [address] [phone]                 Signed: Lenard Forth, [name] 03/04/2022, 2:02 PM   Objective: Vital signs in last 24 hours: Temp:  [98 F (36.7 C)-98.8 F (37.1 C)] 98.8 F (37.1 C) (07/06 0729) Pulse Rate:  [85-88] 85 (07/06 0729) Resp:  [16] 16 (07/06 0729) BP: (127-153)/(67-88) 153/77 (07/06 0729) SpO2:  [95 %-100 %] 95 % (07/06 0729)  Intake/Output from previous day:  Intake/Output  Summary (Last 24 hours) at 03/04/2022 1402 Last data filed at 03/03/2022 2130 Gross per 24 hour  Intake --  Output 0 ml  Net 0 ml    Intake/Output this shift: No intake/output data recorded.  Labs: Recent Labs    03/02/22 0218 03/03/22 0450 03/03/22 1319 03/04/22 0434 03/04/22 1210  HGB 7.8* 6.8* 8.1* 7.9* 8.3*   Recent Labs    03/03/22 0450 03/04/22 0434  HCT 20.0* 23.8*   No results for input(s): "NA", "K", "CL", "CO2", "BUN", "CREATININE", "GLUCOSE", "CALCIUM" in the last 72 hours. No results for input(s): "LABPT", "INR" in the last 72 hours.  EXAM: General - Patient is Alert and Oriented Extremity - Neurovascular intact Sensation intact distally Dorsiflexion/Plantar flexion intact Compartment soft Incision - clean, dry, with the wound VAC in place and suctioning appropriately. Motor Function -plantarflexion and dorsiflexion are intact.  Ambulated 15 feet with physical therapy  Assessment/Plan: 6 Days Post-Op Procedure(s) (LRB): Left hip affixus removal with repeat fixation with Affixus (Left) Procedure(s) (LRB): Left hip affixus removal with repeat fixation with Affixus (Left) Past Medical History:  Diagnosis Date   Adnexal mass    Anemia    Arthritis    left wrist, bilateral hips   Chronic kidney disease    Stage 3b   Lactic acidosis    Macular degeneration    Orthostatic hypotension    Osteoporosis    Seasonal allergies    Sinus bradycardia    Syncope 02/21/2022   Vertigo    rare   Principal Problem:   S/P ORIF (open reduction internal fixation) fracture  Estimated body mass index is 17.16 kg/m as calculated from the following:   Height as of this encounter: 5\' 4"  (1.626 m).   Weight as of this encounter: 45.4 kg. Advance diet Up with therapy Discharge home with home health Diet - Regular diet Follow up - in 2 weeks Activity - WBAT Disposition - Home Condition Upon Discharge - Stable DVT Prophylaxis - Lovenox and TED hose  ,  PA-C Orthopaedic Surgery 03/04/2022, 2:02 PM

## 2022-03-04 NOTE — Progress Notes (Signed)
Discharge note: Pt's son given discharge instructions and he verbalized understanding. Prescription for Robaxin sent to pt's pharmacy and provena wound vac attached. Pt wheeled out by staff with all valuables, honeycomb dressings, and provena. Pt left via car driven by family.

## 2022-03-04 NOTE — TOC Progression Note (Signed)
Transition of Care [facility]) - Progression Note    Patient Details  Name: Cassie Solis MRN: [id number] Date of Birth: 18-Apr-1929  Transition of [facility]) CM/SW Contact  Marlowe Sax, RN Phone Number: 03/04/2022, 8:50 AM  Clinical Narrative:     Spoke with the patient's son Orvilla Fus, He said that the patient is set up with family staying with her around the clock, she also is open with [facility] for [facility], I notified Meg of the DC She will need a 3 in 1 to be delivered to the room by [employer], Bjorn Loser is aware and will deliver  Expected Discharge Plan: Home w Home Health Services Barriers to Discharge: Continued Medical Work up  Expected Discharge Plan and Services Expected Discharge Plan: Home w Home Health Services   Discharge Planning Services: CM Consult   Living arrangements for the past 2 months: Single Family Home Expected Discharge Date: 03/04/22               DME Arranged: N/A DME Agency: NA       HH Arranged: PT HH Agency: [facility] Date HH Agency Contacted: 03/03/22 Time HH Agency Contacted: 1058 Representative spoke with at [facility] Agency: meg   Social Determinants of Health (SDOH) Interventions    Readmission Risk Interventions    01/08/2022    1:46 PM  Readmission Risk Prevention Plan  Transportation Screening Complete  PCP or Specialist Appt within 5-7 Days Complete  Home Care Screening Complete  Medication Review (RN CM) Complete

## 2022-03-04 NOTE — Progress Notes (Signed)
Occupational Therapy Treatment Patient Details Name: Cassie Solis MRN: [id number] DOB: 01/25/1929 Today's Date: 03/04/2022   History of present illness Pt is a 88 y/o F admitted on 02/26/22. Pt with hx of fall 2/2 passing out at home on 02/21/22 resulting in L hip pain. Pt is s/p L ORIF on 01/07/22. Imaging showed a screw had shifted. Pt underwent sx on 02/26/22 for Left hip affixus removal with repeat fixation with Affixus. PMH: anemia, arthritis, CKD stage 3B, orthostatic hypotension, syncope, vertigo   OT comments  Ms. Korber made good effort today; she was able to engage in toileting and grooming with CGA-MinA, ambulating with RW and no physical assistance, but did display some instability with standing. Pt endorses 8/10 with movement as well as difficulty sleeping at night, 2/2 pain. Provided educ re: home safety, supervision, pain mgmt strategies, falls prevention. Pt verbalizes understanding. Pt and family have arranged for 24-hour care at home, therefore have upgraded DC recs from [facility] to [facility].    Recommendations for follow up therapy are one component of a multi-disciplinary discharge planning process, led by the attending physician.  Recommendations may be updated based on patient status, additional functional criteria and insurance authorization.    Follow Up Recommendations  Home health OT    Assistance Recommended at Discharge Frequent or constant Supervision/Assistance  Patient can return home with the following  A little help with walking and/or transfers;A little help with bathing/dressing/bathroom;Assist for transportation;Help with stairs or ramp for entrance   Equipment Recommendations  None recommended by OT    Recommendations for Other Services      Precautions / Restrictions Precautions Precautions: Fall Precaution Comments: Monitor orthostatics Restrictions Weight Bearing Restrictions: Yes LLE Weight Bearing: Weight bearing as tolerated       Mobility Bed  Mobility Overal bed mobility: Needs Assistance             General bed mobility comments: Pt received, left in recliner    Transfers Overall transfer level: Needs assistance Equipment used: Rolling walker (2 wheels) Transfers: Sit to/from Stand Sit to Stand: Supervision     Step pivot transfers: Min assist     General transfer comment: no physical assistance required to stand from EOB or recliner; does require assistance with standing from lower surfact     Balance Overall balance assessment: Needs assistance Sitting-balance support: No upper extremity supported, Feet supported Sitting balance-Leahy Scale: Good Sitting balance - Comments: no balance deficits in sitting with feet supported on floor   Standing balance support: Bilateral upper extremity supported, During functional activity, Reliant on assistive device for balance Standing balance-Leahy Scale: Fair Standing balance comment: close SUPV for safety, a few minor LOB episodes                           ADL either performed or assessed with clinical judgement   ADL Overall ADL's : Needs assistance/impaired     Grooming: Wash/dry hands;Wash/dry face;Standing;Min guard Grooming Details (indicate cue type and reason): requires one hand on sink at all times for balance                 Toilet Transfer: Rolling walker (2 wheels);Regular Toilet;Minimal assistance Toilet Transfer Details (indicate cue type and reason): Requires Min A to come up into standing from low toilet Toileting- Clothing Manipulation and Hygiene: Modified independent;Sitting/lateral lean              Extremity/Trunk Assessment Upper Extremity Assessment Upper Extremity  Assessment: Generalized weakness   Lower Extremity Assessment Lower Extremity Assessment: Generalized weakness LLE Deficits / Details: s/p L hip sx. pain limited.        Vision       Perception     Praxis      Cognition Arousal/Alertness:  Awake/alert Behavior During Therapy: WFL for tasks assessed/performed Overall Cognitive Status: Within Functional Limits for tasks assessed                                 General Comments: Pt is A and O x 4        Exercises Other Exercises Other Exercises: Educ re: falls prevention, home modifications and safety, pain mgmt strategies    Shoulder Instructions       General Comments reviewed HEP handout and pt perform 5 reps of each    Pertinent Vitals/ Pain       Pain Assessment Pain Assessment: 0-10 Pain Score: 8  Pain Location: L hip with movement Pain Descriptors / Indicators: Grimacing, Discomfort Pain Intervention(s): Utilized relaxation techniques, Repositioned, Monitored during session  Home Living                                          Prior Functioning/Environment              Frequency  Min 2X/week        Progress Toward Goals  OT Goals(current goals can now be found in the care plan section)  Progress towards OT goals: Progressing toward goals  Acute Rehab OT Goals OT Goal Formulation: With patient/family Time For Goal Achievement: 03/13/22 Potential to Achieve Goals: Good  Plan Frequency remains appropriate;Discharge plan needs to be updated    Co-evaluation                 AM-PAC OT "6 Clicks" Daily Activity     Outcome Measure   Help from another person eating meals?: None Help from another person taking care of personal grooming?: A Little Help from another person toileting, which includes using toliet, bedpan, or urinal?: A Little Help from another person bathing (including washing, rinsing, drying)?: A Little Help from another person to put on and taking off regular upper body clothing?: A Little Help from another person to put on and taking off regular lower body clothing?: A Lot 6 Click Score: 18    End of Session Equipment Utilized During Treatment: Rolling walker (2 wheels)  OT Visit  Diagnosis: Other abnormalities of gait and mobility (R26.89);Muscle weakness (generalized) (M62.81);Pain Pain - Right/Left: Left Pain - part of body: Hip   Activity Tolerance Patient tolerated treatment well   Patient Left in chair;with call bell/phone within reach;with nursing/sitter in room;with family/visitor present   Nurse Communication          Time: 3785-8850 OT Time Calculation (min): 25 min  Charges: OT General Charges $OT Visit: 1 Visit OT Treatments $Self Care/Home Management : 8-22 mins  Latina Craver, PhD, MS, OTR/L 03/04/22, 12:27 PM

## 2022-03-04 NOTE — Progress Notes (Cosign Needed)
Patient is not able to walk the distance required to go the bathroom, or he/she is unable to safely negotiate stairs required to access the bathroom.  A 3in1 BSC will alleviate this problem  

## 2022-03-04 NOTE — Progress Notes (Signed)
Physical Therapy Treatment Patient Details Name: Cassie Solis MRN: [id number] DOB: 04-22-1929 Today's Date: 03/04/2022   History of Present Illness Pt is a 88 y/o F admitted on 02/26/22. Pt with hx of fall 2/2 passing out at home on 02/21/22 resulting in L hip pain. Pt is s/p L ORIF on 01/07/22. Imaging showed a screw had shifted. Pt underwent sx on 02/26/22 for Left hip affixus removal with repeat fixation with Affixus. PMH: anemia, arthritis, CKD stage 3B, orthostatic hypotension, syncope, vertigo    PT Comments    Pt was long sitting in bed upon arriving. She is A and O and agreeable to session." I'm hoping I do good so I can go home." Pt endorses good nights sleep and is eager for OOB activity. She required min assist to exit bed however will have someone available to assist her with this at home. She stood and ambulated ~160 ft with RW without physical assistance. Pt returned to room and was repositioned in recliner. Pt tolerated performance of HEP without difficulty. PT recs changed to home with HHPT to follow. Will return after lunch for 1 more session prior to DC. Continuing to monitoring Hgb closely.    Recommendations for follow up therapy are one component of a multi-disciplinary discharge planning process, led by the attending physician.  Recommendations may be updated based on patient status, additional functional criteria and insurance authorization.  Follow Up Recommendations  Home health PT     Assistance Recommended at Discharge Intermittent Supervision/Assistance  Patient can return home with the following Assistance with cooking/housework;Assist for transportation;Help with stairs or ramp for entrance;A little help with bathing/dressing/bathroom;A little help with walking and/or transfers   Equipment Recommendations  BSC/3in1       Precautions / Restrictions Precautions Precautions: Fall Restrictions Weight Bearing Restrictions: Yes LLE Weight Bearing: Weight bearing as  tolerated     Mobility  Bed Mobility Overal bed mobility: Needs Assistance Bed Mobility: Supine to Sit  Supine to sit: Min assist, HOB elevated (pt will have 24/7 assistance at DC)  General bed mobility comments: Pt was able to exit R side of bed with increased time and min assist.    Transfers Overall transfer level: Needs assistance Equipment used: Rolling walker (2 wheels) Transfers: Sit to/from Stand Sit to Stand: Supervision  General transfer comment: no physical assistance required to stand from EOB or recliner    Ambulation/Gait Ambulation/Gait assistance: Supervision Gait Distance (Feet): 160 Feet Assistive device: Rolling walker (2 wheels) Gait Pattern/deviations: Step-through pattern, Narrow base of support Gait velocity: decreased  General Gait Details: pt was able to ambulate 160 ft with RW. no LOB. 2 standing rest    Balance Overall balance assessment: Needs assistance Sitting-balance support: No upper extremity supported, Feet supported Sitting balance-Leahy Scale: Good     Standing balance support: Bilateral upper extremity supported, During functional activity, Reliant on assistive device for balance Standing balance-Leahy Scale: Fair Standing balance comment: no LOB with BUE support       Cognition Arousal/Alertness: Awake/alert Behavior During Therapy: WFL for tasks assessed/performed Overall Cognitive Status: Within Functional Limits for tasks assessed      General Comments: Pt is A and O x 4           General Comments General comments (skin integrity, edema, etc.): reviewed HEP handout and pt perform 5 reps of each      Pertinent Vitals/Pain Pain Assessment Pain Assessment: 0-10 Pain Score: 3  Faces Pain Scale: Hurts a little bit Pain Location:  L hip with movement Pain Descriptors / Indicators: Grimacing, Discomfort Pain Intervention(s): Limited activity within patient's tolerance, Monitored during session, Premedicated before session,  Repositioned     PT Goals (current goals can now be found in the care plan section) Acute Rehab PT Goals Patient Stated Goal: get better, return home Progress towards PT goals: Progressing toward goals    Frequency    BID      PT Plan Current plan remains appropriate       AM-PAC PT "6 Clicks" Mobility   Outcome Measure  Help needed turning from your back to your side while in a flat bed without using bedrails?: A Little Help needed moving from lying on your back to sitting on the side of a flat bed without using bedrails?: A Little Help needed moving to and from a bed to a chair (including a wheelchair)?: A Little Help needed standing up from a chair using your arms (e.g., wheelchair or bedside chair)?: A Little Help needed to walk in hospital room?: A Little Help needed climbing 3-5 steps with a railing? : A Little 6 Click Score: 18    End of Session Equipment Utilized During Treatment: Gait belt Activity Tolerance: Patient tolerated treatment well Patient left: in chair;with call bell/phone within reach;with chair alarm set Nurse Communication: Mobility status PT Visit Diagnosis: Unsteadiness on feet (R26.81);Muscle weakness (generalized) (M62.81);Difficulty in walking, not elsewhere classified (R26.2);Pain;Other abnormalities of gait and mobility (R26.89) Pain - Right/Left: Left Pain - part of body: Hip     Time: 0753-0822 PT Time Calculation (min) (ACUTE ONLY): 29 min  Charges:  $Gait Training: 8-22 mins $Therapeutic Exercise: 8-22 mins                     Jetta Lout PTA 03/04/22, 9:46 AM

## 2022-03-04 NOTE — Progress Notes (Signed)
  Subjective: 6 Days Post-Op Procedure(s) (LRB): Left hip affixus removal with repeat fixation with Affixus (Left) Patient reports pain as well-controlled.  Occasional muscle spasms. Patient is well, and has had no acute complaints or problems. Feels much improved from yesterday.  Plan is to go Home vs SNF after hospital stay. Negative for chest pain and shortness of breath Fever: no Gastrointestinal: negative for nausea and vomiting.    Objective: Vital signs in last 24 hours: Temp:  [98 F (36.7 C)-98.5 F (36.9 C)] 98.4 F (36.9 C) (07/06 0511) Pulse Rate:  [79-88] 86 (07/06 0511) Resp:  [16-18] 16 (07/06 0511) BP: (105-147)/(58-88) 141/67 (07/06 0511) SpO2:  [96 %-100 %] 98 % (07/06 0511)  Intake/Output from previous day:  Intake/Output Summary (Last 24 hours) at 03/04/2022 0634 Last data filed at 03/03/2022 2130 Gross per 24 hour  Intake 850 ml  Output 0 ml  Net 850 ml     Intake/Output this shift: No intake/output data recorded.  Labs: Recent Labs    03/01/22 2127 03/02/22 0218 03/03/22 0450 03/03/22 1319 03/04/22 0434  HGB 8.4* 7.8* 6.8* 8.1* 7.9*   Recent Labs    03/01/22 0841 03/01/22 1846 03/03/22 0450 03/04/22 0434  WBC 10.3  --   --   --   RBC 1.95*  --   --   --   HCT 19.5*   < > 20.0* 23.8*  PLT 127*  --   --   --    < > = values in this interval not displayed.   No results for input(s): "NA", "K", "CL", "CO2", "BUN", "CREATININE", "GLUCOSE", "CALCIUM" in the last 72 hours. No results for input(s): "LABPT", "INR" in the last 72 hours.   EXAM General - Patient is Alert, Appropriate, and Oriented Extremity - Neurovascular intact Dorsiflexion/Plantar flexion intact Compartment soft Dressing/Incision -Prevena draining posteriorly distally, amount of blood pooling at distal end of proximal prevena, so new Tegaderm dressing added. Motor Function - intact, moving foot and toes well on exam.  Ambulated 15 feet with physical  therapy.   Assessment/Plan: 6 Days Post-Op Procedure(s) (LRB): Left hip affixus removal with repeat fixation with Affixus (Left) Principal Problem:   S/P ORIF (open reduction internal fixation) fracture  Estimated body mass index is 17.16 kg/m as calculated from the following:   Height as of this encounter: 5\' 4"  (1.626 m).   Weight as of this encounter: 45.4 kg. Advance diet Up with therapy  Hemoglobin 7.9 after 3 units now since surgery.  Wound VAC is in place with better fixation and suction. Goal for discharge to home pending adequate progress with PT. ambulated 15 feet yesterday.  DVT Prophylaxis - Lovenox, Ted hose, and SCDs Weight-Bearing as tolerated to left leg  PA-C [facility] 03/04/2022, 6:34 AM

## 2022-03-04 NOTE — Care Management Important Message (Signed)
Important Message  Patient Details  Name: Cassie Solis MRN: [id number] Date of Birth: 10-19-1928   Medicare Important Message Given:  Yes     Olegario Messier A Kyria Bumgardner 03/04/2022, 10:09 AM

## 2022-03-16 ENCOUNTER — Encounter: Payer: Self-pay | Admitting: Orthopedic Surgery

## 2022-04-16 ENCOUNTER — Encounter: Payer: Self-pay | Admitting: Orthopedic Surgery

## 2022-08-15 ENCOUNTER — Other Ambulatory Visit: Payer: Self-pay

## 2022-08-15 ENCOUNTER — Emergency Department: Payer: PPO

## 2022-08-15 ENCOUNTER — Inpatient Hospital Stay
Admission: EM | Admit: 2022-08-15 | Discharge: 2022-08-20 | DRG: 481 | Disposition: A | Discharge status: Skilled Nursing Facility | Payer: PPO | Attending: Internal Medicine | Admitting: Internal Medicine

## 2022-08-15 DIAGNOSIS — F32A Depression, unspecified: Secondary | ICD-10-CM | POA: Diagnosis present

## 2022-08-15 DIAGNOSIS — K5900 Constipation, unspecified: Secondary | ICD-10-CM | POA: Insufficient documentation

## 2022-08-15 DIAGNOSIS — Z881 Allergy status to other antibiotic agents status: Secondary | ICD-10-CM

## 2022-08-15 DIAGNOSIS — D539 Nutritional anemia, unspecified: Secondary | ICD-10-CM

## 2022-08-15 DIAGNOSIS — W010XXA Fall on same level from slipping, tripping and stumbling without subsequent striking against object, initial encounter: Secondary | ICD-10-CM | POA: Diagnosis present

## 2022-08-15 DIAGNOSIS — Z66 Do not resuscitate: Secondary | ICD-10-CM | POA: Diagnosis present

## 2022-08-15 DIAGNOSIS — F0393 Unspecified dementia, unspecified severity, with mood disturbance: Secondary | ICD-10-CM | POA: Diagnosis present

## 2022-08-15 DIAGNOSIS — Z681 Body mass index (BMI) 19 or less, adult: Secondary | ICD-10-CM | POA: Diagnosis not present

## 2022-08-15 DIAGNOSIS — M25561 Pain in right knee: Secondary | ICD-10-CM | POA: Diagnosis present

## 2022-08-15 DIAGNOSIS — S72142S Displaced intertrochanteric fracture of left femur, sequela: Secondary | ICD-10-CM

## 2022-08-15 DIAGNOSIS — Y92009 Unspecified place in unspecified non-institutional (private) residence as the place of occurrence of the external cause: Secondary | ICD-10-CM | POA: Diagnosis not present

## 2022-08-15 DIAGNOSIS — S72141A Displaced intertrochanteric fracture of right femur, initial encounter for closed fracture: Secondary | ICD-10-CM | POA: Diagnosis present

## 2022-08-15 DIAGNOSIS — N9489 Other specified conditions associated with female genital organs and menstrual cycle: Secondary | ICD-10-CM | POA: Diagnosis not present

## 2022-08-15 DIAGNOSIS — I951 Orthostatic hypotension: Secondary | ICD-10-CM | POA: Diagnosis present

## 2022-08-15 DIAGNOSIS — E872 Acidosis, unspecified: Secondary | ICD-10-CM | POA: Diagnosis present

## 2022-08-15 DIAGNOSIS — R42 Dizziness and giddiness: Secondary | ICD-10-CM | POA: Diagnosis present

## 2022-08-15 DIAGNOSIS — E876 Hypokalemia: Secondary | ICD-10-CM | POA: Diagnosis not present

## 2022-08-15 DIAGNOSIS — S72001S Fracture of unspecified part of neck of right femur, sequela: Secondary | ICD-10-CM | POA: Diagnosis not present

## 2022-08-15 DIAGNOSIS — E440 Moderate protein-calorie malnutrition: Secondary | ICD-10-CM | POA: Diagnosis present

## 2022-08-15 DIAGNOSIS — H3530 Unspecified macular degeneration: Secondary | ICD-10-CM | POA: Diagnosis present

## 2022-08-15 DIAGNOSIS — S72001A Fracture of unspecified part of neck of right femur, initial encounter for closed fracture: Secondary | ICD-10-CM | POA: Diagnosis present

## 2022-08-15 DIAGNOSIS — M810 Age-related osteoporosis without current pathological fracture: Secondary | ICD-10-CM | POA: Diagnosis present

## 2022-08-15 DIAGNOSIS — D62 Acute posthemorrhagic anemia: Secondary | ICD-10-CM | POA: Diagnosis not present

## 2022-08-15 DIAGNOSIS — S0003XA Contusion of scalp, initial encounter: Secondary | ICD-10-CM | POA: Diagnosis present

## 2022-08-15 DIAGNOSIS — W19XXXA Unspecified fall, initial encounter: Secondary | ICD-10-CM

## 2022-08-15 DIAGNOSIS — N1831 Chronic kidney disease, stage 3a: Secondary | ICD-10-CM | POA: Diagnosis present

## 2022-08-15 DIAGNOSIS — N1832 Chronic kidney disease, stage 3b: Secondary | ICD-10-CM

## 2022-08-15 DIAGNOSIS — Z79899 Other long term (current) drug therapy: Secondary | ICD-10-CM | POA: Diagnosis not present

## 2022-08-15 DIAGNOSIS — N183 Chronic kidney disease, stage 3 unspecified: Secondary | ICD-10-CM | POA: Insufficient documentation

## 2022-08-15 DIAGNOSIS — D509 Iron deficiency anemia, unspecified: Secondary | ICD-10-CM | POA: Diagnosis present

## 2022-08-15 DIAGNOSIS — Z01810 Encounter for preprocedural cardiovascular examination: Principal | ICD-10-CM

## 2022-08-15 DIAGNOSIS — S0083XA Contusion of other part of head, initial encounter: Secondary | ICD-10-CM

## 2022-08-15 LAB — CBC WITH DIFFERENTIAL/PLATELET
Abs Immature Granulocytes: 0.03 10*3/uL (ref 0.00–0.07)
Basophils Absolute: 0.1 10*3/uL (ref 0.0–0.1)
Basophils Relative: 1 %
Eosinophils Absolute: 0 10*3/uL (ref 0.0–0.5)
Eosinophils Relative: 0 %
HCT: 31.7 % — ABNORMAL LOW (ref 36.0–46.0)
Hemoglobin: 10.6 g/dL — ABNORMAL LOW (ref 12.0–15.0)
Immature Granulocytes: 0 %
Lymphocytes Relative: 14 %
Lymphs Abs: 1.5 10*3/uL (ref 0.7–4.0)
MCH: 34.2 pg — ABNORMAL HIGH (ref 26.0–34.0)
MCHC: 33.4 g/dL (ref 30.0–36.0)
MCV: 102.3 fL — ABNORMAL HIGH (ref 80.0–100.0)
Monocytes Absolute: 0.7 10*3/uL (ref 0.1–1.0)
Monocytes Relative: 7 %
Neutro Abs: 8.2 10*3/uL — ABNORMAL HIGH (ref 1.7–7.7)
Neutrophils Relative %: 78 %
Platelets: 148 10*3/uL — ABNORMAL LOW (ref 150–400)
RBC: 3.1 MIL/uL — ABNORMAL LOW (ref 3.87–5.11)
RDW: 14 % (ref 11.5–15.5)
WBC: 10.5 10*3/uL (ref 4.0–10.5)
nRBC: 0 % (ref 0.0–0.2)

## 2022-08-15 LAB — BASIC METABOLIC PANEL
Anion gap: 9 (ref 5–15)
BUN: 31 mg/dL — ABNORMAL HIGH (ref 8–23)
CO2: 23 mmol/L (ref 22–32)
Calcium: 9 mg/dL (ref 8.9–10.3)
Chloride: 107 mmol/L (ref 98–111)
Creatinine, Ser: 1.01 mg/dL — ABNORMAL HIGH (ref 0.44–1.00)
GFR, Estimated: 53 mL/min — ABNORMAL LOW (ref >60)
Glucose, Bld: 121 mg/dL — ABNORMAL HIGH (ref 70–99)
Potassium: 3.8 mmol/L (ref 3.5–5.1)
Sodium: 139 mmol/L (ref 135–145)

## 2022-08-15 LAB — PROTIME-INR
INR: 1.3 — ABNORMAL HIGH (ref 0.8–1.2)
Prothrombin Time: 15.7 seconds — ABNORMAL HIGH (ref 11.4–15.2)

## 2022-08-15 LAB — APTT: aPTT: 25 seconds (ref 24–36)

## 2022-08-15 MED ORDER — MIRTAZAPINE 15 MG PO TABS
15.0000 mg | ORAL_TABLET | Freq: Every day | ORAL | Status: DC
  Administered 2022-08-15 – 2022-08-19 (×5): 15 mg via ORAL
  Filled 2022-08-15 (×5): qty 1

## 2022-08-15 MED ORDER — METHOCARBAMOL 500 MG PO TABS
500.0000 mg | ORAL_TABLET | Freq: Three times a day (TID) | ORAL | Status: DC | PRN
  Administered 2022-08-15: 500 mg via ORAL
  Filled 2022-08-15 (×3): qty 1

## 2022-08-15 MED ORDER — POLYETHYLENE GLYCOL 3350 17 G PO PACK: 17.0000 g | PACK | Freq: Every day | ORAL | Status: DC | PRN

## 2022-08-15 MED ORDER — ONDANSETRON HCL 4 MG/2ML IJ SOLN
4.0000 mg | Freq: Three times a day (TID) | INTRAMUSCULAR | Status: DC | PRN
  Filled 2022-08-15: qty 2

## 2022-08-15 MED ORDER — MORPHINE SULFATE (PF) 2 MG/ML IV SOLN
2.0000 mg | INTRAVENOUS | Status: DC | PRN
  Administered 2022-08-15 (×2): 2 mg via INTRAVENOUS
  Filled 2022-08-15 (×2): qty 1

## 2022-08-15 MED ORDER — CEFAZOLIN SODIUM-DEXTROSE 2-4 GM/100ML-% IV SOLN
2.0000 g | Freq: Once | INTRAVENOUS | Status: AC
  Administered 2022-08-16: 2 g via INTRAVENOUS

## 2022-08-15 MED ORDER — MORPHINE SULFATE (PF) 2 MG/ML IV SOLN: 0.5000 mg | INTRAVENOUS | Status: DC | PRN

## 2022-08-15 MED ORDER — LIDOCAINE 5 % EX PTCH
1.0000 | MEDICATED_PATCH | CUTANEOUS | Status: DC
  Administered 2022-08-15: 1 via TRANSDERMAL
  Filled 2022-08-15: qty 1

## 2022-08-15 MED ORDER — TRANEXAMIC ACID-NACL 1000-0.7 MG/100ML-% IV SOLN
1000.0000 mg | Freq: Once | INTRAVENOUS | Status: AC
  Administered 2022-08-16: 1000 mg via INTRAVENOUS
  Filled 2022-08-15: qty 100

## 2022-08-15 MED ORDER — SENNOSIDES-DOCUSATE SODIUM 8.6-50 MG PO TABS: 1.0000 | ORAL_TABLET | Freq: Every evening | ORAL | Status: DC | PRN

## 2022-08-15 MED ORDER — FENTANYL CITRATE PF 50 MCG/ML IJ SOSY
25.0000 ug | PREFILLED_SYRINGE | Freq: Once | INTRAMUSCULAR | Status: AC
  Administered 2022-08-15: 25 ug via INTRAVENOUS
  Filled 2022-08-15: qty 1

## 2022-08-15 MED ORDER — SODIUM CHLORIDE 0.9 % IV SOLN: INTRAVENOUS | Status: DC

## 2022-08-15 MED ORDER — FE FUM-VIT C-VIT B12-FA 460-60-0.01-1 MG PO CAPS
1.0000 | ORAL_CAPSULE | Freq: Every day | ORAL | Status: DC
  Administered 2022-08-17 – 2022-08-20 (×4): 1 via ORAL
  Filled 2022-08-15 (×5): qty 1

## 2022-08-15 MED ORDER — FERROUS SULFATE 325 (65 FE) MG PO TABS
325.0000 mg | ORAL_TABLET | Freq: Every day | ORAL | Status: DC
  Administered 2022-08-17 – 2022-08-20 (×4): 325 mg via ORAL
  Filled 2022-08-15 (×4): qty 1

## 2022-08-15 MED ORDER — OXYCODONE-ACETAMINOPHEN 5-325 MG PO TABS
1.0000 | ORAL_TABLET | ORAL | Status: DC | PRN
  Administered 2022-08-15 – 2022-08-16 (×2): 1 via ORAL
  Filled 2022-08-15 (×2): qty 1

## 2022-08-15 MED ORDER — FENTANYL CITRATE PF 50 MCG/ML IJ SOSY
25.0000 ug | PREFILLED_SYRINGE | INTRAMUSCULAR | Status: AC | PRN
  Administered 2022-08-15 (×2): 25 ug via INTRAVENOUS
  Filled 2022-08-15 (×2): qty 1

## 2022-08-15 MED ORDER — HYDROMORPHONE HCL 1 MG/ML IJ SOLN
0.2000 mg | Freq: Once | INTRAMUSCULAR | Status: AC
  Administered 2022-08-15: 0.2 mg via INTRAVENOUS
  Filled 2022-08-15: qty 0.5

## 2022-08-15 MED ORDER — ACETAMINOPHEN 325 MG PO TABS: 650.0000 mg | ORAL_TABLET | Freq: Four times a day (QID) | ORAL | Status: DC | PRN

## 2022-08-15 MED ORDER — FENTANYL CITRATE PF 50 MCG/ML IJ SOSY: 50.0000 ug | PREFILLED_SYRINGE | INTRAMUSCULAR | Status: DC | PRN

## 2022-08-15 NOTE — Plan of Care (Signed)

## 2022-08-15 NOTE — ED Notes (Signed)
Advised nurse that patient ready

## 2022-08-15 NOTE — Consult Note (Signed)
ORTHOPAEDIC CONSULTATION  REQUESTING PHYSICIAN: Lorretta Harp, MD  Chief Complaint:   R hip pain  History of Present Illness: Patient with son at bedside.  Cassie Solis is a 88 y.o. female who had a fall at home earlier today.  The patient noted immediate hip pain and inability to ambulate.  The patient ambulates with a walker at baseline.  The patient lives at home. She has nursing support at night. Her family lives in an adjacent house. Pain is worse with any sort of movement.  X-rays in the emergency department show a right intertrochanteric hip fracture.  Of note, she underwent left hip IM nailing on 01/07/2022 by Dr. Rosita Kea.  She had been doing well until she passed out and had a fall on 02/21/2022 with significantly increased left hip pain.  Radiographs showed cutout of the cephalomedullary screw and she underwent revision IM nailing on 02/26/2022. Radiographs today do not show significant sign of complication on the L side. Patient states that side is significantly shortened, and she uses a custom shoe with built in heel lift. She had been progressing adequately from this surgery and had been ambulatory with a walker with intermittent pain the in the L hip.  Past Medical History:  Diagnosis Date   Adnexal mass    Anemia    Arthritis    left wrist, bilateral hips   Chronic kidney disease    Stage 3b   Lactic acidosis    Macular degeneration    Orthostatic hypotension    Osteoporosis    Seasonal allergies    Sinus bradycardia    Syncope 02/21/2022   Vertigo    rare   Past Surgical History:  Procedure Laterality Date   ABDOMINAL HYSTERECTOMY     CATARACT EXTRACTION W/PHACO Left 06/25/2015   Procedure: CATARACT EXTRACTION PHACO AND INTRAOCULAR LENS PLACEMENT (IOC);  Surgeon: Lockie Mola, MD;  Location: [facility];  Service: Ophthalmology;  Laterality: Left;  PT PREFERS EARLY MORNING PER DR OFFICE    CATARACT EXTRACTION W/PHACO Right 09/10/2015   Procedure: CATARACT EXTRACTION PHACO AND INTRAOCULAR LENS PLACEMENT (IOC);  Surgeon: Lockie Mola, MD;  Location: [facility];  Service: Ophthalmology;  Laterality: Right;  pt prefers early AM   FEMUR IM NAIL Left 02/26/2022   Procedure: Left hip affixus removal with repeat fixation with Affixus;  Surgeon: Kennedy Bucker, MD;  Location: [facility];  Service: Orthopedics;  Laterality: Left;   INTRAMEDULLARY (IM) NAIL INTERTROCHANTERIC Left 01/07/2022   Procedure: INTRAMEDULLARY (IM) NAIL INTERTROCHANTRIC;  Surgeon: Kennedy Bucker, MD;  Location: [facility];  Service: Orthopedics;  Laterality: Left;   Social History   Socioeconomic History   Marital status: Married    Spouse name: Not on file   Number of children: Not on file   Years of education: Not on file   Highest education level: Not on file  Occupational History   Not on file  Tobacco Use   Smoking status: Never   Smokeless tobacco: Not on file  Substance and Sexual Activity   Alcohol use: No   Drug use: Not on file   Sexual activity: Not on file  Other Topics Concern   Not on file  Social History Narrative   ** Merged History Encounter **       Social Determinants of Health   Financial Resource Strain: Not on file  Food Insecurity: Not on file  Transportation Needs: Not on file  Physical Activity: Not on file  Stress: Not on file  Social  Connections: Not on file   History reviewed. No pertinent family history. Allergies  Allergen Reactions   Azithromycin Nausea Only    GI issues   Prior to Admission medications   Medication Sig Start Date End Date Taking? Authorizing Provider  acetaminophen (TYLENOL) 325 MG tablet Take 1-2 tablets (325-650 mg total) by mouth every 6 (six) hours as needed for mild pain or moderate pain (pain score 1-3 or temp > 100.5). 01/12/22   Alford HighlandWieting, Richard, MD  Calcium Citrate (CITRACAL PO) Take 1 tablet by mouth daily. AM    [provider]  feeding supplement (ENSURE ENLIVE / ENSURE PLUS) LIQD Take 237 mLs by mouth 2 (two) times daily between meals. 01/12/22   Alford HighlandWieting, Richard, MD  ferrous fumarate-b12-vitamic C-folic acid (TRINSICON / FOLTRIN) capsule Take 1 capsule by mouth 2 (two) times daily after a meal. 01/12/22   Renae GlossWieting, Richard, MD  methocarbamol (ROBAXIN) 500 MG tablet Take 1 tablet (500 mg total) by mouth every 6 (six) hours as needed for muscle spasms. 03/04/22   Dedra SkeensMundy, Todd, PA-C  Multiple Vitamins-Minerals (PRESERVISION AREDS 2+MULTI VIT) CAPS Take 1 capsule by mouth daily.    [provider]  polyethylene glycol (MIRALAX / GLYCOLAX) 17 g packet Take 17 g by mouth daily as needed for mild constipation. 01/12/22   Alford HighlandWieting, Richard, MD   Recent Labs    08/15/22 0836  WBC 10.5  HGB 10.6*  HCT 31.7*  PLT 148*  K 3.8  CL 107  CO2 23  BUN 31*  CREATININE 1.01*  GLUCOSE 121*  CALCIUM 9.0   DG Chest 1 View  Result Date: 08/15/2022 CLINICAL DATA:  [id number] Fall, initial encounter [id number] EXAM: CHEST  1 VIEW COMPARISON:  01/06/2022 FINDINGS: The heart size and mediastinal contours are within normal limits. There is diffuse pulmonary interstitial prominence which could be seen with atypical infection, edema or interstitial lung disease. There is no focal consolidation. Aorta is calcified. The visualized skeletal structures are unremarkable. IMPRESSION: Nonspecific interstitial prominence consistent with atypical infection, edema or interstitial lung disease. No focal consolidation. Electronically Signed   By: Layla MawJoshua  Pleasure M.D.   On: 08/15/2022 08:31   DG Tibia/Fibula Right  Result Date: 08/15/2022 CLINICAL DATA:  Fall EXAM: RIGHT TIBIA AND FIBULA - 2 VIEW COMPARISON:  None Available. FINDINGS: Osseous structures appear osteopenic. There is no evidence of fracture or other focal bone lesions. Soft tissues are unremarkable. IMPRESSION: Osteopenia.  No acute osseous abnormalities. Electronically Signed    By: Layla MawJoshua  Pleasure M.D.   On: 08/15/2022 08:30   DG Femur Min 2 Views Right  Result Date: 08/15/2022 CLINICAL DATA:  Fall EXAM: RIGHT FEMUR 2 VIEWS; DG HIP (WITH OR WITHOUT PELVIS) 3V RIGHT COMPARISON:  None Available. FINDINGS: Osseous structures osteopenic. Patient is status post previous ORIF left femoral neck. Pelvic ring is intact. Lumbosacral degenerative changes are noted. There is an intertrochanteric fracture right femoral neck with varus angulation and minimal displacement. Distal portions of the right femur are intact. IMPRESSION: Intertrochanteric right femoral neck fracture. Electronically Signed   By: Layla MawJoshua  Pleasure M.D.   On: 08/15/2022 08:30   DG Hip Unilat W or Wo Pelvis 2-3 Views Right  Result Date: 08/15/2022 CLINICAL DATA:  Fall EXAM: RIGHT FEMUR 2 VIEWS; DG HIP (WITH OR WITHOUT PELVIS) 3V RIGHT COMPARISON:  None Available. FINDINGS: Osseous structures osteopenic. Patient is status post previous ORIF left femoral neck. Pelvic ring is intact. Lumbosacral degenerative changes are noted. There is an intertrochanteric fracture right femoral  neck with varus angulation and minimal displacement. Distal portions of the right femur are intact. IMPRESSION: Intertrochanteric right femoral neck fracture. Electronically Signed   By: Layla Maw M.D.   On: 08/15/2022 08:30   DG Ankle 2 Views Right  Result Date: 08/15/2022 CLINICAL DATA:  Fall EXAM: RIGHT ANKLE - 2 VIEW COMPARISON:  None Available. FINDINGS: There is no evidence of fracture, dislocation, or joint effusion. There is no evidence of arthropathy or other focal bone abnormality. Soft tissues are unremarkable. Small plantar and large posterior calcaneal spurs are IMPRESSION: Calcaneal spurs.  No acute osseous abnormalities. Electronically Signed   By: Layla Maw M.D.   On: 08/15/2022 08:28   DG Knee Complete 4 Views Right  Result Date: 08/15/2022 CLINICAL DATA:  fall EXAM: RIGHT KNEE - COMPLETE 4 VIEW COMPARISON:   None Available. FINDINGS: No evidence of fracture, dislocation, or joint effusion. There is lateral compartment joint space narrowing, sclerosis and osteophytes consistent with degenerative joint disease. No evidence of effusion. IMPRESSION: Degenerative changes. Electronically Signed   By: Layla Maw M.D.   On: 08/15/2022 08:27   CT Cervical Spine Wo Contrast  Result Date: 08/15/2022 CLINICAL DATA:  88 year old female status post fall while going to bathroom. Pain. EXAM: CT CERVICAL SPINE WITHOUT CONTRAST TECHNIQUE: Multidetector CT imaging of the cervical spine was performed without intravenous contrast. Multiplanar CT image reconstructions were also generated. RADIATION DOSE REDUCTION: This exam was performed according to the departmental dose-optimization program which includes automated exposure control, adjustment of the mA and/or kV according to patient size and/or use of iterative reconstruction technique. COMPARISON:  Head CT today. FINDINGS: Alignment: Mild straightening of cervical lordosis. Mild degenerative appearing anterolisthesis of C5 on C6 with chronic facet degeneration on the left. Similar C7-T1 anterolisthesis with bilateral facet degeneration. Bilateral posterior element alignment is within normal limits. Skull base and vertebrae: Bone mineralization is within normal limits for age. Visualized skull base is intact. No atlanto-occipital dissociation. C1 and C2 appear intact and aligned. No acute osseous abnormality identified. Soft tissues and spinal canal: No prevertebral fluid or swelling. No visible canal hematoma. Bulky calcified cervical carotid atherosclerosis and mild motion artifact in the visible neck. Disc levels: C3-C4 degenerative appearing ankylosis including by of the left facet. Adjacent left side facet arthropathy at multiple levels. Chronic disc and endplate degeneration. But fairly capacious appearance of the cervical spinal canal by CT. Mild if any spinal stenosis  suspected. Upper chest: Partially calcified apical lung scarring. Calcified aortic atherosclerosis. Grossly intact visible upper thoracic levels. IMPRESSION: 1. No acute traumatic injury identified in the cervical spine. 2. Fairly age-appropriate cervical spine degeneration with some degenerative spondylolisthesis and ankylosis. 3. Calcified cervical carotid atherosclerosis. Aortic Atherosclerosis (ICD10-I70.0). Electronically Signed   By: Odessa Fleming M.D.   On: 08/15/2022 07:34   CT Head Wo Contrast  Result Date: 08/15/2022 CLINICAL DATA:  88 year old female status post fall while going to bathroom. Pain. EXAM: CT HEAD WITHOUT CONTRAST TECHNIQUE: Contiguous axial images were obtained from the base of the skull through the vertex without intravenous contrast. RADIATION DOSE REDUCTION: This exam was performed according to the departmental dose-optimization program which includes automated exposure control, adjustment of the mA and/or kV according to patient size and/or use of iterative reconstruction technique. COMPARISON:  Head CT 01/06/2022 and earlier. FINDINGS: Brain: Stable cerebral volume. No midline shift, ventriculomegaly, mass effect, evidence of mass lesion, intracranial hemorrhage or evidence of cortically based acute infarction. Stable gray-white matter differentiation throughout the brain. Vascular: Calcified atherosclerosis at  the skull base. No suspicious intracranial vascular hyperdensity. Skull: Stable hyperostosis, intact. Sinuses/Orbits: Visualized paranasal sinuses and mastoids are stable and well aerated. Other: Broad-based right posterior scalp hematoma measures up to 15 mm in thickness, roughly 5.5 cm diameter. No scalp soft tissue gas. Other scalp and orbits soft tissues appears stable. Underlying calvarium appears stable and intact. IMPRESSION: 1. Right posterior scalp hematoma without underlying skull fracture. 2. Stable non contrast CT appearance of the brain, no acute intracranial  abnormality. Electronically Signed   By: Odessa Fleming M.D.   On: 08/15/2022 07:30     Positive ROS: All other systems have been reviewed and were otherwise negative with the exception of those mentioned in the HPI and as above.  Physical Exam: BP (!) 180/86 (BP Location: Left Arm)   Pulse 67   Temp (!) 97.5 F (36.4 C) (Oral)   Resp (!) 22   Ht 5\' 4"  (1.626 m)   Wt 47.6 kg   SpO2 96%   BMI 18.02 kg/m  General:  Alert, no acute distress Psychiatric:  Patient is competent for consent with normal mood and affect   Cardiovascular:  No pedal edema, regular rate and rhythm  Orthopedic Exam:  RLE: 5/5 DF/PF/EHL SILT s/s/t/sp/dp distr Foot wwp +Log roll/axial load   X-rays:  As above: R intertrochanteric hip fracture, healed L hip intertrochanteric fracture with IM nail in place  Assessment/Plan: Cassie Solis is a 88 y.o. female with a R intertrochanteric hip fracture   1. I discussed the various treatment options including both surgical and non-surgical management of the fracture with the patient and/or family (medical PoA). We discussed the high risk of perioperative complications due to patient's age, dementia, and other co-morbidities. After discussion of risks, benefits, and alternatives to surgery, the family and/or patient were in agreement to proceed with surgery. The goals of surgery would be to provide adequate pain relief and allow for mobilization. Plan for surgery is R hip cephalomedullary nailing tomorrow, 08/16/22 2. NPO after midnight 3. Hold anticoagulation in advance of OR 4. Can give 1 dose IV TXA in ED. 5. Admit to Hospitalist service   08/18/22   08/15/2022 10:53 AM

## 2022-08-15 NOTE — H&P (Signed)
History and Physical    Cassie Solis NWG:[id number] DOB: [birth year] DOA: 08/15/2022  Referring MD/NP/PA:   PCP: Ailene Ravel, MD   Patient coming from:  The patient is coming from home.    Chief Complaint: fall and right hip pain   HPI: Cassie Solis is a 88 y.o. female with medical history significant of syncope, vertigo, CKD-3B, anemia, sinus bradycardia, orthostatic hypotension, left hip fracture in 01/2022, who presents with fall and right hip pain.  Patient states that she tripped her steps and fell accidentally when she got up and went to the bathroom in the early morning.  No loss of consciousness.  She hit her head and injured her right hip, causing pain in the right hip.  The pain is constant, sharp, severe, nonradiating.  She also has some pain in right knee and the right lower leg.  Patient does not have chest pain, cough, shortness breath.  No nausea vomiting, diarrhea or abdominal pain.  No symptoms of UTI.  Data reviewed independently and ED Course: pt was found to have WBC 10.5, renal function close to baseline, temperature 97.5, blood pressure 180/86, heart rate 67, oxygen saturation 96% room air, RR 22.  CT of head is negative for acute intracranial abnormalities, but showed scalp hematoma.  CT of C-spine is negative for acute injury, but showed degenerative disc disease.  Negative x-ray of right ankle, right wrist, right knee, right tibia/fibula.  X-ray of right femur/pelvis that showed right intertrochanteric femoral neck fracture.  Patient is admitted to MedSurg bed as inpatient.  Dr. Allena Katz of Ortho is consulted.   EKG: Not done in ED, will get one.   Review of Systems:   General: no fevers, chills, no body weight gain, has fatigue HEENT: no blurry vision, hearing changes or sore throat Respiratory: no dyspnea, coughing, wheezing CV: no chest pain, no palpitations GI: no nausea, vomiting, abdominal pain, diarrhea, constipation GU: no dysuria, burning on  urination, increased urinary frequency, hematuria  Ext: no leg edema Neuro: no unilateral weakness, numbness, or tingling, no vision change or hearing loss. Has fall Skin: no rash, no skin tear. MSK: has pain in right hip, right knee and the right lower leg Heme: No easy bruising.  Travel history: No recent long distant travel.   Allergy:  Allergies  Allergen Reactions   Azithromycin Nausea Only    GI issues    Past Medical History:  Diagnosis Date   Adnexal mass    Anemia    Arthritis    left wrist, bilateral hips   Chronic kidney disease    Stage 3b   Lactic acidosis    Macular degeneration    Orthostatic hypotension    Osteoporosis    Seasonal allergies    Sinus bradycardia    Syncope 02/21/2022   Vertigo    rare    Past Surgical History:  Procedure Laterality Date   ABDOMINAL HYSTERECTOMY     CATARACT EXTRACTION W/PHACO Left 06/25/2015   Procedure: CATARACT EXTRACTION PHACO AND INTRAOCULAR LENS PLACEMENT (IOC);  Surgeon: Lockie Mola, MD;  Location: [facility];  Service: Ophthalmology;  Laterality: Left;  PT PREFERS EARLY MORNING PER DR OFFICE   CATARACT EXTRACTION W/PHACO Right 09/10/2015   Procedure: CATARACT EXTRACTION PHACO AND INTRAOCULAR LENS PLACEMENT (IOC);  Surgeon: Lockie Mola, MD;  Location: [facility];  Service: Ophthalmology;  Laterality: Right;  pt prefers early AM   FEMUR IM NAIL Left 02/26/2022   Procedure: Left hip affixus removal with  repeat fixation with Affixus;  Surgeon: Kennedy BuckerMenz, Michael, MD;  Location: [facility];  Service: Orthopedics;  Laterality: Left;   INTRAMEDULLARY (IM) NAIL INTERTROCHANTERIC Left 01/07/2022   Procedure: INTRAMEDULLARY (IM) NAIL INTERTROCHANTRIC;  Surgeon: Kennedy BuckerMenz, Michael, MD;  Location: [facility];  Service: Orthopedics;  Laterality: Left;    Social History:  reports that she has never smoked. She does not have any smokeless tobacco history on file. She reports that she does not drink alcohol. No  history on file for drug use.  Family History: History reviewed. No pertinent family history.   Prior to Admission medications   Medication Sig Start Date End Date Taking? Authorizing Provider  acetaminophen (TYLENOL) 325 MG tablet Take 1-2 tablets (325-650 mg total) by mouth every 6 (six) hours as needed for mild pain or moderate pain (pain score 1-3 or temp > 100.5). 01/12/22   Alford HighlandWieting, Richard, MD  Calcium Citrate (CITRACAL PO) Take 1 tablet by mouth daily. AM    [provider]  feeding supplement (ENSURE ENLIVE / ENSURE PLUS) LIQD Take 237 mLs by mouth 2 (two) times daily between meals. 01/12/22   Alford HighlandWieting, Richard, MD  ferrous fumarate-b12-vitamic C-folic acid (TRINSICON / FOLTRIN) capsule Take 1 capsule by mouth 2 (two) times daily after a meal. 01/12/22   Renae GlossWieting, Richard, MD  methocarbamol (ROBAXIN) 500 MG tablet Take 1 tablet (500 mg total) by mouth every 6 (six) hours as needed for muscle spasms. 03/04/22   Dedra SkeensMundy, Todd, PA-C  Multiple Vitamins-Minerals (PRESERVISION AREDS 2+MULTI VIT) CAPS Take 1 capsule by mouth daily.    [provider]  polyethylene glycol (MIRALAX / GLYCOLAX) 17 g packet Take 17 g by mouth daily as needed for mild constipation. 01/12/22   Alford HighlandWieting, Richard, MD    Physical Exam: Vitals:   08/15/22 1100 08/15/22 1130 08/15/22 1130 08/15/22 1200  BP: 113/64 (!) 155/90 (!) 159/90 136/68  Pulse: 74 76 76 72  Resp:   18 16  Temp:   98 F (36.7 C)   TempSrc:   Oral   SpO2: 100% 99% 98% 96%  Weight:      Height:       General: Not in acute distress HEENT:       Eyes: PERRL, EOMI, no scleral icterus.       ENT: No discharge from the ears and nose, no pharynx injection, no tonsillar enlargement.        Neck: No JVD, no bruit, no mass felt. Heme: No neck lymph node enlargement. Cardiac: S1/S2, RRR, No murmurs, No gallops or rubs. Respiratory: No rales, wheezing, rhonchi or rubs. GI: Soft, nondistended, nontender, no rebound pain, no organomegaly, BS  present. GU: No hematuria Ext: No pitting leg edema bilaterally. 1+DP/PT pulse bilaterally. Musculoskeletal: has tenderness in right hip Skin: No rashes.  Neuro: Alert, oriented X3, cranial nerves II-XII grossly intact, moves all extremities. Psych: Patient is not psychotic, no suicidal or hemocidal ideation.  Labs on Admission: I have personally reviewed following labs and imaging studies  CBC: Recent Labs  Lab 08/15/22 0836  WBC 10.5  NEUTROABS 8.2*  HGB 10.6*  HCT 31.7*  MCV 102.3*  PLT 148*   Basic Metabolic Panel: Recent Labs  Lab 08/15/22 0836  NA 139  K 3.8  CL 107  CO2 23  GLUCOSE 121*  BUN 31*  CREATININE 1.01*  CALCIUM 9.0   GFR: Estimated Creatinine Clearance: 27.3 mL/min (A) (by C-G formula based on SCr of 1.01 mg/dL (H)). Liver Function Tests: No results for input(s): "  AST", "ALT", "ALKPHOS", "BILITOT", "PROT", "ALBUMIN" in the last 168 hours. No results for input(s): "LIPASE", "AMYLASE" in the last 168 hours. No results for input(s): "AMMONIA" in the last 168 hours. Coagulation Profile: Recent Labs  Lab 08/15/22 1159  INR 1.3*   Cardiac Enzymes: No results for input(s): "CKTOTAL", "CKMB", "CKMBINDEX", "TROPONINI" in the last 168 hours. BNP (last 3 results) No results for input(s): "PROBNP" in the last 8760 hours. HbA1C: No results for input(s): "HGBA1C" in the last 72 hours. CBG: No results for input(s): "GLUCAP" in the last 168 hours. Lipid Profile: No results for input(s): "CHOL", "HDL", "LDLCALC", "TRIG", "CHOLHDL", "LDLDIRECT" in the last 72 hours. Thyroid Function Tests: No results for input(s): "TSH", "T4TOTAL", "FREET4", "T3FREE", "THYROIDAB" in the last 72 hours. Anemia Panel: No results for input(s): "VITAMINB12", "FOLATE", "FERRITIN", "TIBC", "IRON", "RETICCTPCT" in the last 72 hours. Urine analysis:    Component Value Date/Time   COLORURINE YELLOW (A) 01/07/2022 0930   APPEARANCEUR CLEAR (A) 01/07/2022 0930   LABSPEC 1.017  01/07/2022 0930   PHURINE 5.0 01/07/2022 0930   GLUCOSEU NEGATIVE 01/07/2022 0930   HGBUR NEGATIVE 01/07/2022 0930   BILIRUBINUR NEGATIVE 01/07/2022 0930   KETONESUR NEGATIVE 01/07/2022 0930   PROTEINUR NEGATIVE 01/07/2022 0930   NITRITE NEGATIVE 01/07/2022 0930   LEUKOCYTESUR NEGATIVE 01/07/2022 0930   Sepsis Labs: (procalcitonin:4,lacticidven:4) )No results found for this or any previous visit (from the past 240 hour(s)).   Radiological Exams on Admission: DG Chest 1 View  Result Date: 08/15/2022 CLINICAL DATA:  [id number] Fall, initial encounter [id number] EXAM: CHEST  1 VIEW COMPARISON:  01/06/2022 FINDINGS: The heart size and mediastinal contours are within normal limits. There is diffuse pulmonary interstitial prominence which could be seen with atypical infection, edema or interstitial lung disease. There is no focal consolidation. Aorta is calcified. The visualized skeletal structures are unremarkable. IMPRESSION: Nonspecific interstitial prominence consistent with atypical infection, edema or interstitial lung disease. No focal consolidation. Electronically Signed   By: Layla Maw M.D.   On: 08/15/2022 08:31   DG Tibia/Fibula Right  Result Date: 08/15/2022 CLINICAL DATA:  Fall EXAM: RIGHT TIBIA AND FIBULA - 2 VIEW COMPARISON:  None Available. FINDINGS: Osseous structures appear osteopenic. There is no evidence of fracture or other focal bone lesions. Soft tissues are unremarkable. IMPRESSION: Osteopenia.  No acute osseous abnormalities. Electronically Signed   By: Layla Maw M.D.   On: 08/15/2022 08:30   DG Femur Min 2 Views Right  Result Date: 08/15/2022 CLINICAL DATA:  Fall EXAM: RIGHT FEMUR 2 VIEWS; DG HIP (WITH OR WITHOUT PELVIS) 3V RIGHT COMPARISON:  None Available. FINDINGS: Osseous structures osteopenic. Patient is status post previous ORIF left femoral neck. Pelvic ring is intact. Lumbosacral degenerative changes are noted. There is an intertrochanteric  fracture right femoral neck with varus angulation and minimal displacement. Distal portions of the right femur are intact. IMPRESSION: Intertrochanteric right femoral neck fracture. Electronically Signed   By: Layla Maw M.D.   On: 08/15/2022 08:30   DG Hip Unilat W or Wo Pelvis 2-3 Views Right  Result Date: 08/15/2022 CLINICAL DATA:  Fall EXAM: RIGHT FEMUR 2 VIEWS; DG HIP (WITH OR WITHOUT PELVIS) 3V RIGHT COMPARISON:  None Available. FINDINGS: Osseous structures osteopenic. Patient is status post previous ORIF left femoral neck. Pelvic ring is intact. Lumbosacral degenerative changes are noted. There is an intertrochanteric fracture right femoral neck with varus angulation and minimal displacement. Distal portions of the right femur are intact. IMPRESSION: Intertrochanteric right femoral neck fracture. Electronically Signed  By: Layla Maw M.D.   On: 08/15/2022 08:30   DG Ankle 2 Views Right  Result Date: 08/15/2022 CLINICAL DATA:  Fall EXAM: RIGHT ANKLE - 2 VIEW COMPARISON:  None Available. FINDINGS: There is no evidence of fracture, dislocation, or joint effusion. There is no evidence of arthropathy or other focal bone abnormality. Soft tissues are unremarkable. Small plantar and large posterior calcaneal spurs are IMPRESSION: Calcaneal spurs.  No acute osseous abnormalities. Electronically Signed   By: Layla Maw M.D.   On: 08/15/2022 08:28   DG Knee Complete 4 Views Right  Result Date: 08/15/2022 CLINICAL DATA:  fall EXAM: RIGHT KNEE - COMPLETE 4 VIEW COMPARISON:  None Available. FINDINGS: No evidence of fracture, dislocation, or joint effusion. There is lateral compartment joint space narrowing, sclerosis and osteophytes consistent with degenerative joint disease. No evidence of effusion. IMPRESSION: Degenerative changes. Electronically Signed   By: Layla Maw M.D.   On: 08/15/2022 08:27   CT Cervical Spine Wo Contrast  Result Date: 08/15/2022 CLINICAL DATA:   88 year old female status post fall while going to bathroom. Pain. EXAM: CT CERVICAL SPINE WITHOUT CONTRAST TECHNIQUE: Multidetector CT imaging of the cervical spine was performed without intravenous contrast. Multiplanar CT image reconstructions were also generated. RADIATION DOSE REDUCTION: This exam was performed according to the departmental dose-optimization program which includes automated exposure control, adjustment of the mA and/or kV according to patient size and/or use of iterative reconstruction technique. COMPARISON:  Head CT today. FINDINGS: Alignment: Mild straightening of cervical lordosis. Mild degenerative appearing anterolisthesis of C5 on C6 with chronic facet degeneration on the left. Similar C7-T1 anterolisthesis with bilateral facet degeneration. Bilateral posterior element alignment is within normal limits. Skull base and vertebrae: Bone mineralization is within normal limits for age. Visualized skull base is intact. No atlanto-occipital dissociation. C1 and C2 appear intact and aligned. No acute osseous abnormality identified. Soft tissues and spinal canal: No prevertebral fluid or swelling. No visible canal hematoma. Bulky calcified cervical carotid atherosclerosis and mild motion artifact in the visible neck. Disc levels: C3-C4 degenerative appearing ankylosis including by of the left facet. Adjacent left side facet arthropathy at multiple levels. Chronic disc and endplate degeneration. But fairly capacious appearance of the cervical spinal canal by CT. Mild if any spinal stenosis suspected. Upper chest: Partially calcified apical lung scarring. Calcified aortic atherosclerosis. Grossly intact visible upper thoracic levels. IMPRESSION: 1. No acute traumatic injury identified in the cervical spine. 2. Fairly age-appropriate cervical spine degeneration with some degenerative spondylolisthesis and ankylosis. 3. Calcified cervical carotid atherosclerosis. Aortic Atherosclerosis (ICD10-I70.0).  Electronically Signed   By: Odessa Fleming M.D.   On: 08/15/2022 07:34   CT Head Wo Contrast  Result Date: 08/15/2022 CLINICAL DATA:  88 year old female status post fall while going to bathroom. Pain. EXAM: CT HEAD WITHOUT CONTRAST TECHNIQUE: Contiguous axial images were obtained from the base of the skull through the vertex without intravenous contrast. RADIATION DOSE REDUCTION: This exam was performed according to the departmental dose-optimization program which includes automated exposure control, adjustment of the mA and/or kV according to patient size and/or use of iterative reconstruction technique. COMPARISON:  Head CT 01/06/2022 and earlier. FINDINGS: Brain: Stable cerebral volume. No midline shift, ventriculomegaly, mass effect, evidence of mass lesion, intracranial hemorrhage or evidence of cortically based acute infarction. Stable gray-white matter differentiation throughout the brain. Vascular: Calcified atherosclerosis at the skull base. No suspicious intracranial vascular hyperdensity. Skull: Stable hyperostosis, intact. Sinuses/Orbits: Visualized paranasal sinuses and mastoids are stable and well aerated. Other: Broad-based  right posterior scalp hematoma measures up to 15 mm in thickness, roughly 5.5 cm diameter. No scalp soft tissue gas. Other scalp and orbits soft tissues appears stable. Underlying calvarium appears stable and intact. IMPRESSION: 1. Right posterior scalp hematoma without underlying skull fracture. 2. Stable non contrast CT appearance of the brain, no acute intracranial abnormality. Electronically Signed   By: Odessa Fleming M.D.   On: 08/15/2022 07:30      Assessment/Plan Principal Problem:   Closed right hip fracture [facility]) Active Problems:   Fall at home, initial encounter   Stage 3b chronic kidney disease (CKD) (HCC)   Iron deficiency anemia   Depression   Protein-calorie malnutrition, moderate (HCC)   Assessment and Plan:  Closed right hip fracture (HCC):  As evidenced by  x-ray. Patient has moderate pain now. No neurovascular compromise. Orthopedic surgeon, Dr. Allena Katz was consulted.   - will admit to Med-surg bed - Pain control: prn morphine, percocet and tyleno - When necessary Zofran for nausea - Robaxin for muscle spasm - Lidoderm patch for pain - type and cross - INR/PTT -PT/OT when able to (not ordered now) - NPO after MN for surgery tomorrow  Fall at home, initial encounter -fall precaution  Stage 3b chronic kidney disease (CKD) (HCC): Close to baseline -Follow-up with BMP  Iron deficiency anemia: Hemoglobin stable, 10.6 today -Continue iron supplement  Depression -Continue home medications  Protein-calorie malnutrition, moderate (HCC) -Consult nutrition     Perioperative Cardiac Risk: pt has multiple comorbidities as listed above, but no hx of CAD or CHF. No recent acute cardiac issues.  Patient does not have chest pain, shortness of breath, palpitation, leg edema.  No signs of acute CHF currently.  At this time point, no further work up is needed. Patient's GUPTA score perioperative myocardial infarction or cardaic arrest is 1.93 %.  I discussed the risk with patient and her son, pt would like to proceed for surgery.      DVT ppx: SCD  Code Status: DNR (I discussed with patient in the presence of her son, and explained the meaning of CODE STATUS. Patient wants to be DNR)  Family Communication:   Yes, patient's son   at bed side.     Disposition Plan:  Anticipate discharge back to previous environment  Consults called:  Dr. Allena Katz of Ortho is consulted.  Admission status and Level of care: Med-Surg:    as inpt       Dispo: The patient is from: Home              Anticipated d/c is to: Home              Anticipated d/c date is: 2 days              Patient currently is not medically stable to d/c.    Severity of Illness:  The appropriate patient status for this patient is INPATIENT. Inpatient status is judged to be  reasonable and necessary in order to provide the required intensity of service to ensure the patient's safety. The patient's presenting symptoms, physical exam findings, and initial radiographic and laboratory data in the context of their chronic comorbidities is felt to place them at high risk for further clinical deterioration. Furthermore, it is not anticipated that the patient will be medically stable for discharge from the hospital within 2 midnights of admission.   * I certify that at the point of admission it is my clinical judgment that the patient will require  inpatient hospital care spanning beyond 2 midnights from the point of admission due to high intensity of service, high risk for further deterioration and high frequency of surveillance required.*       Date of Service 08/15/2022    Lorretta Harp [facility]   If 7PM-7AM, please contact night-coverage [email or web address] 08/15/2022, 6:52 PM

## 2022-08-15 NOTE — ED Triage Notes (Signed)
First Nurse Note:  BIB [facility] from home   Pt had fall this AM going to bathroom. Reports she got tangled in wheelchair. C/o R knee pain. Denies LOC and no daily thinners. Sensation and pulses intact to R lower extremity.   HR 75 97% RA  161/74 RR 18

## 2022-08-15 NOTE — ED Triage Notes (Signed)
PT was going to the bathroom and got tripped up by her walker and fell. Pt hit back of her head while falling, dried blood on hair, no active bleeding. Denies loss of consciousness. Pt did have syncopal episode when being transferred to stretcher by EMS d/t pain per family. Pt A&Ox4. Pt complaining of increasing right leg pain since fall with recent surgical history on right hip. (-) blood thinners.

## 2022-08-15 NOTE — ED Provider Notes (Signed)
[facility] Provider Note    Event Date/Time   First MD Initiated Contact with Patient 08/15/22 [phone]     (approximate)   History   Fall   HPI  Cassie Solis is a 88 y.o. female   Past medical history of left hip fracture with operative repair on earlier this year, CKD, who presents with a fall after tripping on her walker hitting the back of her head, no loss of consciousness and no blood thinners.  She has right leg pain.  Otherwise has been in her regular state of health without recent illnesses and no other acute medical complaints.  History was obtained via the patient.      Physical Exam   Triage Vital Signs: ED Triage Vitals  Enc Vitals Group     BP 08/15/22 0638 (!) 180/86     Pulse Rate 08/15/22 0638 67     Resp 08/15/22 0638 (!) 22     Temp 08/15/22 0638 (!) 97.5 F (36.4 C)     Temp Source 08/15/22 0638 Oral     SpO2 08/15/22 0638 96 %     Weight 08/15/22 0639 105 lb (47.6 kg)     Height 08/15/22 0639 5\' 4"  (1.626 m)     Head Circumference --      Peak Flow --      Pain Score 08/15/22 0639 10     Pain Loc --      Pain Edu? --      Excl. in GC? --     Most recent vital signs: Vitals:   08/15/22 0638  BP: (!) 180/86  Pulse: 67  Resp: (!) 22  Temp: (!) 97.5 F (36.4 C)  SpO2: 96%    General: Awake, no distress.  CV:  Good peripheral perfusion.  Resp:  Normal effort.  Abd:  No distention.  Other:  Oriented, alert, awake and pleasant.  She has tenderness to the right and left hip.  Neurovascular intact to both lower extremity.  She is able to range both upper extremities with full active range of motion and there is no obvious trauma or tenderness to palpation to the thorax, back, abdomen.  She has a small hematoma to the occiput of the head and a very small superficial laceration to the occiput as well.   ED Results / Procedures / Treatments   Labs (all labs ordered are listed, but only abnormal results are  displayed) Labs Reviewed  CBC WITH DIFFERENTIAL/PLATELET - Abnormal; Notable for the following components:      Result Value   RBC 3.10 (*)    Hemoglobin 10.6 (*)    HCT 31.7 (*)    MCV 102.3 (*)    MCH 34.2 (*)    Platelets 148 (*)    Neutro Abs 8.2 (*)    All other components within normal limits  BASIC METABOLIC PANEL     I reviewed labs and they are notable for hemoglobin of 10.6 compared to 8.3 in the summertime.   RADIOLOGY I independently reviewed and interpreted CT of the head and see no obvious bleeding or midline shift   PROCEDURES:  Critical Care performed: No  Procedures   MEDICATIONS ORDERED IN ED: Medications  fentaNYL (SUBLIMAZE) injection 25 mcg (25 mcg Intravenous Given 08/15/22 0841)    Consultants:  I spoke with Dr. 08/17/22 of orthopedics regarding care plan for this patient.   IMPRESSION / MDM / ASSESSMENT AND PLAN / ED COURSE  I  reviewed the triage vital signs and the nursing notes.                              Differential diagnosis includes, but is not limited to, hip fracture, dislocation, intracranial bleeding, cervical spine fracture dislocation, other fractures or dislocations from blunt traumatic injury    MDM: This is a patient with mechanical slip and fall with a fracture of her right hip.  No other traumatic injuries noted besides a small laceration to the occiput not requiring repair and a negative head CT.  All other imaging is negative for acute traumatic injury.  Pain control with IV medications in the emergency department, got basic blood work for admission and orthopedics consultation with Dr. Allena Katz for surgical management.   Patient's presentation is most consistent with acute presentation with potential threat to life or bodily function.       FINAL CLINICAL IMPRESSION(S) / ED DIAGNOSES   Final diagnoses:  Fall, initial encounter  Closed right hip fracture, initial encounter [facility])  Traumatic hematoma of occiput, initial  encounter     Rx / DC Orders   ED Discharge Orders     None        Note:  This document was prepared using Dragon voice recognition software and may include unintentional dictation errors.    Pilar Jarvis, MD 08/15/22 [phone]

## 2022-08-16 ENCOUNTER — Inpatient Hospital Stay: Payer: PPO | Admitting: Anesthesiology

## 2022-08-16 ENCOUNTER — Other Ambulatory Visit: Payer: Self-pay

## 2022-08-16 ENCOUNTER — Inpatient Hospital Stay: Payer: PPO

## 2022-08-16 ENCOUNTER — Encounter
Admission: EM | Disposition: A | Discharge status: Skilled Nursing Facility | Payer: Self-pay | Source: Home / Self Care | Attending: Internal Medicine

## 2022-08-16 ENCOUNTER — Encounter: Payer: Self-pay | Admitting: Internal Medicine

## 2022-08-16 DIAGNOSIS — S72001A Fracture of unspecified part of neck of right femur, initial encounter for closed fracture: Secondary | ICD-10-CM | POA: Diagnosis not present

## 2022-08-16 DIAGNOSIS — W19XXXA Unspecified fall, initial encounter: Secondary | ICD-10-CM | POA: Diagnosis not present

## 2022-08-16 HISTORY — PX: INTRAMEDULLARY (IM) NAIL INTERTROCHANTERIC: SHX5875

## 2022-08-16 LAB — CBC
HCT: 26.9 % — ABNORMAL LOW (ref 36.0–46.0)
Hemoglobin: 8.7 g/dL — ABNORMAL LOW (ref 12.0–15.0)
MCH: 33.9 pg (ref 26.0–34.0)
MCHC: 32.3 g/dL (ref 30.0–36.0)
MCV: 104.7 fL — ABNORMAL HIGH (ref 80.0–100.0)
Platelets: 128 10*3/uL — ABNORMAL LOW (ref 150–400)
RBC: 2.57 MIL/uL — ABNORMAL LOW (ref 3.87–5.11)
RDW: 14.6 % (ref 11.5–15.5)
WBC: 6.7 10*3/uL (ref 4.0–10.5)
nRBC: 0 % (ref 0.0–0.2)

## 2022-08-16 LAB — SURGICAL PCR SCREEN
MRSA, PCR: NEGATIVE
Staphylococcus aureus: NEGATIVE

## 2022-08-16 LAB — BASIC METABOLIC PANEL
Anion gap: 4 — ABNORMAL LOW (ref 5–15)
BUN: 29 mg/dL — ABNORMAL HIGH (ref 8–23)
CO2: 27 mmol/L (ref 22–32)
Calcium: 8.6 mg/dL — ABNORMAL LOW (ref 8.9–10.3)
Chloride: 107 mmol/L (ref 98–111)
Creatinine, Ser: 0.89 mg/dL (ref 0.44–1.00)
GFR, Estimated: >60 mL/min (ref >60)
Glucose, Bld: 114 mg/dL — ABNORMAL HIGH (ref 70–99)
Potassium: 3.8 mmol/L (ref 3.5–5.1)
Sodium: 138 mmol/L (ref 135–145)

## 2022-08-16 SURGERY — FIXATION, FRACTURE, INTERTROCHANTERIC, WITH INTRAMEDULLARY ROD
Anesthesia: Spinal | Laterality: Right

## 2022-08-16 MED ORDER — FENTANYL CITRATE (PF) 100 MCG/2ML IJ SOLN
INTRAMUSCULAR | Status: DC | PRN
  Administered 2022-08-16 (×2): 25 ug via INTRAVENOUS

## 2022-08-16 MED ORDER — TRAMADOL HCL 50 MG PO TABS: 50.0000 mg | ORAL_TABLET | Freq: Four times a day (QID) | ORAL | Status: DC | PRN

## 2022-08-16 MED ORDER — KETOROLAC TROMETHAMINE 15 MG/ML IJ SOLN
7.5000 mg | Freq: Four times a day (QID) | INTRAMUSCULAR | Status: DC
  Administered 2022-08-16 – 2022-08-17 (×2): 7.5 mg via INTRAVENOUS
  Filled 2022-08-16 (×3): qty 1

## 2022-08-16 MED ORDER — ONDANSETRON HCL 4 MG/2ML IJ SOLN: 4.0000 mg | Freq: Once | INTRAMUSCULAR | Status: DC | PRN

## 2022-08-16 MED ORDER — HYDROCODONE-ACETAMINOPHEN 7.5-325 MG PO TABS
1.0000 | ORAL_TABLET | Freq: Once | ORAL | Status: DC | PRN
  Filled 2022-08-16: qty 1

## 2022-08-16 MED ORDER — ONDANSETRON HCL 4 MG/2ML IJ SOLN: 4.0000 mg | Freq: Four times a day (QID) | INTRAMUSCULAR | Status: DC | PRN

## 2022-08-16 MED ORDER — SENNOSIDES-DOCUSATE SODIUM 8.6-50 MG PO TABS: 1.0000 | ORAL_TABLET | Freq: Every evening | ORAL | Status: DC | PRN

## 2022-08-16 MED ORDER — PHENYLEPHRINE 80 MCG/ML (10ML) SYRINGE FOR IV PUSH (FOR BLOOD PRESSURE SUPPORT)
PREFILLED_SYRINGE | INTRAVENOUS | Status: DC | PRN
  Administered 2022-08-16 (×2): 80 ug via INTRAVENOUS

## 2022-08-16 MED ORDER — BUPIVACAINE HCL (PF) 0.5 % IJ SOLN
INTRAMUSCULAR | Status: AC
  Filled 2022-08-16: qty 30

## 2022-08-16 MED ORDER — BUPIVACAINE LIPOSOME 1.3 % IJ SUSP
INTRAMUSCULAR | Status: AC
  Filled 2022-08-16: qty 20

## 2022-08-16 MED ORDER — FENTANYL CITRATE (PF) 100 MCG/2ML IJ SOLN
INTRAMUSCULAR | Status: AC
  Filled 2022-08-16: qty 2

## 2022-08-16 MED ORDER — 0.9 % SODIUM CHLORIDE (POUR BTL) OPTIME
TOPICAL | Status: DC | PRN
  Administered 2022-08-16: 500 mL

## 2022-08-16 MED ORDER — ONDANSETRON HCL 4 MG/2ML IJ SOLN
INTRAMUSCULAR | Status: DC | PRN
  Administered 2022-08-16: 4 mg via INTRAVENOUS

## 2022-08-16 MED ORDER — PROPOFOL 500 MG/50ML IV EMUL
INTRAVENOUS | Status: DC | PRN
  Administered 2022-08-16: 50 ug/kg/min via INTRAVENOUS

## 2022-08-16 MED ORDER — ORAL CARE MOUTH RINSE: 15.0000 mL | OROMUCOSAL | Status: DC | PRN

## 2022-08-16 MED ORDER — ACETAMINOPHEN 10 MG/ML IV SOLN
INTRAVENOUS | Status: DC | PRN
  Administered 2022-08-16: 500 mg via INTRAVENOUS

## 2022-08-16 MED ORDER — DOCUSATE SODIUM 100 MG PO CAPS
100.0000 mg | ORAL_CAPSULE | Freq: Two times a day (BID) | ORAL | Status: DC
  Administered 2022-08-16 – 2022-08-20 (×8): 100 mg via ORAL
  Filled 2022-08-16 (×8): qty 1

## 2022-08-16 MED ORDER — ADULT MULTIVITAMIN W/MINERALS CH: 1.0000 | ORAL_TABLET | Freq: Every day | ORAL | Status: DC

## 2022-08-16 MED ORDER — METHOCARBAMOL 500 MG PO TABS
500.0000 mg | ORAL_TABLET | Freq: Four times a day (QID) | ORAL | Status: DC | PRN
  Administered 2022-08-17 (×2): 500 mg via ORAL

## 2022-08-16 MED ORDER — METOCLOPRAMIDE HCL 5 MG/ML IJ SOLN: 5.0000 mg | Freq: Three times a day (TID) | INTRAMUSCULAR | Status: DC | PRN

## 2022-08-16 MED ORDER — HYDROMORPHONE HCL 1 MG/ML IJ SOLN: 0.2000 mg | INTRAMUSCULAR | Status: DC | PRN

## 2022-08-16 MED ORDER — ENOXAPARIN SODIUM 40 MG/0.4ML IJ SOSY
40.0000 mg | PREFILLED_SYRINGE | INTRAMUSCULAR | Status: DC
  Administered 2022-08-17 – 2022-08-20 (×4): 40 mg via SUBCUTANEOUS
  Filled 2022-08-16 (×4): qty 0.4

## 2022-08-16 MED ORDER — OXYCODONE HCL 5 MG PO TABS: 2.5000 mg | ORAL_TABLET | ORAL | Status: DC | PRN

## 2022-08-16 MED ORDER — PROPOFOL 10 MG/ML IV BOLUS
INTRAVENOUS | Status: DC | PRN
  Administered 2022-08-16: 30 mg via INTRAVENOUS
  Administered 2022-08-16: 10 mg via INTRAVENOUS

## 2022-08-16 MED ORDER — BUPIVACAINE HCL (PF) 0.5 % IJ SOLN
INTRAMUSCULAR | Status: DC | PRN
  Administered 2022-08-16: 2.2 mL

## 2022-08-16 MED ORDER — FLEET ENEMA 7-19 GM/118ML RE ENEM: 1.0000 | ENEMA | Freq: Once | RECTAL | Status: DC | PRN

## 2022-08-16 MED ORDER — PHENYLEPHRINE HCL-NACL 20-0.9 MG/250ML-% IV SOLN
INTRAVENOUS | Status: DC | PRN
  Administered 2022-08-16: 40 ug/min via INTRAVENOUS

## 2022-08-16 MED ORDER — ACETAMINOPHEN 500 MG PO TABS
1000.0000 mg | ORAL_TABLET | Freq: Three times a day (TID) | ORAL | Status: DC
  Administered 2022-08-16 – 2022-08-20 (×11): 1000 mg via ORAL
  Filled 2022-08-16 (×11): qty 2

## 2022-08-16 MED ORDER — METOCLOPRAMIDE HCL 5 MG PO TABS
5.0000 mg | ORAL_TABLET | Freq: Three times a day (TID) | ORAL | Status: DC | PRN
  Administered 2022-08-20: 5 mg via ORAL
  Filled 2022-08-16: qty 1

## 2022-08-16 MED ORDER — ACETAMINOPHEN 325 MG PO TABS: 325.0000 mg | ORAL_TABLET | ORAL | Status: DC | PRN

## 2022-08-16 MED ORDER — ACETAMINOPHEN 10 MG/ML IV SOLN
INTRAVENOUS | Status: AC
  Filled 2022-08-16: qty 100

## 2022-08-16 MED ORDER — ONDANSETRON HCL 4 MG PO TABS: 4.0000 mg | ORAL_TABLET | Freq: Four times a day (QID) | ORAL | Status: DC | PRN

## 2022-08-16 MED ORDER — CEFAZOLIN SODIUM-DEXTROSE 2-4 GM/100ML-% IV SOLN
INTRAVENOUS | Status: AC
  Filled 2022-08-16: qty 100

## 2022-08-16 MED ORDER — ACETAMINOPHEN 160 MG/5ML PO SOLN: 325.0000 mg | ORAL | Status: DC | PRN

## 2022-08-16 MED ORDER — BISACODYL 10 MG RE SUPP
10.0000 mg | Freq: Every day | RECTAL | Status: DC | PRN
  Administered 2022-08-20: 10 mg via RECTAL
  Filled 2022-08-16: qty 1

## 2022-08-16 MED ORDER — EPHEDRINE SULFATE (PRESSORS) 50 MG/ML IJ SOLN
INTRAMUSCULAR | Status: DC | PRN
  Administered 2022-08-16: 2.5 mg via INTRAVENOUS

## 2022-08-16 MED ORDER — OXYCODONE HCL 5 MG PO TABS
5.0000 mg | ORAL_TABLET | ORAL | Status: DC | PRN
  Administered 2022-08-16: 5 mg via ORAL
  Administered 2022-08-17: 10 mg via ORAL
  Administered 2022-08-18 (×2): 5 mg via ORAL
  Filled 2022-08-16: qty 1
  Filled 2022-08-16: qty 2
  Filled 2022-08-16 (×2): qty 1

## 2022-08-16 MED ORDER — CEFAZOLIN SODIUM-DEXTROSE 1-4 GM/50ML-% IV SOLN
1.0000 g | Freq: Four times a day (QID) | INTRAVENOUS | Status: AC
  Administered 2022-08-16 – 2022-08-17 (×3): 1 g via INTRAVENOUS
  Filled 2022-08-16 (×3): qty 50

## 2022-08-16 MED ORDER — ENSURE ENLIVE PO LIQD
237.0000 mL | Freq: Two times a day (BID) | ORAL | Status: DC
  Filled 2022-08-16 (×2): qty 237

## 2022-08-16 MED ORDER — TRANEXAMIC ACID-NACL 1000-0.7 MG/100ML-% IV SOLN
INTRAVENOUS | Status: AC
  Filled 2022-08-16: qty 100

## 2022-08-16 MED ORDER — ACETAMINOPHEN 10 MG/ML IV SOLN: INTRAVENOUS | Status: DC | PRN

## 2022-08-16 MED ORDER — SODIUM CHLORIDE 0.9 % IV SOLN: INTRAVENOUS | Status: DC

## 2022-08-16 MED ORDER — BUPIVACAINE LIPOSOME 1.3 % IJ SUSP
INTRAMUSCULAR | Status: DC | PRN
  Administered 2022-08-16: 50 mL

## 2022-08-16 MED ORDER — METHOCARBAMOL 1000 MG/10ML IJ SOLN: 500.0000 mg | Freq: Four times a day (QID) | INTRAVENOUS | Status: DC | PRN

## 2022-08-16 SURGICAL SUPPLY — 41 items
BIT DRILL INTERTAN LAG SCREW (BIT) IMPLANT
BIT DRILL LONG 4.0 (BIT) IMPLANT
BLADE SURG 15 STRL LF DISP TIS (BLADE) ×1 IMPLANT
BLADE SURG 15 STRL SS (BLADE) ×1
CHLORAPREP W/TINT 26 (MISCELLANEOUS) ×1 IMPLANT
DRAPE 3/4 80X56 (DRAPES) ×1 IMPLANT
DRAPE SURG 17X11 SM STRL (DRAPES) ×2 IMPLANT
DRAPE U-SHAPE 47X51 STRL (DRAPES) ×2 IMPLANT
DRILL BIT LONG 4.0 (BIT) ×1
DRSG OPSITE POSTOP 3X4 (GAUZE/BANDAGES/DRESSINGS) ×3 IMPLANT
DRSG OPSITE POSTOP 4X6 (GAUZE/BANDAGES/DRESSINGS) IMPLANT
ELECT REM PT RETURN 9FT ADLT (ELECTROSURGICAL) ×1
ELECTRODE REM PT RTRN 9FT ADLT (ELECTROSURGICAL) ×1 IMPLANT
GLOVE BIOGEL PI IND STRL 8 (GLOVE) ×1 IMPLANT
GLOVE SURG SYN 7.5  E (GLOVE) ×1
GLOVE SURG SYN 7.5 E (GLOVE) ×1 IMPLANT
GLOVE SURG SYN 7.5 PF PI (GLOVE) ×1 IMPLANT
GOWN STRL REUS W/ TWL LRG LVL3 (GOWN DISPOSABLE) ×1 IMPLANT
GOWN STRL REUS W/ TWL XL LVL3 (GOWN DISPOSABLE) ×1 IMPLANT
GOWN STRL REUS W/TWL LRG LVL3 (GOWN DISPOSABLE) ×1
GOWN STRL REUS W/TWL XL LVL3 (GOWN DISPOSABLE) ×1
GUIDE PIN 3.2X343 (PIN) ×2
GUIDE PIN 3.2X343MM (PIN) ×2
KIT PATIENT CARE HANA TABLE (KITS) ×1 IMPLANT
KIT TURNOVER KIT A (KITS) ×1 IMPLANT
MANIFOLD NEPTUNE II (INSTRUMENTS) ×1 IMPLANT
MAT ABSORB  FLUID 56X50 GRAY (MISCELLANEOUS) ×1
MAT ABSORB FLUID 56X50 GRAY (MISCELLANEOUS) ×2 IMPLANT
NAIL TRIGEN INTERTAN 10X18CM (Nail) IMPLANT
NDL FILTER BLUNT 18X1 1/2 (NEEDLE) ×1 IMPLANT
NEEDLE FILTER BLUNT 18X1 1/2 (NEEDLE) ×1 IMPLANT
NEEDLE HYPO 22GX1.5 SAFETY (NEEDLE) ×1 IMPLANT
NS IRRIG 1000ML POUR BTL (IV SOLUTION) ×1 IMPLANT
PACK HIP COMPR (MISCELLANEOUS) ×1 IMPLANT
PIN GUIDE 3.2X343MM (PIN) IMPLANT
SCREW LAG COMPR KIT 95/90 (Screw) IMPLANT
SCREW TRIGEN LOW PROF 5.0X30 (Screw) IMPLANT
STAPLER SKIN PROX 35W (STAPLE) ×1 IMPLANT
SUT VIC AB 2-0 CT2 27 (SUTURE) ×1 IMPLANT
TRAP FLUID SMOKE EVACUATOR (MISCELLANEOUS) ×1 IMPLANT
WATER STERILE IRR 500ML POUR (IV SOLUTION) ×1 IMPLANT

## 2022-08-16 NOTE — Progress Notes (Signed)
Initial Nutrition Assessment  DOCUMENTATION CODES:   Underweight  INTERVENTION:   -Once diet is advanced, add:   -Ensure Enlive po BID, each supplement provides 350 kcal and 20 grams of protein -MVI with minerals daily  NUTRITION DIAGNOSIS:   Increased nutrient needs related to post-op healing as evidenced by estimated needs.  GOAL:   Patient will meet greater than or equal to 90% of their needs  MONITOR:   PO intake, Supplement acceptance, Diet advancement  REASON FOR ASSESSMENT:   Consult Assessment of nutrition requirement/status, Hip fracture protocol  ASSESSMENT:   Pt with medical history significant of syncope, vertigo, CKD-3B, anemia, sinus bradycardia, orthostatic hypotension, left hip fracture, who presents with fall and right hip pain.  Pt admitted with closed rt hip fracture.   Reviewed I/O's: +691 ml x 24 hours  UOP: 250 ml x 24 hours  Plan for surgery today per orthopedics; pt currently NPO for procedure.   Pt unavailable at time of visit. Pt down in OR at time of visit. RD unable to obtain further nutrition-related history or complete nutrition-focused physical exam at this time.    Reviewed wt hx; wt has been stable over the past 6 months.    Medications reviewed and include ferrous sulfate, remeron, and 0.9% sodium chloride infusion @ 75 ml/hr.   Labs reviewed.    Diet Order:   Diet Order             Diet NPO time specified Except for: Sips with Meds, Ice Chips  Diet effective midnight                   EDUCATION NEEDS:   No education needs have been identified at this time  Skin:  Skin Assessment: Reviewed RN Assessment  Last BM:  08/15/22  Height:   Ht Readings from Last 1 Encounters:  08/16/22 5\' 4"  (1.626 m)    Weight:   Wt Readings from Last 1 Encounters:  08/16/22 47.6 kg    Ideal Body Weight:  54.5 kg  BMI:  Body mass index is 18.01 kg/m.  Estimated Nutritional Needs:   Kcal:  1450-1650  Protein:  70-85  grams  Fluid:  > 1.4 L    08/18/22, RD, LDN, CDCES Registered Dietitian II Certified Diabetes Care and Education Specialist Please refer to [facility] for RD and/or RD on-call/weekend/after hours pager

## 2022-08-16 NOTE — Transfer of Care (Signed)
Immediate Anesthesia Transfer of Care Note  Patient: Cassie Solis  Procedure(s) Performed: INTRAMEDULLARY (IM) NAIL INTERTROCHANTERIC (Right)  Patient Location: PACU  Anesthesia Type:Spinal  Level of Consciousness: drowsy  Airway & Oxygen Therapy: Patient Spontanous Breathing and Patient connected to face mask oxygen  Post-op Assessment: Report given to RN and Post -op Vital signs reviewed and stable  Post vital signs: Reviewed and stable  Last Vitals:  Vitals Value Taken Time  BP 128/74 08/16/22 1632  Temp    Pulse 83 08/16/22 1635  Resp 14 08/16/22 1635  SpO2 100 % 08/16/22 1635  Vitals shown include unvalidated device data.  Last Pain:  Vitals:   08/16/22 1310  TempSrc: Temporal  PainSc: 3       Patients Stated Pain Goal: 3 (08/15/22 1810)  Complications: No notable events documented.

## 2022-08-16 NOTE — TOC Initial Note (Signed)
Transition of Care [facility]) - Initial/Assessment Note    Patient Details  Name: Cassie Solis MRN: [id number] Date of Birth: 1928-09-21  [facility]) CM/SW Contact:    Marlowe Sax, RN Phone Number: 08/16/2022, 8:23 AM  Clinical Narrative:      THE patient comes from home where she uses a walker for aid in ambulation, Surgery planned today, TOC to follow and assist with DC planning                   Patient Goals and CMS Choice        Expected Discharge Plan and Services                                                Prior Living Arrangements/Services                       Activities of Daily Living Home Assistive Devices/Equipment: Dan Humphreys (specify type) ADL Screening (condition at time of admission) Patient's cognitive ability adequate to safely complete daily activities?: Yes Is the patient deaf or have difficulty hearing?: No Does the patient have difficulty seeing, even when wearing glasses/contacts?: Yes (mascular degeneration) Does the patient have difficulty concentrating, remembering, or making decisions?: Yes Patient able to express need for assistance with ADLs?: Yes Does the patient have difficulty dressing or bathing?: No (has sitter to help when needed) Independently performs ADLs?: Yes (appropriate for developmental age) Does the patient have difficulty walking or climbing stairs?: Yes Weakness of Legs: None Weakness of Arms/Hands: None  Permission Sought/Granted                  Emotional Assessment              Admission diagnosis:  Pre-operative cardiovascular examination [Z01.810] Closed right hip fracture (HCC) [S72.001A] Fall, initial encounter [W19.XXXA] Traumatic hematoma of occiput, initial encounter [S00.83XA] Closed right hip fracture, initial encounter (HCC) [S72.001A] Patient Active Problem List   Diagnosis Date Noted   Closed right hip fracture (HCC) 08/15/2022   Fall at home, initial  encounter 08/15/2022   Protein-calorie malnutrition, moderate (HCC) 08/15/2022   Iron deficiency anemia 08/15/2022   Depression 08/15/2022   S/P ORIF (open reduction internal fixation) fracture 02/26/2022   Adnexal mass 01/11/2022   Acute blood loss anemia    Femur fracture, left (HCC) 01/07/2022   Stage 3b chronic kidney disease (CKD) (HCC) 01/07/2022   Lactic acidosis 01/07/2022   Closed hip fracture requiring operative repair, left, sequela 01/06/2022   Orthostatic hypotension 01/06/2022   Sinus bradycardia 01/06/2022   PCP:  Hamrick, Durward Fortes, MD Pharmacy:   [facility] - [place], [place] - [address] [place] [address] Phone: [phone] Fax: [phone]     Social Determinants of Health (SDOH) Interventions Housing Interventions: Intervention Not Indicated  Readmission Risk Interventions    01/08/2022    1:46 PM  Readmission Risk Prevention Plan  Transportation Screening Complete  PCP or Specialist Appt within 5-7 Days Complete  Home Care Screening Complete  Medication Review (RN CM) Complete

## 2022-08-16 NOTE — Progress Notes (Signed)
Pt spinal has progressed slowly but improvement noted since arrival to pacu.  Hemodynamically stable.  Ok per dr Laural Benes to go to floor. Pt alert and oriented at this time.

## 2022-08-16 NOTE — Anesthesia Postprocedure Evaluation (Signed)
Anesthesia Post Note  Patient: Cassie Solis  Procedure(s) Performed: INTRAMEDULLARY (IM) NAIL INTERTROCHANTERIC (Right)  Patient location during evaluation: PACU Anesthesia Type: Spinal Level of consciousness: awake and alert Pain management: pain level controlled Vital Signs Assessment: post-procedure vital signs reviewed and stable Respiratory status: spontaneous breathing, nonlabored ventilation and respiratory function stable Cardiovascular status: blood pressure returned to baseline and stable Postop Assessment: no apparent nausea or vomiting and spinal receding Anesthetic complications: no   No notable events documented.   Last Vitals:  Vitals:   08/16/22 1815 08/16/22 1837  BP:  97/61  Pulse:  77  Resp:  18  Temp: 36.4 C (!) 36.4 C  SpO2:  94%    Last Pain:  Vitals:   08/16/22 1843  TempSrc:   PainSc: 0-No pain                 Foye Deer

## 2022-08-16 NOTE — Anesthesia Procedure Notes (Signed)
Spinal  Patient location during procedure: OR Start time: 08/16/2022 3:17 PM End time: 08/16/2022 3:27 PM Reason for block: surgical anesthesia Staffing Performed: anesthesiologist  Anesthesiologist: Iran Ouch, MD Resident/CRNA: Norm Salt, CRNA Performed by: Lily Peer, Angeliyah Kirkey, CRNA Authorized by: Iran Ouch, MD   Preanesthetic Checklist Completed: patient identified, IV checked, site marked, risks and benefits discussed, surgical consent, monitors and equipment checked and pre-op evaluation Spinal Block Patient position: left lateral decubitus Prep: ChloraPrep Patient monitoring: heart rate, continuous pulse ox, blood pressure and cardiac monitor Approach: midline Location: L3-4 Injection technique: single-shot Needle Needle type: Pencan  Needle gauge: 24 G Needle length: 10 cm Assessment Events: CSF return Additional Notes IV functioning, monitors applied to pt. Expiration date of kit checked and confirmed to be in date. Sterile prep and drape, hand hygiene and sterile gloved used. Pt was positioned and spine was prepped in sterile fashion. Skin was anesthetized with lidocaine. Free flow of clear CSF obtained prior to injecting local anesthetic into CSF x 1 attempt. Spinal needle aspirated freely following injection. Needle was carefully withdrawn, and pt tolerated procedure well. Loss of motor and sensory on exam post injection.

## 2022-08-16 NOTE — H&P (Signed)
H&P reviewed. No significant changes noted.  

## 2022-08-16 NOTE — Op Note (Signed)
DATE OF SURGERY: 08/16/2022  PREOPERATIVE DIAGNOSIS: Right intertrochanteric hip fracture  POSTOPERATIVE DIAGNOSIS: Right intertrochanteric hip fracture  PROCEDURE: Intramedullary nailing of Right femur with cephalomedullary device  SURGEON: Rosealee Albee, MD  ANESTHESIA: spinal  EBL: 100 cc  IVF: per anesthesia record  COMPONENTS:  [employer] Trigen Intertan Short Nail: 10x181mm; 67mm lag screw with 66mm compression screw; 5x 84mm distal cortical interlocking screw  INDICATIONS: Cassie Solis is a 88 y.o. female who sustained an intertrochanteric fracture after a fall. Risks and benefits of intramedullary nailing were explained to the patient and/or family . Risks include but are not limited to bleeding, infection, injury to tissues, nerves, vessels, nonunion/malunion, hardware failure, limb length discrepancy/hip rotation mismatch and risks of anesthesia. The patient and/or family understand these risks, have completed an informed consent, and wish to proceed.   PROCEDURE:  The patient was brought into the operating room. After administering anesthesia, the patient was placed in the supine position on the Hana table. The uninjured leg was placed in an extended position while the injured lower extremity was placed in longitudinal traction. The fracture was reduced using longitudinal traction and internal rotation. The adequacy of reduction was verified fluoroscopically in AP and lateral projections and found to be acceptable. The lateral aspect of the hip and thigh were prepped with ChloraPrep solution before being draped sterilely. Preoperative IV antibiotics were administered. A timeout was performed to verify the appropriate surgical site, patient, and procedure.    The greater trochanter was identified and an approximately 5 cm incision was made about 3 fingerbreadths above the tip of the greater trochanter. The incision was carried down through the subcutaneous tissues to expose  the gluteal fascia. This was split the length of the incision, providing access to the tip of the trochanter. Under fluoroscopic guidance, a guidewire was drilled through the tip of the trochanter into the proximal metaphysis to the level of the lesser trochanter. After verifying its position fluoroscopically in AP and lateral projections, it was overreamed with the opening reamer to the level of the lesser trochanter. The nail was selected and advanced to the appropriate depth as verified fluoroscopically.    The guide system for the lag screw was positioned and advanced through an approximately 5cm incision over the lateral aspect of the proximal femur. The guidewire was drilled up through the femoral nail and into the femoral neck to rest within 5 mm of subchondral bone. After verifying its position in the femoral neck and head in both AP and lateral projections, the guidewire was measured and appropriate sized lag screw was selected.  The channel for the compression screw was drilled and antirotation bar was placed.  Lag screw was drilled and placed in appropriate position.  Compression screw was then placed.  Appropriate compression was achieved.  The set screw was locked in place. Again, the adequacy of hardware position and fracture reduction was verified fluoroscopically in AP and lateral projections.   Attention was then turned to the distal interlocking screw in the diaphysis. Using a targeted assembly, a stab incision was made and hole was drilled through the nail. An interlocking screw was placed with excellent purchase.  Appropriate screw position was verified fluoroscopically in AP and lateral projections.   The wounds were irrigated thoroughly with sterile saline solution. Local anesthetic was injected into the wounds. The subcutaneous tissues were closed using 2-0 Vicryl interrupted sutures. The skin was closed using staples. Sterile occlusive dressings were applied to all wounds. The patient  was then transferred to the recovery room in satisfactory condition.   POSTOPERATIVE PLAN: The patient will be WBAT on the operative extremity. Lovenox 40mg /day x 4 weeks to start on POD#1. Perioperative IV antibiotics x 24 hours. PT/OT on POD#1.

## 2022-08-16 NOTE — Progress Notes (Signed)
Patient to OR

## 2022-08-16 NOTE — Progress Notes (Signed)
[facility]  - [place] at [facility]   PATIENT NAME: Cassie Solis    MR#:  [id number]  DATE OF BIRTH:  [birth year]  SUBJECTIVE:  son at bedside. Patient came in after she had mechanical fall while you're trying to use the bathroom early hours of the morning yesterday. Currently does not have much pain as long as she does not move. Surgery scheduled for this afternoon. No chest pain.    VITALS:  Blood pressure (!) 150/78, pulse 76, temperature 97.6 F (36.4 C), resp. rate 20, height 5\' 4"  (1.626 m), weight 47.6 kg, SpO2 100 %.  PHYSICAL EXAMINATION:   GENERAL:  88 y.o.-year-old patient lying in the bed with no acute distress.  LUNGS: Normal breath sounds bilaterally, no wheezing CARDIOVASCULAR: S1, S2 normal. No murmur   ABDOMEN: Soft, nontender, nondistended. Bowel sounds present.  EXTREMITIES:   decreased Right Le ROM NEUROLOGIC: nonfocal  patient is alert and awake SKIN: No obvious rash, lesion, or ulcer.   LABORATORY PANEL:  CBC Recent Labs  Lab 08/16/22 0521  WBC 6.7  HGB 8.7*  HCT 26.9*  PLT 128*    Chemistries  Recent Labs  Lab 08/16/22 0521  NA 138  K 3.8  CL 107  CO2 27  GLUCOSE 114*  BUN 29*  CREATININE 0.89  CALCIUM 8.6*   Cardiac Enzymes No results for input(s): "TROPONINI" in the last 168 hours. RADIOLOGY:  DG Chest 1 View  Result Date: 08/15/2022 CLINICAL DATA:  08/17/2022 Fall, initial encounter [id number] EXAM: CHEST  1 VIEW COMPARISON:  01/06/2022 FINDINGS: The heart size and mediastinal contours are within normal limits. There is diffuse pulmonary interstitial prominence which could be seen with atypical infection, edema or interstitial lung disease. There is no focal consolidation. Aorta is calcified. The visualized skeletal structures are unremarkable. IMPRESSION: Nonspecific interstitial prominence consistent with atypical infection, edema or interstitial lung disease. No focal consolidation. Electronically Signed   By: 03/08/2022 M.D.   On: 08/15/2022 08:31   DG Tibia/Fibula Right  Result Date: 08/15/2022 CLINICAL DATA:  Fall EXAM: RIGHT TIBIA AND FIBULA - 2 VIEW COMPARISON:  None Available. FINDINGS: Osseous structures appear osteopenic. There is no evidence of fracture or other focal bone lesions. Soft tissues are unremarkable. IMPRESSION: Osteopenia.  No acute osseous abnormalities. Electronically Signed   By: 08/17/2022 M.D.   On: 08/15/2022 08:30   DG Femur Min 2 Views Right  Result Date: 08/15/2022 CLINICAL DATA:  Fall EXAM: RIGHT FEMUR 2 VIEWS; DG HIP (WITH OR WITHOUT PELVIS) 3V RIGHT COMPARISON:  None Available. FINDINGS: Osseous structures osteopenic. Patient is status post previous ORIF left femoral neck. Pelvic ring is intact. Lumbosacral degenerative changes are noted. There is an intertrochanteric fracture right femoral neck with varus angulation and minimal displacement. Distal portions of the right femur are intact. IMPRESSION: Intertrochanteric right femoral neck fracture. Electronically Signed   By: 08/17/2022 M.D.   On: 08/15/2022 08:30   DG Hip Unilat W or Wo Pelvis 2-3 Views Right  Result Date: 08/15/2022 CLINICAL DATA:  Fall EXAM: RIGHT FEMUR 2 VIEWS; DG HIP (WITH OR WITHOUT PELVIS) 3V RIGHT COMPARISON:  None Available. FINDINGS: Osseous structures osteopenic. Patient is status post previous ORIF left femoral neck. Pelvic ring is intact. Lumbosacral degenerative changes are noted. There is an intertrochanteric fracture right femoral neck with varus angulation and minimal displacement. Distal portions of the right femur are intact. IMPRESSION: Intertrochanteric right femoral neck fracture. Electronically Signed   By: 08/17/2022  M.D.   On: 08/15/2022 08:30   DG Ankle 2 Views Right  Result Date: 08/15/2022 CLINICAL DATA:  Fall EXAM: RIGHT ANKLE - 2 VIEW COMPARISON:  None Available. FINDINGS: There is no evidence of fracture, dislocation, or joint effusion. There is no evidence  of arthropathy or other focal bone abnormality. Soft tissues are unremarkable. Small plantar and large posterior calcaneal spurs are IMPRESSION: Calcaneal spurs.  No acute osseous abnormalities. Electronically Signed   By: Layla Maw M.D.   On: 08/15/2022 08:28   DG Knee Complete 4 Views Right  Result Date: 08/15/2022 CLINICAL DATA:  fall EXAM: RIGHT KNEE - COMPLETE 4 VIEW COMPARISON:  None Available. FINDINGS: No evidence of fracture, dislocation, or joint effusion. There is lateral compartment joint space narrowing, sclerosis and osteophytes consistent with degenerative joint disease. No evidence of effusion. IMPRESSION: Degenerative changes. Electronically Signed   By: Layla Maw M.D.   On: 08/15/2022 08:27   CT Cervical Spine Wo Contrast  Result Date: 08/15/2022 CLINICAL DATA:  88 year old female status post fall while going to bathroom. Pain. EXAM: CT CERVICAL SPINE WITHOUT CONTRAST TECHNIQUE: Multidetector CT imaging of the cervical spine was performed without intravenous contrast. Multiplanar CT image reconstructions were also generated. RADIATION DOSE REDUCTION: This exam was performed according to the departmental dose-optimization program which includes automated exposure control, adjustment of the mA and/or kV according to patient size and/or use of iterative reconstruction technique. COMPARISON:  Head CT today. FINDINGS: Alignment: Mild straightening of cervical lordosis. Mild degenerative appearing anterolisthesis of C5 on C6 with chronic facet degeneration on the left. Similar C7-T1 anterolisthesis with bilateral facet degeneration. Bilateral posterior element alignment is within normal limits. Skull base and vertebrae: Bone mineralization is within normal limits for age. Visualized skull base is intact. No atlanto-occipital dissociation. C1 and C2 appear intact and aligned. No acute osseous abnormality identified. Soft tissues and spinal canal: No prevertebral fluid or swelling.  No visible canal hematoma. Bulky calcified cervical carotid atherosclerosis and mild motion artifact in the visible neck. Disc levels: C3-C4 degenerative appearing ankylosis including by of the left facet. Adjacent left side facet arthropathy at multiple levels. Chronic disc and endplate degeneration. But fairly capacious appearance of the cervical spinal canal by CT. Mild if any spinal stenosis suspected. Upper chest: Partially calcified apical lung scarring. Calcified aortic atherosclerosis. Grossly intact visible upper thoracic levels. IMPRESSION: 1. No acute traumatic injury identified in the cervical spine. 2. Fairly age-appropriate cervical spine degeneration with some degenerative spondylolisthesis and ankylosis. 3. Calcified cervical carotid atherosclerosis. Aortic Atherosclerosis (ICD10-I70.0). Electronically Signed   By: Odessa Fleming M.D.   On: 08/15/2022 07:34   CT Head Wo Contrast  Result Date: 08/15/2022 CLINICAL DATA:  88 year old female status post fall while going to bathroom. Pain. EXAM: CT HEAD WITHOUT CONTRAST TECHNIQUE: Contiguous axial images were obtained from the base of the skull through the vertex without intravenous contrast. RADIATION DOSE REDUCTION: This exam was performed according to the departmental dose-optimization program which includes automated exposure control, adjustment of the mA and/or kV according to patient size and/or use of iterative reconstruction technique. COMPARISON:  Head CT 01/06/2022 and earlier. FINDINGS: Brain: Stable cerebral volume. No midline shift, ventriculomegaly, mass effect, evidence of mass lesion, intracranial hemorrhage or evidence of cortically based acute infarction. Stable gray-white matter differentiation throughout the brain. Vascular: Calcified atherosclerosis at the skull base. No suspicious intracranial vascular hyperdensity. Skull: Stable hyperostosis, intact. Sinuses/Orbits: Visualized paranasal sinuses and mastoids are stable and well  aerated. Other: Broad-based right posterior scalp  hematoma measures up to 15 mm in thickness, roughly 5.5 cm diameter. No scalp soft tissue gas. Other scalp and orbits soft tissues appears stable. Underlying calvarium appears stable and intact. IMPRESSION: 1. Right posterior scalp hematoma without underlying skull fracture. 2. Stable non contrast CT appearance of the brain, no acute intracranial abnormality. Electronically Signed   By: Odessa Fleming M.D.   On: 08/15/2022 07:30    Assessment and Plan  Cassie Solis is a 88 y.o. female with medical history significant of syncope, vertigo, CKD-3B, anemia, sinus bradycardia, orthostatic hypotension, left hip fracture in 01/2022, who presents with fall and right hip pain.  Patient states that she tripped her steps and fell accidentally when she got up and went to the bathroom in the early morning.   X-ray of right femur/pelvis that showed right intertrochanteric femoral neck fracture.   Closed right hip fracture (HCC):  As evidenced by x-ray.  Orthopedic surgeon, Dr. Allena Katz consulted. Plans for surgery today  - Pain control: prn morphine, percocet and tylenol - When necessary Zofran for nausea - Robaxin for muscle spasm - Lidoderm patch for pain   Fall at home, initial encounter -fall precaution   Stage 3b chronic kidney disease (CKD) (HCC): Close to baseline -Follow-up with BMP   Iron deficiency anemia: Hemoglobin stable, 10.6  -Continue iron supplement   Depression -Continue home medications   Protein-calorie malnutrition, moderate (HCC) -Consult nutrition       Procedures: Family communication :son at beside Consults :Ortho CODE STATUS: DNR (discussed by Dr Clyde Lundborg) DVT Prophylaxis :per Ortho Level of care: Med-Surg Status is: Inpatient Remains inpatient appropriate because: Hip fracture--pending surgery    TOTAL TIME TAKING CARE OF THIS PATIENT: 35 minutes.  >50% time spent on counselling and coordination of care  Note: This  dictation was prepared with Dragon dictation along with smaller phrase technology. Any transcriptional errors that result from this process are unintentional.  Enedina Finner M.D    [facility]   CC: Primary care physician; Hamrick, Durward Fortes, MD

## 2022-08-16 NOTE — Anesthesia Preprocedure Evaluation (Signed)
Anesthesia Evaluation  Patient identified by MRN, date of birth, ID band Patient awake    Reviewed: Allergy & Precautions, NPO status , Patient's Chart, lab work & pertinent test results  Airway Mallampati: III  TM Distance: <3 FB Neck ROM: limited    Dental  (+) Chipped, Missing   Pulmonary neg pulmonary ROS   Pulmonary exam normal        Cardiovascular negative cardio ROS Normal cardiovascular exam     Neuro/Psych  PSYCHIATRIC DISORDERS  Depression    negative neurological ROS     GI/Hepatic negative GI ROS, Neg liver ROS,neg GERD  ,,  Endo/Other  negative endocrine ROS    Renal/GU Renal disease     Musculoskeletal  (+) Arthritis ,    Abdominal   Peds  Hematology  (+) Blood dyscrasia, anemia   Anesthesia Other Findings Past Medical History: No date: Adnexal mass No date: Anemia No date: Arthritis     Comment:  left wrist, bilateral hips No date: Chronic kidney disease     Comment:  Stage 3b No date: Lactic acidosis No date: Macular degeneration No date: Orthostatic hypotension No date: Osteoporosis No date: Seasonal allergies No date: Sinus bradycardia 02/21/2022: Syncope No date: Vertigo     Comment:  rare  Past Surgical History: No date: ABDOMINAL HYSTERECTOMY 06/25/2015: CATARACT EXTRACTION W/PHACO; Left     Comment:  Procedure: CATARACT EXTRACTION PHACO AND INTRAOCULAR               LENS PLACEMENT (IOC);  Surgeon: Lockie Mola, MD;               Location: [facility];  Service: Ophthalmology;                Laterality: Left;  PT PREFERS EARLY MORNING PER DR OFFICE 09/10/2015: CATARACT EXTRACTION W/PHACO; Right     Comment:  Procedure: CATARACT EXTRACTION PHACO AND INTRAOCULAR               LENS PLACEMENT (IOC);  Surgeon: Lockie Mola, MD;               Location: [facility];  Service: Ophthalmology;                Laterality: Right;  pt prefers early AM 02/26/2022:  FEMUR IM NAIL; Left     Comment:  Procedure: Left hip affixus removal with repeat fixation              with Affixus;  Surgeon: Kennedy Bucker, MD;  Location:               [facility];  Service: Orthopedics;  Laterality: Left; 01/07/2022: INTRAMEDULLARY (IM) NAIL INTERTROCHANTERIC; Left     Comment:  Procedure: INTRAMEDULLARY (IM) NAIL INTERTROCHANTRIC;                Surgeon: Kennedy Bucker, MD;  Location: [facility];                Service: Orthopedics;  Laterality: Left;  BMI    Body Mass Index: 18.01 kg/m      Reproductive/Obstetrics negative OB ROS                             Anesthesia Physical Anesthesia Plan  ASA: 2  Anesthesia Plan: Spinal   Post-op Pain Management:    Induction:   PONV Risk Score and Plan: Propofol infusion  Airway Management Planned: Natural Airway and  Nasal Cannula  Additional Equipment:   Intra-op Plan:   Post-operative Plan:   Informed Consent: I have reviewed the patients History and Physical, chart, labs and discussed the procedure including the risks, benefits and alternatives for the proposed anesthesia with the patient or authorized representative who has indicated his/her understanding and acceptance.   Patient has DNR.  Discussed DNR with patient and Suspend DNR.   Dental Advisory Given  Plan Discussed with: Anesthesiologist, CRNA and Surgeon  Anesthesia Plan Comments: (Temporary suspend DNR only during OR/Peri-operative period  Patient reports no bleeding problems and no anticoagulant use.  Plan for spinal with backup GA  Patient consented for risks of anesthesia including but not limited to:  - adverse reactions to medications - damage to eyes, teeth, lips or other oral mucosa - nerve damage due to positioning  - risk of bleeding, infection and or nerve damage from spinal that could lead to paralysis - risk of headache or failed spinal - damage to teeth, lips or other oral mucosa - sore throat or  hoarseness - damage to heart, brain, nerves, lungs, other parts of body or loss of life  Patient voiced understanding.)        Anesthesia Quick Evaluation

## 2022-08-17 ENCOUNTER — Encounter: Payer: Self-pay | Admitting: Orthopedic Surgery

## 2022-08-17 DIAGNOSIS — S72001A Fracture of unspecified part of neck of right femur, initial encounter for closed fracture: Secondary | ICD-10-CM | POA: Diagnosis not present

## 2022-08-17 DIAGNOSIS — W19XXXA Unspecified fall, initial encounter: Secondary | ICD-10-CM | POA: Diagnosis not present

## 2022-08-17 LAB — CBC
HCT: 24 % — ABNORMAL LOW (ref 36.0–46.0)
Hemoglobin: 7.8 g/dL — ABNORMAL LOW (ref 12.0–15.0)
MCH: 34.4 pg — ABNORMAL HIGH (ref 26.0–34.0)
MCHC: 32.5 g/dL (ref 30.0–36.0)
MCV: 105.7 fL — ABNORMAL HIGH (ref 80.0–100.0)
Platelets: 116 10*3/uL — ABNORMAL LOW (ref 150–400)
RBC: 2.27 MIL/uL — ABNORMAL LOW (ref 3.87–5.11)
RDW: 14.3 % (ref 11.5–15.5)
WBC: 9.1 10*3/uL (ref 4.0–10.5)
nRBC: 0 % (ref 0.0–0.2)

## 2022-08-17 LAB — BASIC METABOLIC PANEL
Anion gap: 5 (ref 5–15)
BUN: 29 mg/dL — ABNORMAL HIGH (ref 8–23)
CO2: 25 mmol/L (ref 22–32)
Calcium: 8 mg/dL — ABNORMAL LOW (ref 8.9–10.3)
Chloride: 108 mmol/L (ref 98–111)
Creatinine, Ser: 1.12 mg/dL — ABNORMAL HIGH (ref 0.44–1.00)
GFR, Estimated: 46 mL/min — ABNORMAL LOW (ref >60)
Glucose, Bld: 108 mg/dL — ABNORMAL HIGH (ref 70–99)
Potassium: 3.8 mmol/L (ref 3.5–5.1)
Sodium: 138 mmol/L (ref 135–145)

## 2022-08-17 MED ORDER — OXYCODONE HCL 5 MG PO TABS: 2.5000 mg | ORAL_TABLET | ORAL | 0 refills | Status: AC | PRN

## 2022-08-17 MED ORDER — ENOXAPARIN SODIUM 40 MG/0.4ML IJ SOSY: 40.0000 mg | PREFILLED_SYRINGE | INTRAMUSCULAR | 0 refills | Status: AC

## 2022-08-17 MED ORDER — TRAMADOL HCL 50 MG PO TABS: 50.0000 mg | ORAL_TABLET | Freq: Four times a day (QID) | ORAL | 0 refills | Status: AC | PRN

## 2022-08-17 NOTE — Plan of Care (Signed)
  Problem: Education: Goal: Knowledge of General Education information will improve Description: Including pain rating scale, medication(s)/side effects and non-pharmacologic comfort measures Outcome: Progressing   Problem: Health Behavior/Discharge Planning: Goal: Ability to manage health-related needs will improve Outcome: Progressing   Problem: Clinical Measurements: Goal: Ability to maintain clinical measurements within normal limits will improve Outcome: Progressing Goal: Will remain free from infection Outcome: Progressing Goal: Diagnostic test results will improve Outcome: Progressing Goal: Respiratory complications will improve Outcome: Progressing Goal: Cardiovascular complication will be avoided Outcome: Progressing   Problem: Activity: Goal: Risk for activity intolerance will decrease Outcome: Progressing   Problem: Nutrition: Goal: Adequate nutrition will be maintained Outcome: Progressing   Problem: Coping: Goal: Level of anxiety will decrease Outcome: Progressing   Problem: Elimination: Goal: Will not experience complications related to bowel motility Outcome: Progressing Goal: Will not experience complications related to urinary retention Outcome: Progressing   Problem: Pain Managment: Goal: General experience of comfort will improve Outcome: Progressing   Problem: Safety: Goal: Ability to remain free from injury will improve Outcome: Progressing   Problem: Skin Integrity: Goal: Risk for impaired skin integrity will decrease Outcome: Progressing   Problem: Pain Management: Goal: Pain level will decrease Outcome: Progressing   Problem: Education: Goal: Verbalization of understanding the information provided (i.e., activity precautions, restrictions, etc) will improve Outcome: Progressing Goal: Individualized Educational Video(s) Outcome: Progressing   Problem: Activity: Goal: Ability to ambulate and perform ADLs will improve Outcome:  Progressing   Problem: Clinical Measurements: Goal: Postoperative complications will be avoided or minimized Outcome: Progressing   Problem: Self-Concept: Goal: Ability to maintain and perform role responsibilities to the fullest extent possible will improve Outcome: Progressing   Problem: Pain Management: Goal: Pain level will decrease Outcome: Progressing

## 2022-08-17 NOTE — Evaluation (Signed)
Occupational Therapy Evaluation Patient Details Name: Cassie Solis MRN: [id number] DOB: 03/04/1929 Today's Date: 08/17/2022   History of Present Illness Cassie Solis is a 88 y.o. female with medical history significant of syncope, vertigo, CKD-3B, anemia, sinus bradycardia, orthostatic hypotension, left hip fracture in 01/2022, who presents with fall and right hip pain. S/p R hip IM nailing 08/16/22.   Clinical Impression   Pt was seen for OT evaluation this date. Prior to hospital admission, pt lives alone with regular assistance from family members and caregiving staff. Pt presents to acute OT demonstrating impaired ADL performance and functional mobility. She requires Mod A for bed mobility and transfers w/ RW, demonstrating fair/poor posture. She is alert and oriented although demonstrates confusion when asked to describe her living situation and PLOF. She has some word-finding difficulties. Pt endorses mild pain. Pt would benefit from skilled OT services to address noted impairments and functional limitations (see below for any additional details) in order to maximize safety and independence while minimizing falls risk and caregiver burden. Upon hospital discharge, recommend DC to SNF to maximize possibility of return to PLOF.       Recommendations for follow up therapy are one component of a multi-disciplinary discharge planning process, led by the attending physician.  Recommendations may be updated based on patient status, additional functional criteria and insurance authorization.   Follow Up Recommendations  Skilled nursing-short term rehab (<3 hours/day)     Assistance Recommended at Discharge Frequent or constant Supervision/Assistance  Patient can return home with the following A lot of help with walking and/or transfers;A lot of help with bathing/dressing/bathroom;Assistance with cooking/housework;Direct supervision/assist for medications management;Assist for transportation     Functional Status Assessment  Patient has had a recent decline in their functional status and demonstrates the ability to make significant improvements in function in a reasonable and predictable amount of time.  Equipment Recommendations  None recommended by OT    Recommendations for Other Services       Precautions / Restrictions Precautions Precautions: Fall Restrictions Weight Bearing Restrictions: Yes RLE Weight Bearing: Weight bearing as tolerated      Mobility Bed Mobility Overal bed mobility: Needs Assistance Bed Mobility: Supine to Sit, Sit to Supine     Supine to sit: HOB elevated, Mod assist Sit to supine: Mod assist   General bed mobility comments: Requires Mod A for maneuvering b/l LE    Transfers Overall transfer level: Needs assistance Equipment used: Rolling walker (2 wheels) Transfers: Sit to/from Stand Sit to Stand: Min assist           General transfer comment: standing difficult without L lift shoe -- family will bring to hospital later today      Balance Overall balance assessment: Needs assistance Sitting-balance support: Bilateral upper extremity supported, Feet unsupported Sitting balance-Leahy Scale: Fair     Standing balance support: Reliant on assistive device for balance, Bilateral upper extremity supported Standing balance-Leahy Scale: Poor                             ADL either performed or assessed with clinical judgement   ADL                                               Vision         Perception  Praxis      Pertinent Vitals/Pain Pain Assessment Faces Pain Scale: Hurts a little bit Pain Location: R hip Pain Descriptors / Indicators: Aching, Discomfort Pain Intervention(s): Limited activity within patient's tolerance, Repositioned, Premedicated before session     Hand Dominance     Extremity/Trunk Assessment Upper Extremity Assessment Upper Extremity Assessment: Overall  WFL for tasks assessed   Lower Extremity Assessment Lower Extremity Assessment: Generalized weakness;RLE deficits/detail RLE Deficits / Details: R hip fx LLE Deficits / Details: length discrepency -- L LE ~ 3" shorter than R LE   Cervical / Trunk Assessment Cervical / Trunk Assessment: Normal   Communication Communication Communication: No difficulties   Cognition Arousal/Alertness: Awake/alert Behavior During Therapy: WFL for tasks assessed/performed Overall Cognitive Status: History of cognitive impairments - at baseline                                       General Comments       Exercises Other Exercises Other Exercises: Educ re: PoC, DC recs, WB precautions, falls prevention   Shoulder Instructions      Home Living Family/patient expects to be discharged to:: Private residence Living Arrangements: Alone Available Help at Discharge: Family;Personal care attendant;Available PRN/intermittently Type of Home: House Home Access: Ramped entrance     Home Layout: One level     Bathroom Shower/Tub: Chief Strategy Officer: Standard     Home Equipment: Agricultural consultant (2 wheels);Tub bench;Toilet riser          Prior Functioning/Environment Prior Level of Function : Needs assist       Physical Assist : Mobility (physical);ADLs (physical)     Mobility Comments: Ambulates with RW ADLs Comments: Completes bed mobility, dressing, toileting INDly. Attendants or family assist with bathing, IADL        OT Problem List: Impaired balance (sitting and/or standing);Decreased activity tolerance;Decreased range of motion;Decreased strength      OT Treatment/Interventions: Self-care/ADL training;Therapeutic exercise;Patient/family education;Balance training;Energy conservation;Therapeutic activities;DME and/or AE instruction    OT Goals(Current goals can be found in the care plan section) Acute Rehab OT Goals Patient Stated Goal: to get home  ASAP OT Goal Formulation: With patient Time For Goal Achievement: 08/31/22 Potential to Achieve Goals: Good ADL Goals Pt Will Perform Grooming: with min guard assist;standing Pt Will Transfer to Toilet: with mod assist;ambulating;stand pivot transfer Pt Will Perform Tub/Shower Transfer: with min assist;ambulating;tub bench;rolling walker  OT Frequency: Min 2X/week    Co-evaluation              AM-PAC OT "6 Clicks" Daily Activity     Outcome Measure Help from another person eating meals?: None Help from another person taking care of personal grooming?: A Little Help from another person toileting, which includes using toliet, bedpan, or urinal?: A Lot Help from another person bathing (including washing, rinsing, drying)?: A Lot Help from another person to put on and taking off regular upper body clothing?: None Help from another person to put on and taking off regular lower body clothing?: A Lot 6 Click Score: 17   End of Session Equipment Utilized During Treatment: Rolling walker (2 wheels)  Activity Tolerance: Patient tolerated treatment well Patient left: in bed;with family/visitor present;with nursing/sitter in room;with call bell/phone within reach  OT Visit Diagnosis: Unsteadiness on feet (R26.81);Other abnormalities of gait and mobility (R26.89);Muscle weakness (generalized) (M62.81)  Time: 6773-7366 OT Time Calculation (min): 27 min Charges:  OT General Charges $OT Visit: 1 Visit OT Evaluation $OT Eval Low Complexity: 1 Low OT Treatments $Self Care/Home Management : 23-37 mins Latina Craver, PhD, MS, OTR/L 08/17/22, 12:37 PM

## 2022-08-17 NOTE — Plan of Care (Signed)
  Problem: Education: Goal: Knowledge of General Education information will improve Description: Including pain rating scale, medication(s)/side effects and non-pharmacologic comfort measures Outcome: Progressing   Problem: Activity: Goal: Risk for activity intolerance will decrease Outcome: Progressing   Problem: Pain Managment: Goal: General experience of comfort will improve Outcome: Progressing   Problem: Skin Integrity: Goal: Risk for impaired skin integrity will decrease Outcome: Progressing   Problem: Activity: Goal: Ability to ambulate and perform ADLs will improve Outcome: Progressing   Problem: Pain Management: Goal: Pain level will decrease Outcome: Progressing

## 2022-08-17 NOTE — Evaluation (Signed)
Physical Therapy Evaluation Patient Details Name: Cassie Solis MRN: [id number] DOB: 22-Sep-1928 Today's Date: 08/17/2022  History of Present Illness  Cassie Solis is a 88 y.o. female with medical history significant of syncope, vertigo, CKD-3B, anemia, sinus bradycardia, orthostatic hypotension, left hip fracture in 01/2022, who presents with fall and right hip pain. S/p R hip IM nailing 08/16/22.   Clinical Impression  Pt admitted with above diagnosis. Pt received upright in bed with visitor present. Agreeable to PT eval. Reports pain as mild. At baseline was living at her home with near 24/7 supervision between family and PCA's. Reports baseline ambulation with RW and L shoe lift due to LLE leg length discrepancy. Gets assist with ADL's/IADL's.   To date, pt able to transfer to [facility] with minA at torso with good initiation and sequencing of UE's/LE's with bed features. L shoe lift not present in room but visitor updated family confirming they plan on bringing it today. Pt able to stand modA from EOB with VC's for hand placement and significant WB on LLE due to leg length discrepancy. Limited glut and knee extension leading to limited upright posture with heavy WB through RW with UE's. Pt returned to seated then supine with modA at LE's deferring further mobility until shoe present. Pt educated on therex with HEP packet provided. All needs in reach. Pt currently with functional limitations due to the deficits listed below (see PT Problem List). Pt will benefit from skilled PT to increase their independence and safety with mobility to allow discharge to the venue listed below.      Vitals upright in bed prior to mobility: 120/63 mm Hg  Seated EOB: 113/63 mm Hg   Recommendations for follow up therapy are one component of a multi-disciplinary discharge planning process, led by the attending physician.  Recommendations may be updated based on patient status, additional functional criteria and insurance  authorization.  Follow Up Recommendations Skilled nursing-short term rehab (<3 hours/day) Can patient physically be transported by private vehicle: No    Assistance Recommended at Discharge Frequent or constant Supervision/Assistance  Patient can return home with the following  A lot of help with bathing/dressing/bathroom;Assist for transportation;Assistance with cooking/housework;A lot of help with walking and/or transfers;Help with stairs or ramp for entrance    Equipment Recommendations Other (comment) (TBD by next venue of care)  Recommendations for Other Services       Functional Status Assessment Patient has had a recent decline in their functional status and demonstrates the ability to make significant improvements in function in a reasonable and predictable amount of time.     Precautions / Restrictions Precautions Precautions: Fall Restrictions Weight Bearing Restrictions: Yes RLE Weight Bearing: Weight bearing as tolerated      Mobility  Bed Mobility Overal bed mobility: Needs Assistance Bed Mobility: Supine to Sit, Sit to Supine     Supine to sit: Min assist, HOB elevated Sit to supine: Mod assist   General bed mobility comments: able to initiate and assist UE'/LE's to EOB with VC's for positioning. minA needed at torso. modA returning to supine at LE's Patient Response: Cooperative  Transfers Overall transfer level: Needs assistance Equipment used: Rolling walker (2 wheels) Transfers: Sit to/from Stand Sit to Stand: Min assist           General transfer comment: LImited capacity due to absence of L lift shoe.    Ambulation/Gait               General Gait Details: deferred  due not lift shoe not present in room.  Stairs            Wheelchair Mobility    Modified Rankin (Stroke Patients Only)       Balance Overall balance assessment: Needs assistance Sitting-balance support: Bilateral upper extremity supported, Feet  unsupported Sitting balance-Leahy Scale: Fair       Standing balance-Leahy Scale: Poor Standing balance comment: reliant on RW and LLE                             Pertinent Vitals/Pain Pain Assessment Pain Assessment: Faces Faces Pain Scale: Hurts a little bit Pain Location: R hip Pain Descriptors / Indicators: Aching, Discomfort Pain Intervention(s): Limited activity within patient's tolerance, Monitored during session, Premedicated before session, Repositioned    Home Living Family/patient expects to be discharged to:: Private residence Living Arrangements: Alone Available Help at Discharge: Family;Personal care attendant;Available PRN/intermittently Type of Home: House Home Access: Ramped entrance       Home Layout: One level Home Equipment: Agricultural consultant (2 wheels);Tub bench;Toilet riser      Prior Function Prior Level of Function : Needs assist       Physical Assist : Mobility (physical);ADLs (physical)     Mobility Comments: Ambulates with RW ADLs Comments: Gets some assist with ADL's/IADL's     Hand Dominance        Extremity/Trunk Assessment   Upper Extremity Assessment Upper Extremity Assessment: Overall WFL for tasks assessed    Lower Extremity Assessment Lower Extremity Assessment: Generalized weakness;RLE deficits/detail;LLE deficits/detail RLE Deficits / Details: R hip fx LLE Deficits / Details: length discrepency    Cervical / Trunk Assessment Cervical / Trunk Assessment: Normal  Communication   Communication: No difficulties  Cognition Arousal/Alertness: Awake/alert Behavior During Therapy: WFL for tasks assessed/performed Overall Cognitive Status: History of cognitive impairments - at baseline                                          General Comments      Exercises General Exercises - Lower Extremity Ankle Circles/Pumps: AROM, Strengthening, Both, Supine Quad Sets: AROM, Strengthening, Right, 10  reps, Supine Gluteal Sets: Strengthening, Both, 10 reps, Supine Long Arc Quad: AROM, Strengthening, Both, 10 reps, Seated Heel Slides: AAROM, Strengthening, Right, 10 reps, Supine Hip ABduction/ADduction: AAROM, Strengthening, Right, 10 reps, Supine Other Exercises Other Exercises: Role of PT in acute setting, WB precautions, D/c recs, LE therex   Assessment/Plan    PT Assessment Patient needs continued PT services  PT Problem List Decreased strength;Decreased range of motion;Decreased activity tolerance;Decreased balance       PT Treatment Interventions DME instruction;Balance training;Gait training;Neuromuscular re-education;Functional mobility training;Patient/family education;Therapeutic activities;Therapeutic exercise    PT Goals (Current goals can be found in the Care Plan section)  Acute Rehab PT Goals Patient Stated Goal: improve mobility PT Goal Formulation: With patient Time For Goal Achievement: 08/31/22 Potential to Achieve Goals: Good    Frequency 7X/week     Co-evaluation               AM-PAC PT "6 Clicks" Mobility  Outcome Measure Help needed turning from your back to your side while in a flat bed without using bedrails?: A Little Help needed moving from lying on your back to sitting on the side of a flat bed without using bedrails?: A Little  Help needed moving to and from a bed to a chair (including a wheelchair)?: A Lot Help needed standing up from a chair using your arms (e.g., wheelchair or bedside chair)?: A Lot Help needed to walk in hospital room?: Total Help needed climbing 3-5 steps with a railing? : Total 6 Click Score: 12    End of Session Equipment Utilized During Treatment: Gait belt Activity Tolerance: Patient tolerated treatment well Patient left: in bed;with call bell/phone within reach;with bed alarm set;with family/visitor present Nurse Communication: Mobility status PT Visit Diagnosis: Unsteadiness on feet (R26.81);Other  abnormalities of gait and mobility (R26.89);Muscle weakness (generalized) (M62.81)    Time: 0258-5277 PT Time Calculation (min) (ACUTE ONLY): 18 min   Charges:   PT Evaluation $PT Eval Moderate Complexity: 1 Mod         Jalayiah Bibian M. Fairly IV, PT, DPT Physical Therapist-   [facility]  08/17/2022, 11:09 AM

## 2022-08-17 NOTE — Progress Notes (Signed)
[facility] NAME: Malayna Test    MR#:  [id number]  DATE OF BIRTH:  Nov 03, 1928  SUBJECTIVE:  Family friend at bedside.  Patient came in after she had mechanical fall while you're trying to use the bathroom early hours of the morning yesterday.   POD#1 overall stable. Hgb down a bit. Worked with PT   VITALS:  Blood pressure 109/64, pulse 87, temperature 97.9 F (36.6 C), resp. rate 16, height 5\' 4"  (1.626 m), weight 47.6 kg, SpO2 100 %.  PHYSICAL EXAMINATION:   GENERAL:  88 y.o.-year-old patient lying in the bed with no acute distress.  LUNGS: Normal breath sounds bilaterally, no wheezing CARDIOVASCULAR: S1, S2 normal. No murmur   ABDOMEN: Soft, nontender, nondistended. Bowel sounds present.  EXTREMITIES:  surgical dressing + NEUROLOGIC: nonfocal  patient is alert and awake SKIN: No obvious rash, lesion, or ulcer.   LABORATORY PANEL:  CBC Recent Labs  Lab 08/17/22 0320  WBC 9.1  HGB 7.8*  HCT 24.0*  PLT 116*     Chemistries  Recent Labs  Lab 08/17/22 0320  NA 138  K 3.8  CL 108  CO2 25  GLUCOSE 108*  BUN 29*  CREATININE 1.12*  CALCIUM 8.0*     RADIOLOGY:  DG HIP UNILAT WITH PELVIS 2-3 VIEWS RIGHT  Result Date: 08/16/2022 CLINICAL DATA:  Surgery, like deve EXAM: DG HIP (WITH OR WITHOUT PELVIS) 2-3V RIGHT COMPARISON:  None Available. FINDINGS: Intraoperative images during right hip ORIF. Improved intertrochanteric fracture alignment. No evidence of immediate complication. IMPRESSION: Intraoperative images during right hip ORIF. Improved fracture alignment without evidence of immediate complication. Electronically Signed   By: Maurine Simmering M.D.   On: 08/16/2022 16:44   DG C-Arm 1-60 Min-No Report  Result Date: 08/16/2022 Fluoroscopy was utilized by the requesting physician.  No radiographic interpretation.    Assessment and Plan  Consetta Akita Isler is a 88 y.o. female with medical history significant of  syncope, vertigo, CKD-3B, anemia, sinus bradycardia, orthostatic hypotension, left hip fracture in 01/2022, who presents with fall and right hip pain.  Patient states that she tripped her steps and fell accidentally when she got up and went to the bathroom in the early morning.   X-ray of right femur/pelvis that showed right intertrochanteric femoral neck fracture.   Closed right hip fracture ([place]):   As evidenced by x-ray.  Orthopedic surgeon, Dr. Posey Pronto consulted.   - Pain control: prn morphine, percocet and tylenol - When necessary Zofran for nausea - Robaxin for muscle spasm - Lidoderm patch for pain --POD #1 hgb 7.8. pt is anemic at baseline (6.6--8.2)    Fall at home, initial encounter -fall precaution   Stage 3b chronic kidney disease (CKD) ([place]): Close to baseline -Follow-up with BMP   Iron deficiency anemia: Hemoglobin stable, 10.6--7.8  -Continue iron supplement --pt anemic at baseline --post op anemia as expected --transfuse as needed   Depression -Continue home medications   Protein-calorie malnutrition, moderate (HCC) -Consult nutrition       Procedures:ntramedullary nailing of Right femur with cephalomedullary device  Family communication family friend Consults :Ortho CODE STATUS: DNR (discussed by Dr Blaine Hamper) DVT Prophylaxis :per Ortho Level of care: Med-Surg Status is: Inpatient Remains inpatient appropriate because:  TOC for d/c planning to rehab    TOTAL TIME TAKING CARE OF THIS PATIENT: 35 minutes.  >50% time spent on counselling and coordination of care  Note: This dictation was prepared with Dragon dictation  along with smaller phrase technology. Any transcriptional errors that result from this process are unintentional.  Enedina Finner M.D    [facility]   CC: Primary care physician; Hamrick, Durward Fortes, MD

## 2022-08-17 NOTE — Discharge Instructions (Signed)
INSTRUCTIONS AFTER Surgery  Remove items at home which could result in a fall. This includes throw rugs or furniture in walking pathways ICE to the affected joint every three hours while awake for 30 minutes at a time, for at least the first 3-5 days, and then as needed for pain and swelling.  Continue to use ice for pain and swelling. You may notice swelling that will progress down to the foot and ankle.  This is normal after surgery.  Elevate your leg when you are not up walking on it.   Continue to use the breathing machine you got in the hospital (incentive spirometer) which will help keep your temperature down.  It is common for your temperature to cycle up and down following surgery, especially at night when you are not up moving around and exerting yourself.  The breathing machine keeps your lungs expanded and your temperature down.   DIET:  As you were doing prior to hospitalization, we recommend a well-balanced diet.  DRESSING / WOUND CARE / SHOWERING  Dressing change as needed.  No showering.  Staples will be removed in 2 weeks at [facility].  ACTIVITY  Increase activity slowly as tolerated, but follow the weight bearing instructions below.   No driving for 6 weeks or until further direction given by your physician.  You cannot drive while taking narcotics.  No lifting or carrying greater than 10 lbs. until further directed by your surgeon. Avoid periods of inactivity such as sitting longer than an hour when not asleep. This helps prevent blood clots.  You may return to work once you are authorized by your doctor.     WEIGHT BEARING  Weightbearing as tolerated on the right   EXERCISES Gait training and ambulation training with physical therapy.  CONSTIPATION  Constipation is defined medically as fewer than three stools per week and severe constipation as less than one stool per week.  Even if you have a regular bowel pattern at home, your normal regimen is  likely to be disrupted due to multiple reasons following surgery.  Combination of anesthesia, postoperative narcotics, change in appetite and fluid intake all can affect your bowels.   YOU MUST use at least one of the following options; they are listed in order of increasing strength to get the job done.  They are all available over the counter, and you may need to use some, POSSIBLY even all of these options:    Drink plenty of fluids (prune juice may be helpful) and high fiber foods Colace 100 mg by mouth twice a day  Senokot for constipation as directed and as needed Dulcolax (bisacodyl), take with full glass of water  Miralax (polyethylene glycol) once or twice a day as needed.  If you have tried all these things and are unable to have a bowel movement in the first 3-4 days after surgery call either your surgeon or your primary doctor.    If you experience loose stools or diarrhea, hold the medications until you stool forms back up.  If your symptoms do not get better within 1 week or if they get worse, check with your doctor.  If you experience "the worst abdominal pain ever" or develop nausea or vomiting, please contact the office immediately for further recommendations for treatment.   ITCHING:  If you experience itching with your medications, try taking only a single pain pill, or even half a pain pill at a time.  You can also use Benadryl over the   counter for itching or also to help with sleep.   TED HOSE STOCKINGS:  Use stockings on both legs until for at least 2 weeks or as directed by physician office. They may be removed at night for sleeping.  MEDICATIONS:  See your medication summary on the "After Visit Summary" that nursing will review with you.  You may have some home medications which will be placed on hold until you complete the course of blood thinner medication.  It is important for you to complete the blood thinner medication as prescribed.  PRECAUTIONS:  If you experience  chest pain or shortness of breath - call 911 immediately for transfer to the hospital emergency department.   If you develop a fever greater that 101 F, purulent drainage from wound, increased redness or drainage from wound, foul odor from the wound/dressing, or calf pain - CONTACT YOUR SURGEON.                                                   FOLLOW-UP APPOINTMENTS:  If you do not already have a post-op appointment, please call the office for an appointment to be seen by your surgeon.  Guidelines for how soon to be seen are listed in your "After Visit Summary", but are typically between 1-4 weeks after surgery.  OTHER INSTRUCTIONS:     MAKE SURE YOU:  Understand these instructions.  Get help right away if you are not doing well or get worse.    Thank you for letting us be a part of your medical care team.  It is a privilege we respect greatly.  We hope these instructions will help you stay on track for a fast and full recovery!

## 2022-08-17 NOTE — Progress Notes (Signed)
  Subjective: 1 Day Post-Op Procedure(s) (LRB): INTRAMEDULLARY (IM) NAIL INTERTROCHANTERIC (Right) Patient reports pain as mild.   Patient is well, and has had no acute complaints or problems Plan is to go Rehab after hospital stay. Negative for chest pain and shortness of breath Fever: no Gastrointestinal: Negative for nausea and vomiting  Objective: Vital signs in last 24 hours: Temp:  [97.5 F (36.4 C)-98.9 F (37.2 C)] 97.9 F (36.6 C) (12/19 0444) Pulse Rate:  [72-92] 87 (12/19 0444) Resp:  [13-18] 16 (12/19 0444) BP: (92-150)/(53-78) 109/64 (12/19 0444) SpO2:  [94 %-100 %] 100 % (12/19 0444) Weight:  [47.6 kg] 47.6 kg (12/18 1310)  Intake/Output from previous day:  Intake/Output Summary (Last 24 hours) at 08/17/2022 0727 Last data filed at 08/17/2022 0533 Gross per 24 hour  Intake 900 ml  Output 950 ml  Net -50 ml    Intake/Output this shift: No intake/output data recorded.  Labs: Recent Labs    08/15/22 0836 08/16/22 0521 08/17/22 0320  HGB 10.6* 8.7* 7.8*   Recent Labs    08/16/22 0521 08/17/22 0320  WBC 6.7 9.1  RBC 2.57* 2.27*  HCT 26.9* 24.0*  PLT 128* 116*   Recent Labs    08/16/22 0521 08/17/22 0320  NA 138 138  K 3.8 3.8  CL 107 108  CO2 27 25  BUN 29* 29*  CREATININE 0.89 1.12*  GLUCOSE 114* 108*  CALCIUM 8.6* 8.0*   Recent Labs    08/15/22 1159  INR 1.3*     EXAM General - Patient is Alert and Oriented Extremity - Sensation intact distally Dorsiflexion/Plantar flexion intact Compartment soft Dressing/Incision - clean, dry, blood tinged drainage Motor Function - intact, moving foot and toes well on exam.   Past Medical History:  Diagnosis Date   Adnexal mass    Anemia    Arthritis    left wrist, bilateral hips   Chronic kidney disease    Stage 3b   Lactic acidosis    Macular degeneration    Orthostatic hypotension    Osteoporosis    Seasonal allergies    Sinus bradycardia    Syncope 02/21/2022   Vertigo     rare    Assessment/Plan: 1 Day Post-Op Procedure(s) (LRB): INTRAMEDULLARY (IM) NAIL INTERTROCHANTERIC (Right) Principal Problem:   Closed right hip fracture (HCC) Active Problems:   Stage 3b chronic kidney disease (CKD) (HCC)   Fall   Protein-calorie malnutrition, moderate (HCC)   Iron deficiency anemia   Depression  Estimated body mass index is 18.01 kg/m as calculated from the following:   Height as of this encounter: 5\' 4"  (1.626 m).   Weight as of this encounter: 47.6 kg. Advance diet Up with therapy  Acute blood loss anemia.  Status post hip fracture.  Hemoglobin 7.8.  Discharge planning.  Follow-up at [facility] in 2 weeks for x-rays of the right hip and staple removal.  DVT Prophylaxis - Lovenox, Foot Pumps, and TED hose Weight-Bearing as tolerated to right leg  [facility], PA-C Orthopaedic Surgery 08/17/2022, 7:27 AM

## 2022-08-17 NOTE — TOC Progression Note (Signed)
Transition of Care [facility]) - Progression Note    Patient Details  Name: Cassie Solis MRN: [id number] Date of Birth: [birth year]  Transition of Care [facility]) CM/SW [place], RN Phone Number: 08/17/2022, 2:16 PM  Clinical Narrative:      Met with the patient in the room, she is agreeable to a bedsearch to find a bed available, however wants me to check with son Konrad Dolores about plan before agreering to a facility, Konrad Dolores is currently at a funeral and can not be disturbed, bedsearch will be done and will contact tommy at a later time      Expected Discharge Plan and Services                                                 Social Determinants of Health (SDOH) Interventions Housing Interventions: Intervention Not Indicated  Readmission Risk Interventions    01/08/2022    1:46 PM  Readmission Risk Prevention Plan  Transportation Screening Complete  PCP or Specialist Appt within 5-7 Days Complete  Home Care Screening Complete  Medication Review (RN CM) Complete

## 2022-08-17 NOTE — NC FL2 (Signed)
[place] MEDICAID FL2 LEVEL OF CARE FORM     IDENTIFICATION  Patient Name: Cassie Solis Birthdate: 09-20-1928 Sex: female Admission Date (Current Location): 08/15/2022  [facility] and [place] Number:  Chiropodist and Address:  [facility], [address] [place], [place] [address]      Provider Number: [id number]  Attending Physician Name and Address:  Enedina Finner, MD  Relative Name and Phone Number:       Current Level of Care: Hospital Recommended Level of Care: Skilled Nursing Facility Prior Approval Number:    Date Approved/Denied:   PASRR Number: [id number] A - Pulls up on [address] with last 4 of SS# [id number]  Discharge Plan: SNF    Current Diagnoses: Patient Active Problem List   Diagnosis Date Noted   Closed right hip fracture (HCC) 08/15/2022   Fall 08/15/2022   Protein-calorie malnutrition, moderate (HCC) 08/15/2022   Iron deficiency anemia 08/15/2022   Depression 08/15/2022   S/P ORIF (open reduction internal fixation) fracture 02/26/2022   Adnexal mass 01/11/2022   Acute blood loss anemia    Femur fracture, left (HCC) 01/07/2022   Stage 3b chronic kidney disease (CKD) (HCC) 01/07/2022   Lactic acidosis 01/07/2022   Closed hip fracture requiring operative repair, left, sequela 01/06/2022   Orthostatic hypotension 01/06/2022   Sinus bradycardia 01/06/2022    Orientation RESPIRATION BLADDER Height & Weight     Self, Time, Situation, Place  Normal Continent Weight: 104 lb 15 oz (47.6 kg) Height:  5\' 4"  (162.6 cm)  BEHAVIORAL SYMPTOMS/MOOD NEUROLOGICAL BOWEL NUTRITION STATUS   (None)  (None) Continent Diet (Regular)  AMBULATORY STATUS COMMUNICATION OF NEEDS Skin   Extensive Assist Verbally Skin abrasions, Surgical wounds (Incision on right hip: [place])                       Personal Care Assistance Level of Assistance  Bathing, Feeding, Dressing Bathing Assistance: Limited assistance Feeding assistance: Limited  assistance Dressing Assistance: Limited assistance     Functional Limitations Info  Sight, Hearing, Speech Sight Info: Adequate Hearing Info: Adequate Speech Info: Adequate    SPECIAL CARE FACTORS FREQUENCY  PT (By licensed PT), OT (By licensed OT)     PT Frequency: 5 x week OT Frequency: 5 x week            Contractures Contractures Info: Not present    Additional Factors Info  Code Status, Allergies, Psychotropic Code Status Info: DNR Allergies Info: Azithromycin Psychotropic Info: Depression         Current Medications (08/17/2022):  This is the current hospital active medication list Current Facility-Administered Medications  Medication Dose Route Frequency Provider Last Rate Last Admin   acetaminophen (TYLENOL) tablet 1,000 mg  1,000 mg Oral Q8H 08/19/2022, MD   1,000 mg at 08/17/22 1354   bisacodyl (DULCOLAX) suppository 10 mg  10 mg Rectal Daily PRN 08/19/22, MD       docusate sodium (COLACE) capsule 100 mg  100 mg Oral BID Signa Kell, MD   100 mg at 08/17/22 0926   enoxaparin (LOVENOX) injection 40 mg  40 mg Subcutaneous Q24H 08/19/22, MD   40 mg at 08/17/22 08/19/22   Fe Fum-Vit C-Vit B12-FA (TRIGELS-F FORTE) capsule 1 capsule  1 capsule Oral QPC breakfast [id number], MD   1 capsule at 08/17/22 08/19/22   ferrous sulfate tablet 325 mg  325 mg Oral Q breakfast [id number], MD   325 mg at  08/17/22 0843   HYDROmorphone (DILAUDID) injection 0.2-0.4 mg  0.2-0.4 mg Intravenous Q4H PRN Signa Kell, MD       methocarbamol (ROBAXIN) tablet 500 mg  500 mg Oral Q6H PRN Signa Kell, MD   500 mg at 08/17/22 0544   Or   methocarbamol (ROBAXIN) 500 mg in dextrose 5 % 50 mL IVPB  500 mg Intravenous Q6H PRN Signa Kell, MD       methocarbamol (ROBAXIN) tablet 500 mg  500 mg Oral Q8H PRN Signa Kell, MD   500 mg at 08/15/22 2209   metoCLOPramide (REGLAN) tablet 5-10 mg  5-10 mg Oral Q8H PRN Signa Kell, MD       Or   metoCLOPramide (REGLAN) injection 5-10 mg  5-10  mg Intravenous Q8H PRN Signa Kell, MD       mirtazapine (REMERON) tablet 15 mg  15 mg Oral QHS Signa Kell, MD   15 mg at 08/16/22 2100   ondansetron (ZOFRAN) tablet 4 mg  4 mg Oral Q6H PRN Signa Kell, MD       Or   ondansetron [facility]) injection 4 mg  4 mg Intravenous Q6H PRN Signa Kell, MD       oxyCODONE (Oxy IR/ROXICODONE) immediate release tablet 5-10 mg  5-10 mg Oral Q4H PRN Signa Kell, MD   10 mg at 08/17/22 9678   senna-docusate (Senokot-S) tablet 1 tablet  1 tablet Oral QHS PRN Signa Kell, MD       sodium phosphate (FLEET) 7-19 GM/118ML enema 1 enema  1 enema Rectal Once PRN Signa Kell, MD       traMADol Janean Sark) tablet 50 mg  50 mg Oral Q6H PRN Signa Kell, MD         Discharge Medications: Please see discharge summary for a list of discharge medications.  Relevant Imaging Results:  Relevant Lab Results:   Additional Information SS#: [id number]  Margarito Liner, LCSW

## 2022-08-18 DIAGNOSIS — S72001S Fracture of unspecified part of neck of right femur, sequela: Secondary | ICD-10-CM | POA: Diagnosis not present

## 2022-08-18 DIAGNOSIS — N1831 Chronic kidney disease, stage 3a: Secondary | ICD-10-CM

## 2022-08-18 DIAGNOSIS — D509 Iron deficiency anemia, unspecified: Secondary | ICD-10-CM

## 2022-08-18 DIAGNOSIS — N9489 Other specified conditions associated with female genital organs and menstrual cycle: Secondary | ICD-10-CM

## 2022-08-18 DIAGNOSIS — E440 Moderate protein-calorie malnutrition: Secondary | ICD-10-CM | POA: Diagnosis not present

## 2022-08-18 DIAGNOSIS — N183 Chronic kidney disease, stage 3 unspecified: Secondary | ICD-10-CM | POA: Insufficient documentation

## 2022-08-18 LAB — CBC
HCT: 19.7 % — ABNORMAL LOW (ref 36.0–46.0)
Hemoglobin: 6.4 g/dL — ABNORMAL LOW (ref 12.0–15.0)
MCH: 33.5 pg (ref 26.0–34.0)
MCHC: 32.5 g/dL (ref 30.0–36.0)
MCV: 103.1 fL — ABNORMAL HIGH (ref 80.0–100.0)
Platelets: 129 10*3/uL — ABNORMAL LOW (ref 150–400)
RBC: 1.91 MIL/uL — ABNORMAL LOW (ref 3.87–5.11)
RDW: 14.3 % (ref 11.5–15.5)
WBC: 10.7 10*3/uL — ABNORMAL HIGH (ref 4.0–10.5)
nRBC: 0 % (ref 0.0–0.2)

## 2022-08-18 LAB — BASIC METABOLIC PANEL
Anion gap: 7 (ref 5–15)
BUN: 25 mg/dL — ABNORMAL HIGH (ref 8–23)
CO2: 24 mmol/L (ref 22–32)
Calcium: 7.8 mg/dL — ABNORMAL LOW (ref 8.9–10.3)
Chloride: 105 mmol/L (ref 98–111)
Creatinine, Ser: 0.94 mg/dL (ref 0.44–1.00)
GFR, Estimated: 57 mL/min — ABNORMAL LOW (ref >60)
Glucose, Bld: 112 mg/dL — ABNORMAL HIGH (ref 70–99)
Potassium: 3.6 mmol/L (ref 3.5–5.1)
Sodium: 136 mmol/L (ref 135–145)

## 2022-08-18 LAB — HEMOGLOBIN AND HEMATOCRIT, BLOOD
HCT: 25.7 % — ABNORMAL LOW (ref 36.0–46.0)
Hemoglobin: 8.5 g/dL — ABNORMAL LOW (ref 12.0–15.0)

## 2022-08-18 LAB — PREPARE RBC (CROSSMATCH)

## 2022-08-18 MED ORDER — ACETAMINOPHEN 325 MG PO TABS
650.0000 mg | ORAL_TABLET | Freq: Once | ORAL | Status: AC
  Administered 2022-08-18: 650 mg via ORAL
  Filled 2022-08-18: qty 2

## 2022-08-18 MED ORDER — FUROSEMIDE 10 MG/ML IJ SOLN
20.0000 mg | Freq: Once | INTRAMUSCULAR | Status: AC
  Administered 2022-08-18: 20 mg via INTRAVENOUS
  Filled 2022-08-18: qty 4

## 2022-08-18 MED ORDER — SODIUM CHLORIDE 0.9 % IV SOLN
300.0000 mg | Freq: Once | INTRAVENOUS | Status: AC
  Administered 2022-08-18: 300 mg via INTRAVENOUS
  Filled 2022-08-18: qty 300

## 2022-08-18 MED ORDER — SODIUM CHLORIDE 0.9% IV SOLUTION: Freq: Once | INTRAVENOUS | Status: AC

## 2022-08-18 NOTE — Progress Notes (Signed)
Obtained blood transfusion consent from son, Tyrena Gohr, via telephone. Pt visually impaired. However, verbally gave consent to receive blood. Second RN verified telephone consent.

## 2022-08-18 NOTE — Assessment & Plan Note (Addendum)
Hemoglobin went from 10.6 down to 6.4.  Patient transfused 1 unit of packed red blood cells and hemoglobin came up to 7.9.  Received IV iron.  Hemoglobin stable at 7.9 upon discharge.

## 2022-08-18 NOTE — Progress Notes (Signed)
  Progress Note   Patient: Cassie Solis PZW:[id number] DOB: [birth year] DOA: 08/15/2022     3 DOS: the patient was seen and examined on 08/18/2022   Brief hospital course: 88 year old female with medical history significant of syncope, vertigo, CKD-3B, anemia, sinus bradycardia, orthostatic hypotension, left hip fracture in 01/2022, who presents with fall and right hip pain.  Patient states that she tripped her steps and fell accidentally when she got up and went to the bathroom in the early morning.   X-ray of right femur/pelvis that showed right intertrochanteric femoral neck fracture.  12/20.  Transfusing 1 unit of packed red blood cells and IV iron today for hemoglobin of 6.4.  Assessment and Plan: * Closed right hip fracture [facility]) Requiring operative repair.  Pain control.  Will need rehab.  Iron deficiency anemia Hemoglobin went from 10.6 down to 6.4.  Patient transfuse 1 unit of packed red blood cells and hemoglobin came up to 8.5.  Will give IV iron.  Protein-calorie malnutrition, moderate (HCC) Continue supplements  Chronic kidney disease (CKD), stage III (moderate) (HCC) CKD stage IIIa.  Last creatinine 0.94 with a GFR 57.  Adnexal mass Seen on previous CT scan of the abdomen.  The patient did not want any further treatment or evaluation for this because she is in her 90s.        Subjective: Patient's hemoglobin 6.5 this morning.  Patient feels okay.  No chest pain or shortness of breath.  Has some soreness in the right hip.  Physical Exam: Vitals:   08/18/22 0814 08/18/22 0846 08/18/22 0926 08/18/22 1159  BP: 135/69 112/62 125/72 (!) 143/72  Pulse: 81 84 84 76  Resp: 16 17 17 15   Temp: 98.6 F (37 C) 98.4 F (36.9 C) 97.7 F (36.5 C) 98.4 F (36.9 C)  TempSrc:   Oral   SpO2: 95% 98% 96% 98%  Weight:      Height:       Physical Exam HENT:     Head: Normocephalic.     Mouth/Throat:     Pharynx: No oropharyngeal exudate.  Eyes:     General: Lids are  normal.     Conjunctiva/sclera: Conjunctivae normal.  Cardiovascular:     Rate and Rhythm: Normal rate and regular rhythm.     Heart sounds: Normal heart sounds, S1 normal and S2 normal.  Pulmonary:     Breath sounds: No decreased breath sounds, wheezing, rhonchi or rales.  Abdominal:     Palpations: Abdomen is soft.     Tenderness: There is no abdominal tenderness.  Musculoskeletal:     Right lower leg: No swelling.     Left lower leg: No swelling.  Skin:    General: Skin is warm.     Findings: No rash.  Neurological:     Mental Status: She is alert.     Comments: Answers questions appropriately.     Data Reviewed: Creatinine 0.94 with a GFR 57, hemoglobin this morning 6.4.  Family Communication: Updated son on the phone  Disposition: Status is: Inpatient Remains inpatient appropriate because: Transfusion of packed red blood cells and IV iron today.  Benefits and risks of blood transfusion explained to patient.  Planned Discharge Destination: Rehab    Time spent: 28 minutes  Author: , MD 08/18/2022 2:49 PM  For on call review [email or web address].

## 2022-08-18 NOTE — Progress Notes (Signed)
  Subjective: 2 Days Post-Op Procedure(s) (LRB): INTRAMEDULLARY (IM) NAIL INTERTROCHANTERIC (Right) Patient reports pain as mild.  More alert today.  Feeling better. Patient is well, and has had no acute complaints or problems Plan is to go Rehab after hospital stay. Negative for chest pain and shortness of breath Fever: no Gastrointestinal: Negative for nausea and vomiting  Objective: Vital signs in last 24 hours: Temp:  [97.9 F (36.6 C)] 97.9 F (36.6 C) (12/19 2346) Pulse Rate:  [91] 91 (12/19 2346) Resp:  [20] 20 (12/19 2346) BP: (125)/(57) 125/57 (12/19 2346) SpO2:  [94 %] 94 % (12/19 2346)  Intake/Output from previous day:  Intake/Output Summary (Last 24 hours) at 08/18/2022 0726 Last data filed at 08/18/2022 0200 Gross per 24 hour  Intake 906.79 ml  Output 300 ml  Net 606.79 ml    Intake/Output this shift: No intake/output data recorded.  Labs: Recent Labs    08/15/22 0836 08/16/22 0521 08/17/22 0320  HGB 10.6* 8.7* 7.8*   Recent Labs    08/16/22 0521 08/17/22 0320  WBC 6.7 9.1  RBC 2.57* 2.27*  HCT 26.9* 24.0*  PLT 128* 116*   Recent Labs    08/16/22 0521 08/17/22 0320  NA 138 138  K 3.8 3.8  CL 107 108  CO2 27 25  BUN 29* 29*  CREATININE 0.89 1.12*  GLUCOSE 114* 108*  CALCIUM 8.6* 8.0*   Recent Labs    08/15/22 1159  INR 1.3*     EXAM General - Patient is Alert and Oriented Extremity - Sensation intact distally Dorsiflexion/Plantar flexion intact Compartment soft Dressing/Incision - clean, dry, blood tinged drainage Motor Function - intact, moving foot and toes well on exam.   Past Medical History:  Diagnosis Date   Adnexal mass    Anemia    Arthritis    left wrist, bilateral hips   Chronic kidney disease    Stage 3b   Lactic acidosis    Macular degeneration    Orthostatic hypotension    Osteoporosis    Seasonal allergies    Sinus bradycardia    Syncope 02/21/2022   Vertigo    rare    Assessment/Plan: 2 Days  Post-Op Procedure(s) (LRB): INTRAMEDULLARY (IM) NAIL INTERTROCHANTERIC (Right) Principal Problem:   Closed right hip fracture (HCC) Active Problems:   Stage 3b chronic kidney disease (CKD) (HCC)   Fall   Protein-calorie malnutrition, moderate (HCC)   Iron deficiency anemia   Depression  Estimated body mass index is 18.01 kg/m as calculated from the following:   Height as of this encounter: 5\' 4"  (1.626 m).   Weight as of this encounter: 47.6 kg. Advance diet Up with therapy  Acute blood loss anemia.  Status post hip fracture.  Hemoglobin 7.8.  Discharge planning.  Follow-up at [facility] clinic orthopedics in 2 weeks for x-rays of the right hip and staple removal.  DVT Prophylaxis - Lovenox, Foot Pumps, and TED hose Weight-Bearing as tolerated to right leg  [facility], PA-C Orthopaedic Surgery 08/18/2022, 7:26 AM

## 2022-08-18 NOTE — Assessment & Plan Note (Signed)
Seen on previous CT scan of the abdomen.  The patient did not want any further treatment or evaluation for this because she is in her 90s.

## 2022-08-18 NOTE — Hospital Course (Addendum)
88 year old female with medical history significant of syncope, vertigo, CKD-3B, anemia, sinus bradycardia, orthostatic hypotension, left hip fracture in 01/2022, who presents with fall and right hip pain.  Patient states that she tripped her steps and fell accidentally when she got up and went to the bathroom in the early morning.   X-ray of right femur/pelvis that showed right intertrochanteric femoral neck fracture.  12/20.  Transfusing 1 unit of packed red blood cells and IV iron today for hemoglobin of 6.4. 12/21.  Hemoglobin 7.9. 12/22.  Hemoglobin stable at 7.9.

## 2022-08-18 NOTE — Assessment & Plan Note (Signed)
CKD stage IIIa.  Last creatinine 0.94 with a GFR 57.

## 2022-08-18 NOTE — TOC Progression Note (Addendum)
Transition of Care [facility]) - Progression Note    Patient Details  Name: Cassie Solis MRN: [id number] Date of Birth: 1929/04/11  Transition of Care [facility]) CM/SW Contact  Marlowe Sax, RN Phone Number: 08/18/2022, 10:04 AM  Clinical Narrative:    TOC continues to follow the patient, Per the patient;s request I called Orvilla Fus the son at [phone] I reviewed the bed offers with Orvilla Fus the son, he is familiar with [facility] and chose that bed, I explained that Ins would also have to approve  He stated understanding I notified Verlon Au at [facility] to accept the bed offer  Called [facility] and spoke to [place], started Ins auth for both [facility] as well as Customer service manager  Planned Disposition: Skilled Nursing Facility Barriers to Discharge: English as a second language teacher  Expected Discharge Plan and Services       Living arrangements for the past 2 months: Single Family Home                                       Social Determinants of Health (SDOH) Interventions SDOH Screenings   Food Insecurity: No Food Insecurity (08/15/2022)  Housing: Low Risk  (08/15/2022)  Transportation Needs: No Transportation Needs (08/15/2022)  Utilities: Not At Risk (08/15/2022)  Tobacco Use: Unknown (08/17/2022)    Readmission Risk Interventions    01/08/2022    1:46 PM  Readmission Risk Prevention Plan  Transportation Screening Complete  PCP or Specialist Appt within 5-7 Days Complete  Home Care Screening Complete  Medication Review (RN CM) Complete

## 2022-08-18 NOTE — Assessment & Plan Note (Signed)
Continue supplements

## 2022-08-18 NOTE — Assessment & Plan Note (Addendum)
Requiring operative repair.  Pain control.  Stable for rehab.  Orthopedic surgery will bring prescription for pain medication and Lovenox.  Careful with pain medications and switch to Tylenol as quickly as possible.

## 2022-08-18 NOTE — Progress Notes (Addendum)
PT Cancellation Note  Patient Details Name: Cassie Solis MRN: [id number] DOB: [birth year]   Cancelled Treatment:    Reason Eval/Treat Not Completed: Medical issues which prohibited therapy  RN requested hold for today due to blood transfusion.   Hortencia Conradi, PTA  08/18/22, 9:41 AM

## 2022-08-19 DIAGNOSIS — E876 Hypokalemia: Secondary | ICD-10-CM | POA: Insufficient documentation

## 2022-08-19 DIAGNOSIS — D509 Iron deficiency anemia, unspecified: Secondary | ICD-10-CM | POA: Diagnosis not present

## 2022-08-19 DIAGNOSIS — S72001S Fracture of unspecified part of neck of right femur, sequela: Secondary | ICD-10-CM | POA: Diagnosis not present

## 2022-08-19 DIAGNOSIS — K5900 Constipation, unspecified: Secondary | ICD-10-CM | POA: Diagnosis not present

## 2022-08-19 DIAGNOSIS — E440 Moderate protein-calorie malnutrition: Secondary | ICD-10-CM | POA: Diagnosis not present

## 2022-08-19 LAB — TYPE AND SCREEN
ABO/RH(D): A POS
Antibody Screen: NEGATIVE
Unit division: 0

## 2022-08-19 LAB — CBC
HCT: 24.3 % — ABNORMAL LOW (ref 36.0–46.0)
Hemoglobin: 7.9 g/dL — ABNORMAL LOW (ref 12.0–15.0)
MCH: 31.6 pg (ref 26.0–34.0)
MCHC: 32.5 g/dL (ref 30.0–36.0)
MCV: 97.2 fL (ref 80.0–100.0)
Platelets: 148 10*3/uL — ABNORMAL LOW (ref 150–400)
RBC: 2.5 MIL/uL — ABNORMAL LOW (ref 3.87–5.11)
RDW: 18.3 % — ABNORMAL HIGH (ref 11.5–15.5)
WBC: 9.8 10*3/uL (ref 4.0–10.5)
nRBC: 0 % (ref 0.0–0.2)

## 2022-08-19 LAB — BASIC METABOLIC PANEL
Anion gap: 7 (ref 5–15)
BUN: 23 mg/dL (ref 8–23)
CO2: 26 mmol/L (ref 22–32)
Calcium: 7.9 mg/dL — ABNORMAL LOW (ref 8.9–10.3)
Chloride: 106 mmol/L (ref 98–111)
Creatinine, Ser: 0.85 mg/dL (ref 0.44–1.00)
GFR, Estimated: >60 mL/min (ref >60)
Glucose, Bld: 97 mg/dL (ref 70–99)
Potassium: 3.3 mmol/L — ABNORMAL LOW (ref 3.5–5.1)
Sodium: 139 mmol/L (ref 135–145)

## 2022-08-19 LAB — BPAM RBC
Blood Product Expiration Date: 202401202359
ISSUE DATE / TIME: 202312200907
Unit Type and Rh: 6200

## 2022-08-19 MED ORDER — POLYETHYLENE GLYCOL 3350 17 G PO PACK
17.0000 g | PACK | Freq: Every day | ORAL | Status: DC
  Administered 2022-08-19 – 2022-08-20 (×2): 17 g via ORAL
  Filled 2022-08-19 (×2): qty 1

## 2022-08-19 MED ORDER — POTASSIUM CHLORIDE CRYS ER 20 MEQ PO TBCR
40.0000 meq | EXTENDED_RELEASE_TABLET | Freq: Once | ORAL | Status: AC
  Administered 2022-08-19: 40 meq via ORAL
  Filled 2022-08-19: qty 2

## 2022-08-19 MED ORDER — ENSURE ENLIVE PO LIQD
237.0000 mL | Freq: Two times a day (BID) | ORAL | Status: DC
  Administered 2022-08-19 – 2022-08-20 (×3): 237 mL via ORAL

## 2022-08-19 MED ORDER — LACTULOSE 10 GM/15ML PO SOLN
20.0000 g | Freq: Once | ORAL | Status: AC
  Administered 2022-08-19: 20 g via ORAL
  Filled 2022-08-19: qty 30

## 2022-08-19 MED ORDER — POLYETHYLENE GLYCOL 3350 17 G PO PACK
17.0000 g | PACK | Freq: Once | ORAL | Status: AC
  Administered 2022-08-19: 17 g via ORAL
  Filled 2022-08-19: qty 1

## 2022-08-19 NOTE — Assessment & Plan Note (Addendum)
Patient stated that she had a bowel movement see 1 documented.  Spoke with nursing staff to clarify this and she had a bowel movement.  Stable for discharge if he had a bowel movement if not we will have to work on 1 this morning.

## 2022-08-19 NOTE — Progress Notes (Signed)
Nutrition Follow-up  DOCUMENTATION CODES:   Underweight, Non-severe (moderate) malnutrition in context of chronic illness  INTERVENTION:   -Ensure Enlive po BID, each supplement provides 350 kcal and 20 grams of protein -Continue regular diet  NUTRITION DIAGNOSIS:   Moderate Malnutrition related to chronic illness (CKD) as evidenced by mild fat depletion, moderate fat depletion, mild muscle depletion, moderate muscle depletion.  Ongoing  GOAL:   Patient will meet greater than or equal to 90% of their needs  Progressing   MONITOR:   PO intake, Supplement acceptance  REASON FOR ASSESSMENT:   Consult Assessment of nutrition requirement/status, Hip fracture protocol  ASSESSMENT:   Pt with medical history significant of syncope, vertigo, CKD-3B, anemia, sinus bradycardia, orthostatic hypotension, left hip fracture, who presents with fall and right hip pain.  12/18- s/p Intramedullary nailing of Right femur with cephalomedullary device   Reviewed I/O's: -83 ml x 24 hours and +1.4 L since admission  UOP: 700 ml x 24 hours  Spoke with pt at bedside, who was pleasant and in good spirits today. Pt consumed about 75% of breakfast today. Per pt, she has a good appetite and joked that she received "too much gravy" with her breakfast this morning. She is on a regular diet and denies any difficulty tolerating foods at this time. PTA, pt shares that she consumes 2-3 meals per day, which are prepared by family members and friends that live nearby. She consumes one Ensure supplement daily in the morning.   Pt denies any weight loss. She reports her UBW is around 105#.  Reviewed wt hx; wt has been stable over the past 6 months.   Discussed importance of good meal and supplement intake to promote healing. Pt amenable to supplements.   Per TOC notes, plan to d/c to SNF once medically stable.   Medications reviewed and include colace, ferrous sulfate, and miralax.  Labs reviewed: K:  3.3.  NUTRITION - FOCUSED PHYSICAL EXAM:  Flowsheet Row Most Recent Value  Orbital Region Moderate depletion  Upper Arm Region Mild depletion  Thoracic and Lumbar Region Mild depletion  Buccal Region Mild depletion  Temple Region Moderate depletion  Clavicle Bone Region Moderate depletion  Clavicle and Acromion Bone Region Moderate depletion  Scapular Bone Region Moderate depletion  Dorsal Hand Mild depletion  Patellar Region Mild depletion  Anterior Thigh Region Mild depletion  Posterior Calf Region Mild depletion  Edema (RD Assessment) None  Hair Reviewed  Eyes Reviewed  Mouth Reviewed  Skin Reviewed  Nails Reviewed       Diet Order:   Diet Order             Diet regular Room service appropriate? Yes; Fluid consistency: Thin  Diet effective now                   EDUCATION NEEDS:   Education needs have been addressed  Skin:  Skin Assessment: Skin Integrity Issues: Skin Integrity Issues:: Incisions Incisions: closed rt hip  Last BM:  08/15/22  Height:   Ht Readings from Last 1 Encounters:  08/16/22 5\' 4"  (1.626 m)    Weight:   Wt Readings from Last 1 Encounters:  08/16/22 47.6 kg    Ideal Body Weight:  54.5 kg  BMI:  Body mass index is 18.01 kg/m.  Estimated Nutritional Needs:   Kcal:  1450-1650  Protein:  70-85 grams  Fluid:  > 1.4 L    08/18/22, RD, LDN, CDCES Registered Dietitian II Certified Diabetes Care and  Education Specialist Please refer to [facility] for RD and/or RD on-call/weekend/after hours pager

## 2022-08-19 NOTE — Plan of Care (Signed)
  Problem: Clinical Measurements: Goal: Diagnostic test results will improve Outcome: Progressing   Problem: Activity: Goal: Risk for activity intolerance will decrease Outcome: Progressing   Problem: Nutrition: Goal: Adequate nutrition will be maintained Outcome: Progressing   Problem: Coping: Goal: Level of anxiety will decrease Outcome: Progressing   Problem: Elimination: Goal: Will not experience complications related to bowel motility Outcome: Progressing Goal: Will not experience complications related to urinary retention Outcome: Progressing   Problem: Pain Managment: Goal: General experience of comfort will improve Outcome: Progressing   

## 2022-08-19 NOTE — Progress Notes (Signed)
  Subjective: 3 Days Post-Op Procedure(s) (LRB): INTRAMEDULLARY (IM) NAIL INTERTROCHANTERIC (Right) Patient reports pain as mild.  More alert today.  Feeling better today. Patient is well, and has had no acute complaints or problems Plan is to go Rehab after hospital stay. Negative for chest pain and shortness of breath Fever: no Gastrointestinal: Negative for nausea and vomiting  Objective: Vital signs in last 24 hours: Temp:  [97.5 F (36.4 C)-99.5 F (37.5 C)] 97.6 F (36.4 C) (12/21 0422) Pulse Rate:  [76-84] 77 (12/21 0422) Resp:  [15-20] 20 (12/21 0422) BP: (112-143)/(61-76) 123/61 (12/21 0422) SpO2:  [91 %-99 %] 97 % (12/21 0422)  Intake/Output from previous day:  Intake/Output Summary (Last 24 hours) at 08/19/2022 0710 Last data filed at 08/18/2022 1640 Gross per 24 hour  Intake 616.77 ml  Output 700 ml  Net -83.23 ml    Intake/Output this shift: No intake/output data recorded.  Labs: Recent Labs    08/17/22 0320 08/18/22 0655 08/18/22 1303 08/19/22 0334  HGB 7.8* 6.4* 8.5* 7.9*   Recent Labs    08/18/22 0655 08/18/22 1303 08/19/22 0334  WBC 10.7*  --  9.8  RBC 1.91*  --  2.50*  HCT 19.7* 25.7* 24.3*  PLT 129*  --  148*   Recent Labs    08/18/22 0655 08/19/22 0334  NA 136 139  K 3.6 3.3*  CL 105 106  CO2 24 26  BUN 25* 23  CREATININE 0.94 0.85  GLUCOSE 112* 97  CALCIUM 7.8* 7.9*   No results for input(s): "LABPT", "INR" in the last 72 hours.    EXAM General - Patient is Alert and Oriented Extremity - Sensation intact distally Dorsiflexion/Plantar flexion intact Compartment soft Dressing/Incision - clean, dry, blood tinged drainage Motor Function - intact, moving foot and toes well on exam.   Past Medical History:  Diagnosis Date   Adnexal mass    Anemia    Arthritis    left wrist, bilateral hips   Chronic kidney disease    Stage 3b   Lactic acidosis    Macular degeneration    Orthostatic hypotension    Osteoporosis     Seasonal allergies    Sinus bradycardia    Syncope 02/21/2022   Vertigo    rare    Assessment/Plan: 3 Days Post-Op Procedure(s) (LRB): INTRAMEDULLARY (IM) NAIL INTERTROCHANTERIC (Right) Principal Problem:   Closed right hip fracture (HCC) Active Problems:   Adnexal mass   Protein-calorie malnutrition, moderate (HCC)   Iron deficiency anemia   Depression   Chronic kidney disease (CKD), stage III (moderate) (HCC)  Estimated body mass index is 18.01 kg/m as calculated from the following:   Height as of this encounter: 5\' 4"  (1.626 m).   Weight as of this encounter: 47.6 kg. Advance diet Up with therapy  Acute blood loss anemia.  Status post hip fracture.  Hemoglobin 7.9 after receiving 1 unit of transfused blood yesterday.  Discharge planning.  Follow-up at [facility] clinic orthopedics in 2 weeks for x-rays of the right hip and staple removal.  DVT Prophylaxis - Lovenox, Foot Pumps, and TED hose Weight-Bearing as tolerated to right leg  [facility], PA-C Orthopaedic Surgery 08/19/2022, 7:10 AM

## 2022-08-19 NOTE — TOC Progression Note (Signed)
Transition of Care [facility]) - Progression Note    Patient Details  Name: JOYCE HEITMAN MRN: [id number] Date of Birth: 1928/10/05  Transition [facility] Care [facility]) CM/SW Contact  Marlowe Sax, RN Phone Number: 08/19/2022, 3:11 PM  Clinical Narrative:    Received a call from [place] at [facility], they approved her to go to [facility] approval number [id number], [facility] approval [id number]   Planned Disposition: Skilled Nursing Facility Barriers to Discharge: Insurance Authorization  Expected Discharge Plan and Services       Living arrangements for the past 2 months: Single Family Home                                       Social Determinants of Health (SDOH) Interventions SDOH Screenings   Food Insecurity: No Food Insecurity (08/15/2022)  Housing: Low Risk  (08/15/2022)  Transportation Needs: No Transportation Needs (08/15/2022)  Utilities: Not At Risk (08/15/2022)  Tobacco Use: Unknown (08/17/2022)    Readmission Risk Interventions    01/08/2022    1:46 PM  Readmission Risk Prevention Plan  Transportation Screening Complete  PCP or Specialist Appt within 5-7 Days Complete  Home Care Screening Complete  Medication Review (RN CM) Complete

## 2022-08-19 NOTE — Progress Notes (Signed)
Physical Therapy Treatment Patient Details Name: Cassie Solis MRN: [id number] DOB: September 14, 1928 Today's Date: 08/19/2022   History of Present Illness Cassie Solis is a 88 y.o. female with medical history significant of syncope, vertigo, CKD-3B, anemia, sinus bradycardia, orthostatic hypotension, left hip fracture in 01/2022, who presents with fall and right hip pain. S/p R hip IM nailing 08/16/22.    PT Comments    Pt received in bed this am, no pain at rest which increases with ROM and weight bearing, Tylenol requested. Pt required ModA for bed mobility with HOB raised and bed linen to slide to edge. Donned pt's personal shoes with L built up shoe due to leg length discrepancy. Pt required Min/ModA for sit<>stand transfers due to difficulty accepting weight through L LE. BSC transfer and short distance gait to recliner with RW and MinA with heavy vc's for weight shifting and sequencing. High fear of falling. Pt positioned to comfort in recliner, minimal c/o fatigue, lightheadedness with attempt for a BM. Ice placed at Right lateral hip. Continue per POC, expect transition to SNF today.     Recommendations for follow up therapy are one component of a multi-disciplinary discharge planning process, led by the attending physician.  Recommendations may be updated based on patient status, additional functional criteria and insurance authorization.  Follow Up Recommendations  Skilled nursing-short term rehab (<3 hours/day) Can patient physically be transported by private vehicle: No   Assistance Recommended at Discharge Frequent or constant Supervision/Assistance  Patient can return home with the following A lot of help with bathing/dressing/bathroom;Assist for transportation;Assistance with cooking/housework;A lot of help with walking and/or transfers;Help with stairs or ramp for entrance   Equipment Recommendations  Other (comment) (TBD at next facility)    Recommendations for Other Services        Precautions / Restrictions Precautions Precautions: Fall Restrictions Weight Bearing Restrictions: Yes RLE Weight Bearing: Weight bearing as tolerated     Mobility  Bed Mobility Overal bed mobility: Needs Assistance Bed Mobility: Supine to Sit, Sit to Supine     Supine to sit: HOB elevated, Mod assist Sit to supine: Mod assist   General bed mobility comments: Requires Mod A for maneuvering b/l LE    Transfers Overall transfer level: Needs assistance Equipment used: Rolling walker (2 wheels) Transfers: Sit to/from Stand, Bed to chair/wheelchair/BSC Sit to Stand: Min assist Stand pivot transfers: Min assist         General transfer comment: decreased tolerance for weight acceptance through R LE despite having shoe orthotic on Left    Ambulation/Gait Ambulation/Gait assistance: Min assist Gait Distance (Feet): 5 Feet Assistive device: Rolling walker (2 wheels) Gait Pattern/deviations: Step-to pattern, Decreased weight shift to right, Trunk flexed       General Gait Details: Pt struggling accepting weight through L LE in standing   Stairs             Wheelchair Mobility    Modified Rankin (Stroke Patients Only)       Balance Overall balance assessment: Needs assistance Sitting-balance support: Bilateral upper extremity supported, Feet unsupported Sitting balance-Leahy Scale: Fair     Standing balance support: Reliant on assistive device for balance, Bilateral upper extremity supported Standing balance-Leahy Scale: Poor                              Cognition Arousal/Alertness: Awake/alert Behavior During Therapy: WFL for tasks assessed/performed Overall Cognitive Status: Within Functional Limits for tasks  assessed                                 General Comments:  (Very pleasant and motivated)        Exercises General Exercises - Lower Extremity Ankle Circles/Pumps: AROM, Both, 15 reps Gluteal Sets:  Strengthening, Both, 10 reps, Supine Heel Slides: AAROM, Strengthening, Right, 10 reps, Supine Hip ABduction/ADduction: AAROM, Strengthening, Right, 10 reps, Supine    General Comments General comments (skin integrity, edema, etc.):  (Pt and son educated on role of PT and current goals.)      Pertinent Vitals/Pain Pain Assessment Pain Assessment: 0-10 Pain Score: 4  Pain Location: R hip Pain Descriptors / Indicators: Aching, Discomfort Pain Intervention(s): Monitored during session, Repositioned, Ice applied    Home Living                          Prior Function            PT Goals (current goals can now be found in the care plan section) Acute Rehab PT Goals Patient Stated Goal: improve mobility Progress towards PT goals: Progressing toward goals    Frequency    7X/week      PT Plan Current plan remains appropriate    Co-evaluation              AM-PAC PT "6 Clicks" Mobility   Outcome Measure  Help needed turning from your back to your side while in a flat bed without using bedrails?: A Little Help needed moving from lying on your back to sitting on the side of a flat bed without using bedrails?: A Little Help needed moving to and from a bed to a chair (including a wheelchair)?: A Lot Help needed standing up from a chair using your arms (e.g., wheelchair or bedside chair)?: A Lot Help needed to walk in hospital room?: Total Help needed climbing 3-5 steps with a railing? : Total 6 Click Score: 12    End of Session Equipment Utilized During Treatment: Gait belt Activity Tolerance: Patient tolerated treatment well Patient left: in chair;with call bell/phone within reach;with family/visitor present Nurse Communication: Mobility status PT Visit Diagnosis: Unsteadiness on feet (R26.81);Other abnormalities of gait and mobility (R26.89);Muscle weakness (generalized) (M62.81)     Time: 5456-2563 PT Time Calculation (min) (ACUTE ONLY): 51  min  Charges:  $Gait Training: 8-22 mins $Therapeutic Exercise: 8-22 mins $Therapeutic Activity: 8-22 mins                    Zadie Cleverly, PTA    Jannet Askew 08/19/2022, 1:18 PM

## 2022-08-19 NOTE — Assessment & Plan Note (Addendum)
Replaced. °

## 2022-08-19 NOTE — TOC Progression Note (Signed)
Transition of Care [facility]) - Progression Note    Patient Details  Name: Cassie Solis MRN: [id number] Date of Birth: September 16, 1928  Transition of Care [facility]) CM/SW Contact  Marlowe Sax, RN Phone Number: 08/19/2022, 10:51 AM  Clinical Narrative:    Jeanene Erb and Spoke to [place] at [facility] They would like to see more notes from PT, I reached out to PT and am awaiting their notes to send to [facility] Ins Her Hbg is above 7 today   Planned Disposition: Skilled Nursing Facility Barriers to Discharge: Insurance Authorization  Expected Discharge Plan and Services       Living arrangements for the past 2 months: Single Family Home                                       Social Determinants of Health (SDOH) Interventions SDOH Screenings   Food Insecurity: No Food Insecurity (08/15/2022)  Housing: Low Risk  (08/15/2022)  Transportation Needs: No Transportation Needs (08/15/2022)  Utilities: Not At Risk (08/15/2022)  Tobacco Use: Unknown (08/17/2022)    Readmission Risk Interventions    01/08/2022    1:46 PM  Readmission Risk Prevention Plan  Transportation Screening Complete  PCP or Specialist Appt within 5-7 Days Complete  Home Care Screening Complete  Medication Review (RN CM) Complete

## 2022-08-19 NOTE — Progress Notes (Signed)
Progress Note   Patient: Cassie Solis [id number]:[id number] DOB: 1928/09/28 DOA: 08/15/2022     4 DOS: the patient was seen and examined on 08/19/2022   Brief hospital course: 88 year old female with medical history significant of syncope, vertigo, CKD-3B, anemia, sinus bradycardia, orthostatic hypotension, left hip fracture in 01/2022, who presents with fall and right hip pain.  Patient states that she tripped her steps and fell accidentally when she got up and went to the bathroom in the early morning.   X-ray of right femur/pelvis that showed right intertrochanteric femoral neck fracture.  12/20.  Transfusing 1 unit of packed red blood cells and IV iron today for hemoglobin of 6.4. 12/21.  Hemoglobin 7.9.  Assessment and Plan: * Closed right hip fracture [facility]) Requiring operative repair.  Pain control.  Will need rehab.  Got insurance authorization late in the day and facility will accept for tomorrow.  Iron deficiency anemia Hemoglobin went from 10.6 down to 6.4.  Patient transfused 1 unit of packed red blood cells and hemoglobin came up to 7.9.  Received IV iron yesterday.  Protein-calorie malnutrition, moderate (HCC) Continue supplements  Hypokalemia Oral replacement  Constipation Trying to get a bowel movement today.  2 doses of MiraLAX given this morning and will give a dose of lactulose this afternoon.  Chronic kidney disease (CKD), stage III (moderate) (HCC) CKD stage IIIa.  Last creatinine 0.85  Adnexal mass Seen on previous CT scan of the abdomen.  The patient did not want any further treatment or evaluation for this because she is in her 90s.        Subjective: Patient feeling okay.  Seen this morning and had not had a bowel movement since surgery.  Came in after a fall and found to have a right hip fracture.  Physical Exam: Vitals:   08/18/22 1936 08/18/22 2330 08/19/22 0422 08/19/22 0820  BP: 117/75 127/65 123/61 118/70  Pulse: 77 82 77 82  Resp: 20 20 20 16    Temp: 99.3 F (37.4 C) 99.5 F (37.5 C) 97.6 F (36.4 C) 98.5 F (36.9 C)  TempSrc:  Oral    SpO2: 99% 93% 97% 95%  Weight:      Height:       Physical Exam HENT:     Head: Normocephalic.     Mouth/Throat:     Pharynx: No oropharyngeal exudate.  Eyes:     General: Lids are normal.     Conjunctiva/sclera: Conjunctivae normal.  Cardiovascular:     Rate and Rhythm: Normal rate and regular rhythm.     Heart sounds: Normal heart sounds, S1 normal and S2 normal.  Pulmonary:     Breath sounds: No decreased breath sounds, wheezing, rhonchi or rales.  Abdominal:     Palpations: Abdomen is soft.     Tenderness: There is no abdominal tenderness.  Musculoskeletal:     Right lower leg: No swelling.     Left lower leg: No swelling.  Skin:    General: Skin is warm.     Findings: No rash.  Neurological:     Mental Status: She is alert.     Comments: Answers questions appropriately.     Data Reviewed: Potassium 3.3, hemoglobin 7.9  Family Communication: Spoke with son on the phone  Disposition: Status is: Inpatient Remains inpatient appropriate because: Received insurance authorization late this afternoon and facility will not take until tomorrow.  Planned Discharge Destination: Rehab    Time spent: 28 minutes  Author: , MD  08/19/2022 4:18 PM  For on call review [email or web address].

## 2022-08-20 DIAGNOSIS — D509 Iron deficiency anemia, unspecified: Secondary | ICD-10-CM | POA: Diagnosis not present

## 2022-08-20 DIAGNOSIS — S72001S Fracture of unspecified part of neck of right femur, sequela: Secondary | ICD-10-CM | POA: Diagnosis not present

## 2022-08-20 DIAGNOSIS — N1831 Chronic kidney disease, stage 3a: Secondary | ICD-10-CM | POA: Diagnosis not present

## 2022-08-20 DIAGNOSIS — N9489 Other specified conditions associated with female genital organs and menstrual cycle: Secondary | ICD-10-CM | POA: Diagnosis not present

## 2022-08-20 DIAGNOSIS — E876 Hypokalemia: Secondary | ICD-10-CM

## 2022-08-20 LAB — HEMOGLOBIN: Hemoglobin: 7.9 g/dL — ABNORMAL LOW (ref 12.0–15.0)

## 2022-08-20 LAB — POTASSIUM: Potassium: 4 mmol/L (ref 3.5–5.1)

## 2022-08-20 MED ORDER — DOCUSATE SODIUM 100 MG PO CAPS: 100.0000 mg | ORAL_CAPSULE | Freq: Two times a day (BID) | ORAL | 0 refills | Status: AC

## 2022-08-20 MED ORDER — LACTULOSE 10 GM/15ML PO SOLN: 30.0000 g | Freq: Every day | ORAL | Status: DC | PRN

## 2022-08-20 NOTE — Discharge Summary (Signed)
Physician Discharge Summary   Patient: Cassie Solis MRN: [id number] DOB: 10/05/1928  Admit date:     08/15/2022  Discharge date: 08/20/22  Discharge Physician: Loletha Grayer   PCP: Leonides Sake, MD   Recommendations at discharge:   Follow-up with rehab Follow-up orthopedics.  Discharge Diagnoses: Principal Problem:   Closed right hip fracture (HCC) Active Problems:   Iron deficiency anemia   Depression   Protein-calorie malnutrition, moderate (HCC)   Adnexal mass   Chronic kidney disease (CKD), stage III (moderate) (HCC)   Constipation   Hypokalemia    Hospital Course: 88 year old female with medical history significant of syncope, vertigo, CKD-3B, anemia, sinus bradycardia, orthostatic hypotension, left hip fracture in 01/2022, who presents with fall and right hip pain.  Patient states that she tripped her steps and fell accidentally when she got up and went to the bathroom in the early morning.   X-ray of right femur/pelvis that showed right intertrochanteric femoral neck fracture.  12/20.  Transfusing 1 unit of packed red blood cells and IV iron today for hemoglobin of 6.4. 12/21.  Hemoglobin 7.9. 12/22.  Hemoglobin stable at 7.9.  Assessment and Plan: * Closed right hip fracture [facility]) Requiring operative repair.  Pain control.  Stable for rehab.  Orthopedic surgery will bring prescription for pain medication and Lovenox.  Careful with pain medications and switch to Tylenol as quickly as possible.  Iron deficiency anemia Hemoglobin went from 10.6 down to 6.4.  Patient transfused 1 unit of packed red blood cells and hemoglobin came up to 7.9.  Received IV iron.  Hemoglobin stable at 7.9 upon discharge.    Protein-calorie malnutrition, moderate ([employer]) Continue supplements  Hypokalemia Replaced  Constipation Patient stated that she had a bowel movement see 1 documented.  Spoke with nursing staff to clarify this and she had a bowel movement.  Stable for  discharge if he had a bowel movement if not we will have to work on 1 this morning.  Chronic kidney disease (CKD), stage III (moderate) (HCC) CKD stage IIIa.  Last creatinine 0.85  Adnexal mass Seen on previous CT scan of the abdomen.  The patient did not want any further treatment or evaluation for this because she is in her 18s.         Consultants: Orthopedic surgery Procedures performed: Intramedullary nailing of right femur with cephalomedullary device Disposition: Rehabilitation facility Diet recommendation:  Regular diet DISCHARGE MEDICATION: Allergies as of 08/20/2022       Reactions   Azithromycin Nausea Only   GI issues        Medication List     STOP taking these medications    methocarbamol 500 MG tablet Commonly known as: ROBAXIN       TAKE these medications    acetaminophen 325 MG tablet Commonly known as: TYLENOL Take 1-2 tablets (325-650 mg total) by mouth every 6 (six) hours as needed for mild pain or moderate pain (pain score 1-3 or temp > 100.5).   CITRACAL PO Take 1 tablet by mouth daily. AM   docusate sodium 100 MG capsule Commonly known as: COLACE Take 1 capsule (100 mg total) by mouth 2 (two) times daily.   enoxaparin 40 MG/0.4ML injection Commonly known as: LOVENOX Inject 0.4 mLs (40 mg total) into the skin daily for 14 days.   feeding supplement Liqd Take 237 mLs by mouth 2 (two) times daily between meals.   FeroSul 325 (65 FE) MG tablet Generic drug: ferrous sulfate Take 325 mg by  mouth daily with breakfast.   ferrous [id number] C-folic acid capsule Commonly known as: TRINSICON / FOLTRIN Take 1 capsule by mouth 2 (two) times daily after a meal.   mirtazapine 15 MG tablet Commonly known as: REMERON Take 15 mg by mouth at bedtime.   oxyCODONE 5 MG immediate release tablet Commonly known as: Oxy IR/ROXICODONE Take 0.5-1 tablets (2.5-5 mg total) by mouth every 4 (four) hours as needed for moderate pain (pain  score 4-6).   polyethylene glycol 17 g packet Commonly known as: MIRALAX / GLYCOLAX Take 17 g by mouth daily as needed for mild constipation.   PreserVision AREDS 2+Multi Vit Caps Take 1 capsule by mouth daily.   traMADol 50 MG tablet Commonly known as: ULTRAM Take 1 tablet (50 mg total) by mouth every 6 (six) hours as needed for moderate pain.        Contact information for follow-up providers     [facility] [place], [place]. Schedule an appointment as soon as possible for a visit in 2 week(s).   Specialty: Orthopedic Surgery Why: X-rays of the right hip and staple removal. Contact information: [address] [place] [address] [phone]              Contact information for after-discharge care     [facility] Preferred SNF .   Service: Skilled Nursing Contact information: [place] [phone]                    Discharge Exam: Danley Danker Weights   08/15/22 [id number] 08/16/22 1310  Weight: 47.6 kg 47.6 kg   Physical Exam HENT:     Head: Normocephalic.     Mouth/Throat:     Pharynx: No oropharyngeal exudate.  Eyes:     General: Lids are normal.     Conjunctiva/sclera: Conjunctivae normal.  Cardiovascular:     Rate and Rhythm: Normal rate and regular rhythm.     Heart sounds: Normal heart sounds, S1 normal and S2 normal.  Pulmonary:     Breath sounds: No decreased breath sounds, wheezing, rhonchi or rales.  Abdominal:     Palpations: Abdomen is soft.     Tenderness: There is no abdominal tenderness.  Musculoskeletal:     Right lower leg: No swelling.     Left lower leg: No swelling.  Skin:    General: Skin is warm.     Findings: No rash.  Neurological:     Mental Status: She is alert.     Comments: Answers questions appropriately.      Condition at discharge: stable  The results of  significant diagnostics from this hospitalization (including imaging, microbiology, ancillary and laboratory) are listed below for reference.   Imaging Studies: DG HIP UNILAT WITH PELVIS 2-3 VIEWS RIGHT  Result Date: 08/16/2022 CLINICAL DATA:  Surgery, like deve EXAM: DG HIP (WITH OR WITHOUT PELVIS) 2-3V RIGHT COMPARISON:  None Available. FINDINGS: Intraoperative images during right hip ORIF. Improved intertrochanteric fracture alignment. No evidence of immediate complication. IMPRESSION: Intraoperative images during right hip ORIF. Improved fracture alignment without evidence of immediate complication. Electronically Signed   By: Maurine Simmering M.D.   On: 08/16/2022 16:44   DG C-Arm 1-60 Min-No Report  Result Date: 08/16/2022 Fluoroscopy was utilized by the requesting physician.  No radiographic interpretation.   DG Chest 1 View  Result Date: 08/15/2022 CLINICAL DATA:  [id number] Fall, initial encounter [id number] EXAM: CHEST  1 VIEW COMPARISON:  01/06/2022 FINDINGS: The heart size and mediastinal contours are within normal limits. There is diffuse pulmonary interstitial prominence which could be seen with atypical infection, edema or interstitial lung disease. There is no focal consolidation. Aorta is calcified. The visualized skeletal structures are unremarkable. IMPRESSION: Nonspecific interstitial prominence consistent with atypical infection, edema or interstitial lung disease. No focal consolidation. Electronically Signed   By: Sammie Bench M.D.   On: 08/15/2022 08:31   DG Tibia/Fibula Right  Result Date: 08/15/2022 CLINICAL DATA:  Fall EXAM: RIGHT TIBIA AND FIBULA - 2 VIEW COMPARISON:  None Available. FINDINGS: Osseous structures appear osteopenic. There is no evidence of fracture or other focal bone lesions. Soft tissues are unremarkable. IMPRESSION: Osteopenia.  No acute osseous abnormalities. Electronically Signed   By: Sammie Bench M.D.   On: 08/15/2022 08:30   DG Femur Min 2 Views  Right  Result Date: 08/15/2022 CLINICAL DATA:  Fall EXAM: RIGHT FEMUR 2 VIEWS; DG HIP (WITH OR WITHOUT PELVIS) 3V RIGHT COMPARISON:  None Available. FINDINGS: Osseous structures osteopenic. Patient is status post previous ORIF left femoral neck. Pelvic ring is intact. Lumbosacral degenerative changes are noted. There is an intertrochanteric fracture right femoral neck with varus angulation and minimal displacement. Distal portions of the right femur are intact. IMPRESSION: Intertrochanteric right femoral neck fracture. Electronically Signed   By: Sammie Bench M.D.   On: 08/15/2022 08:30   DG Hip Unilat W or Wo Pelvis 2-3 Views Right  Result Date: 08/15/2022 CLINICAL DATA:  Fall EXAM: RIGHT FEMUR 2 VIEWS; DG HIP (WITH OR WITHOUT PELVIS) 3V RIGHT COMPARISON:  None Available. FINDINGS: Osseous structures osteopenic. Patient is status post previous ORIF left femoral neck. Pelvic ring is intact. Lumbosacral degenerative changes are noted. There is an intertrochanteric fracture right femoral neck with varus angulation and minimal displacement. Distal portions of the right femur are intact. IMPRESSION: Intertrochanteric right femoral neck fracture. Electronically Signed   By: Sammie Bench M.D.   On: 08/15/2022 08:30   DG Ankle 2 Views Right  Result Date: 08/15/2022 CLINICAL DATA:  Fall EXAM: RIGHT ANKLE - 2 VIEW COMPARISON:  None Available. FINDINGS: There is no evidence of fracture, dislocation, or joint effusion. There is no evidence of arthropathy or other focal bone abnormality. Soft tissues are unremarkable. Small plantar and large posterior calcaneal spurs are IMPRESSION: Calcaneal spurs.  No acute osseous abnormalities. Electronically Signed   By: Sammie Bench M.D.   On: 08/15/2022 08:28   DG Knee Complete 4 Views Right  Result Date: 08/15/2022 CLINICAL DATA:  fall EXAM: RIGHT KNEE - COMPLETE 4 VIEW COMPARISON:  None Available. FINDINGS: No evidence of fracture, dislocation, or joint  effusion. There is lateral compartment joint space narrowing, sclerosis and osteophytes consistent with degenerative joint disease. No evidence of effusion. IMPRESSION: Degenerative changes. Electronically Signed   By: Sammie Bench M.D.   On: 08/15/2022 08:27   CT Cervical Spine Wo Contrast  Result Date: 08/15/2022 CLINICAL DATA:  88 year old female status post fall while going to bathroom. Pain. EXAM: CT CERVICAL SPINE WITHOUT CONTRAST TECHNIQUE: Multidetector CT imaging of the cervical spine was performed without intravenous contrast. Multiplanar CT image reconstructions were also generated. RADIATION DOSE REDUCTION: This exam was performed according to the departmental dose-optimization program which includes automated exposure control, adjustment of the mA and/or kV according to patient size and/or use of iterative reconstruction technique. COMPARISON:  Head CT today. FINDINGS: Alignment: Mild straightening of cervical  lordosis. Mild degenerative appearing anterolisthesis of C5 on C6 with chronic facet degeneration on the left. Similar C7-T1 anterolisthesis with bilateral facet degeneration. Bilateral posterior element alignment is within normal limits. Skull base and vertebrae: Bone mineralization is within normal limits for age. Visualized skull base is intact. No atlanto-occipital dissociation. C1 and C2 appear intact and aligned. No acute osseous abnormality identified. Soft tissues and spinal canal: No prevertebral fluid or swelling. No visible canal hematoma. Bulky calcified cervical carotid atherosclerosis and mild motion artifact in the visible neck. Disc levels: C3-C4 degenerative appearing ankylosis including by of the left facet. Adjacent left side facet arthropathy at multiple levels. Chronic disc and endplate degeneration. But fairly capacious appearance of the cervical spinal canal by CT. Mild if any spinal stenosis suspected. Upper chest: Partially calcified apical lung scarring.  Calcified aortic atherosclerosis. Grossly intact visible upper thoracic levels. IMPRESSION: 1. No acute traumatic injury identified in the cervical spine. 2. Fairly age-appropriate cervical spine degeneration with some degenerative spondylolisthesis and ankylosis. 3. Calcified cervical carotid atherosclerosis. Aortic Atherosclerosis (ICD10-I70.0). Electronically Signed   By: Odessa Fleming M.D.   On: 08/15/2022 07:34   CT Head Wo Contrast  Result Date: 08/15/2022 CLINICAL DATA:  88 year old female status post fall while going to bathroom. Pain. EXAM: CT HEAD WITHOUT CONTRAST TECHNIQUE: Contiguous axial images were obtained from the base of the skull through the vertex without intravenous contrast. RADIATION DOSE REDUCTION: This exam was performed according to the departmental dose-optimization program which includes automated exposure control, adjustment of the mA and/or kV according to patient size and/or use of iterative reconstruction technique. COMPARISON:  Head CT 01/06/2022 and earlier. FINDINGS: Brain: Stable cerebral volume. No midline shift, ventriculomegaly, mass effect, evidence of mass lesion, intracranial hemorrhage or evidence of cortically based acute infarction. Stable gray-white matter differentiation throughout the brain. Vascular: Calcified atherosclerosis at the skull base. No suspicious intracranial vascular hyperdensity. Skull: Stable hyperostosis, intact. Sinuses/Orbits: Visualized paranasal sinuses and mastoids are stable and well aerated. Other: Broad-based right posterior scalp hematoma measures up to 15 mm in thickness, roughly 5.5 cm diameter. No scalp soft tissue gas. Other scalp and orbits soft tissues appears stable. Underlying calvarium appears stable and intact. IMPRESSION: 1. Right posterior scalp hematoma without underlying skull fracture. 2. Stable non contrast CT appearance of the brain, no acute intracranial abnormality. Electronically Signed   By: Odessa Fleming M.D.   On: 08/15/2022  07:30    Microbiology: Results for orders placed or performed during the hospital encounter of 08/15/22  Surgical pcr screen     Status: None   Collection Time: 08/16/22  7:31 AM   Specimen: Nasal Mucosa; Nasal Swab  Result Value Ref Range Status   MRSA, PCR NEGATIVE NEGATIVE Final   Staphylococcus aureus NEGATIVE NEGATIVE Final    Comment: (NOTE) The Xpert SA Assay (FDA approved for NASAL specimens in patients 72 years of age and older), is one component of a comprehensive surveillance program. It is not intended to diagnose infection nor to guide or monitor treatment. Performed at [facility], [address] [place], [place] [address]     Labs: CBC: Recent Labs  Lab 08/15/22 [phone] 08/16/22 0521 08/17/22 0320 08/18/22 0655 08/18/22 1303 08/19/22 0334 08/20/22 0457  WBC 10.5 6.7 9.1 10.7*  --  9.8  --   NEUTROABS 8.2*  --   --   --   --   --   --   HGB 10.6* 8.7* 7.8* 6.4* 8.5* 7.9* 7.9*  HCT 31.7* 26.9*  24.0* 19.7* 25.7* 24.3*  --   MCV 102.3* 104.7* 105.7* 103.1*  --  97.2  --   PLT 148* 128* 116* 129*  --  148*  --    Basic Metabolic Panel: Recent Labs  Lab 08/15/22 0836 08/16/22 0521 08/17/22 0320 08/18/22 0655 08/19/22 0334 08/20/22 0457  NA 139 138 138 136 139  --   K 3.8 3.8 3.8 3.6 3.3* 4.0  CL 107 107 108 105 106  --   CO2 23 27 25 24 26   --   GLUCOSE 121* 114* 108* 112* 97  --   BUN 31* 29* 29* 25* 23  --   CREATININE 1.01* 0.89 1.12* 0.94 0.85  --   CALCIUM 9.0 8.6* 8.0* 7.8* 7.9*  --      Discharge time spent: greater than 30 minutes.  Signed: Loletha Grayer, MD [facility] 08/20/2022

## 2022-08-20 NOTE — Progress Notes (Signed)
  Subjective: 4 Days Post-Op Procedure(s) (LRB): INTRAMEDULLARY (IM) NAIL INTERTROCHANTERIC (Right) Patient reports pain as mild.  More alert today.  Feeling better today.  Had a bowel movement earlier and is much better. Patient is well, and has had no acute complaints or problems Plan is to go Rehab after hospital stay. Negative for chest pain and shortness of breath Fever: no Gastrointestinal: Negative for nausea and vomiting  Objective: Vital signs in last 24 hours: Temp:  [98.2 F (36.8 C)-98.5 F (36.9 C)] 98.2 F (36.8 C) (12/21 2332) Pulse Rate:  [82-85] 85 (12/21 2332) Resp:  [16-17] 17 (12/21 2332) BP: (118-135)/(70-83) 135/83 (12/21 2332) SpO2:  [95 %-97 %] 97 % (12/21 2332)  Intake/Output from previous day:  Intake/Output Summary (Last 24 hours) at 08/20/2022 0743 Last data filed at 08/19/2022 1914 Gross per 24 hour  Intake 240 ml  Output --  Net 240 ml    Intake/Output this shift: No intake/output data recorded.  Labs: Recent Labs    08/18/22 0655 08/18/22 1303 08/19/22 0334 08/20/22 0457  HGB 6.4* 8.5* 7.9* 7.9*   Recent Labs    08/18/22 0655 08/18/22 1303 08/19/22 0334  WBC 10.7*  --  9.8  RBC 1.91*  --  2.50*  HCT 19.7* 25.7* 24.3*  PLT 129*  --  148*   Recent Labs    08/18/22 0655 08/19/22 0334 08/20/22 0457  NA 136 139  --   K 3.6 3.3* 4.0  CL 105 106  --   CO2 24 26  --   BUN 25* 23  --   CREATININE 0.94 0.85  --   GLUCOSE 112* 97  --   CALCIUM 7.8* 7.9*  --    No results for input(s): "LABPT", "INR" in the last 72 hours.    EXAM General - Patient is Alert and Oriented Extremity - Sensation intact distally Dorsiflexion/Plantar flexion intact Compartment soft Dressing/Incision - clean, dry, blood tinged drainage Motor Function - intact, moving foot and toes well on exam.   Past Medical History:  Diagnosis Date   Adnexal mass    Anemia    Arthritis    left wrist, bilateral hips   Chronic kidney disease    Stage 3b    Lactic acidosis    Macular degeneration    Orthostatic hypotension    Osteoporosis    Seasonal allergies    Sinus bradycardia    Syncope 02/21/2022   Vertigo    rare    Assessment/Plan: 4 Days Post-Op Procedure(s) (LRB): INTRAMEDULLARY (IM) NAIL INTERTROCHANTERIC (Right) Principal Problem:   Closed right hip fracture (HCC) Active Problems:   Adnexal mass   Protein-calorie malnutrition, moderate (HCC)   Iron deficiency anemia   Depression   Chronic kidney disease (CKD), stage III (moderate) (HCC)   Constipation   Hypokalemia  Estimated body mass index is 18.01 kg/m as calculated from the following:   Height as of this encounter: 5\' 4"  (1.626 m).   Weight as of this encounter: 47.6 kg. Advance diet Up with therapy  Acute blood loss anemia.  Status post hip fracture.  Hemoglobin 7.9 after receiving 1 unit of transfused blood Wednesday.  Discharge planning.  Follow-up at [facility] clinic orthopedics in 2 weeks for x-rays of the right hip and staple removal.  DVT Prophylaxis - Lovenox, Foot Pumps, and TED hose Weight-Bearing as tolerated to right leg  [facility], PA-C Orthopaedic Surgery 08/20/2022, 7:43 AM

## 2022-08-20 NOTE — TOC Transition Note (Signed)
Transition of Care [facility]) - Progression Note    Patient Details  Name: Cassie Solis MRN: [id number] Date of Birth: 22-Feb-1929  Transition [facility] CM/SW Contact  Marlowe Sax, RN Phone Number: 08/20/2022, 9:28 AM  Clinical Narrative:    The patient is to go to room 507 at [facility] I called Orvilla Fus the son and notified him that I called EMS to transport, there are a couple ahead of her on their list    Barriers to Discharge: Insurance Authorization  Expected Discharge Plan and Services       Living arrangements for the past 2 months: Single Family Home Expected Discharge Date: 08/20/22                                     Social Determinants of Health (SDOH) Interventions SDOH Screenings   Food Insecurity: No Food Insecurity (08/15/2022)  Housing: Low Risk  (08/15/2022)  Transportation Needs: No Transportation Needs (08/15/2022)  Utilities: Not At Risk (08/15/2022)  Tobacco Use: Unknown (08/17/2022)    Readmission Risk Interventions    01/08/2022    1:46 PM  Readmission Risk Prevention Plan  Transportation Screening Complete  PCP or Specialist Appt within 5-7 Days Complete  Home Care Screening Complete  Medication Review (RN CM) Complete

## 2022-08-20 NOTE — Care Management Important Message (Signed)
Important Message  Patient Details  Name: Cassie Solis MRN: [id number] Date of Birth: May 08, 1929   Medicare Important Message Given:  Yes     Olegario Messier A Shajuan Musso 08/20/2022, 9:38 AM

## 2022-08-20 NOTE — Plan of Care (Signed)
  Problem: Clinical Measurements: Goal: Ability to maintain clinical measurements within normal limits will improve Outcome: Progressing Goal: Diagnostic test results will improve Outcome: Progressing   Problem: Activity: Goal: Risk for activity intolerance will decrease Outcome: Progressing   Problem: Nutrition: Goal: Adequate nutrition will be maintained Outcome: Progressing   Problem: Pain Managment: Goal: General experience of comfort will improve Outcome: Progressing   

## 2022-08-30 DIAGNOSIS — M810 Age-related osteoporosis without current pathological fracture: Secondary | ICD-10-CM | POA: Diagnosis not present

## 2022-08-30 DIAGNOSIS — M6281 Muscle weakness (generalized): Secondary | ICD-10-CM | POA: Diagnosis not present

## 2022-08-30 DIAGNOSIS — S72141D Displaced intertrochanteric fracture of right femur, subsequent encounter for closed fracture with routine healing: Secondary | ICD-10-CM | POA: Diagnosis not present

## 2022-08-30 DIAGNOSIS — N9489 Other specified conditions associated with female genital organs and menstrual cycle: Secondary | ICD-10-CM | POA: Diagnosis not present

## 2022-08-30 DIAGNOSIS — W19XXXD Unspecified fall, subsequent encounter: Secondary | ICD-10-CM | POA: Diagnosis not present

## 2022-08-30 DIAGNOSIS — E640 Sequelae of protein-calorie malnutrition: Secondary | ICD-10-CM | POA: Diagnosis not present

## 2022-08-30 DIAGNOSIS — Z87890 Personal history of sex reassignment: Secondary | ICD-10-CM | POA: Diagnosis not present

## 2022-08-30 DIAGNOSIS — D500 Iron deficiency anemia secondary to blood loss (chronic): Secondary | ICD-10-CM | POA: Diagnosis not present

## 2022-08-30 DIAGNOSIS — R42 Dizziness and giddiness: Secondary | ICD-10-CM | POA: Diagnosis not present

## 2022-08-30 DIAGNOSIS — F32A Depression, unspecified: Secondary | ICD-10-CM | POA: Diagnosis not present

## 2022-08-30 DIAGNOSIS — E876 Hypokalemia: Secondary | ICD-10-CM | POA: Diagnosis not present

## 2022-08-30 DIAGNOSIS — Z8781 Personal history of (healed) traumatic fracture: Secondary | ICD-10-CM | POA: Diagnosis not present

## 2022-08-30 DIAGNOSIS — K5900 Constipation, unspecified: Secondary | ICD-10-CM | POA: Diagnosis not present

## 2022-08-30 DIAGNOSIS — D509 Iron deficiency anemia, unspecified: Secondary | ICD-10-CM | POA: Diagnosis not present

## 2022-08-30 DIAGNOSIS — R001 Bradycardia, unspecified: Secondary | ICD-10-CM | POA: Diagnosis not present

## 2022-08-30 DIAGNOSIS — E440 Moderate protein-calorie malnutrition: Secondary | ICD-10-CM | POA: Diagnosis not present

## 2022-08-30 DIAGNOSIS — K5901 Slow transit constipation: Secondary | ICD-10-CM | POA: Diagnosis not present

## 2022-08-30 DIAGNOSIS — I951 Orthostatic hypotension: Secondary | ICD-10-CM | POA: Diagnosis not present

## 2022-08-30 DIAGNOSIS — H3530 Unspecified macular degeneration: Secondary | ICD-10-CM | POA: Diagnosis not present

## 2022-08-30 DIAGNOSIS — R54 Age-related physical debility: Secondary | ICD-10-CM | POA: Diagnosis not present

## 2022-08-30 DIAGNOSIS — Z7901 Long term (current) use of anticoagulants: Secondary | ICD-10-CM | POA: Diagnosis not present

## 2022-08-30 DIAGNOSIS — R601 Generalized edema: Secondary | ICD-10-CM | POA: Diagnosis not present

## 2022-08-30 DIAGNOSIS — S72001S Fracture of unspecified part of neck of right femur, sequela: Secondary | ICD-10-CM | POA: Diagnosis not present

## 2022-08-30 DIAGNOSIS — N1832 Chronic kidney disease, stage 3b: Secondary | ICD-10-CM | POA: Diagnosis not present

## 2022-08-30 DIAGNOSIS — N858 Other specified noninflammatory disorders of uterus: Secondary | ICD-10-CM | POA: Diagnosis not present

## 2022-08-30 DIAGNOSIS — H548 Legal blindness, as defined in USA: Secondary | ICD-10-CM | POA: Diagnosis not present

## 2022-08-30 DIAGNOSIS — N183 Chronic kidney disease, stage 3 unspecified: Secondary | ICD-10-CM | POA: Diagnosis not present

## 2022-08-31 DIAGNOSIS — N858 Other specified noninflammatory disorders of uterus: Secondary | ICD-10-CM | POA: Diagnosis not present

## 2022-08-31 DIAGNOSIS — M6281 Muscle weakness (generalized): Secondary | ICD-10-CM | POA: Diagnosis not present

## 2022-08-31 DIAGNOSIS — S72001S Fracture of unspecified part of neck of right femur, sequela: Secondary | ICD-10-CM | POA: Diagnosis not present

## 2022-08-31 DIAGNOSIS — N183 Chronic kidney disease, stage 3 unspecified: Secondary | ICD-10-CM | POA: Diagnosis not present

## 2022-08-31 DIAGNOSIS — E876 Hypokalemia: Secondary | ICD-10-CM | POA: Diagnosis not present

## 2022-08-31 DIAGNOSIS — D500 Iron deficiency anemia secondary to blood loss (chronic): Secondary | ICD-10-CM | POA: Diagnosis not present

## 2022-08-31 DIAGNOSIS — K5901 Slow transit constipation: Secondary | ICD-10-CM | POA: Diagnosis not present

## 2022-08-31 DIAGNOSIS — E640 Sequelae of protein-calorie malnutrition: Secondary | ICD-10-CM | POA: Diagnosis not present

## 2022-09-03 DIAGNOSIS — N858 Other specified noninflammatory disorders of uterus: Secondary | ICD-10-CM | POA: Diagnosis not present

## 2022-09-03 DIAGNOSIS — N183 Chronic kidney disease, stage 3 unspecified: Secondary | ICD-10-CM | POA: Diagnosis not present

## 2022-09-03 DIAGNOSIS — R54 Age-related physical debility: Secondary | ICD-10-CM | POA: Diagnosis not present

## 2022-09-03 DIAGNOSIS — M6281 Muscle weakness (generalized): Secondary | ICD-10-CM | POA: Diagnosis not present

## 2022-09-03 DIAGNOSIS — K5901 Slow transit constipation: Secondary | ICD-10-CM | POA: Diagnosis not present

## 2022-09-03 DIAGNOSIS — E876 Hypokalemia: Secondary | ICD-10-CM | POA: Diagnosis not present

## 2022-09-03 DIAGNOSIS — E640 Sequelae of protein-calorie malnutrition: Secondary | ICD-10-CM | POA: Diagnosis not present

## 2022-09-03 DIAGNOSIS — S72001S Fracture of unspecified part of neck of right femur, sequela: Secondary | ICD-10-CM | POA: Diagnosis not present

## 2022-09-03 DIAGNOSIS — D500 Iron deficiency anemia secondary to blood loss (chronic): Secondary | ICD-10-CM | POA: Diagnosis not present

## 2022-09-06 DIAGNOSIS — S72001S Fracture of unspecified part of neck of right femur, sequela: Secondary | ICD-10-CM | POA: Diagnosis not present

## 2022-09-06 DIAGNOSIS — N183 Chronic kidney disease, stage 3 unspecified: Secondary | ICD-10-CM | POA: Diagnosis not present

## 2022-09-06 DIAGNOSIS — M6281 Muscle weakness (generalized): Secondary | ICD-10-CM | POA: Diagnosis not present

## 2022-09-06 DIAGNOSIS — N858 Other specified noninflammatory disorders of uterus: Secondary | ICD-10-CM | POA: Diagnosis not present

## 2022-09-06 DIAGNOSIS — E640 Sequelae of protein-calorie malnutrition: Secondary | ICD-10-CM | POA: Diagnosis not present

## 2022-09-06 DIAGNOSIS — D500 Iron deficiency anemia secondary to blood loss (chronic): Secondary | ICD-10-CM | POA: Diagnosis not present

## 2022-09-06 DIAGNOSIS — E876 Hypokalemia: Secondary | ICD-10-CM | POA: Diagnosis not present

## 2022-09-06 DIAGNOSIS — Z87890 Personal history of sex reassignment: Secondary | ICD-10-CM | POA: Diagnosis not present

## 2022-09-06 DIAGNOSIS — K5901 Slow transit constipation: Secondary | ICD-10-CM | POA: Diagnosis not present

## 2022-09-06 DIAGNOSIS — Z8781 Personal history of (healed) traumatic fracture: Secondary | ICD-10-CM | POA: Diagnosis not present

## 2022-09-06 DIAGNOSIS — R54 Age-related physical debility: Secondary | ICD-10-CM | POA: Diagnosis not present

## 2022-09-07 DIAGNOSIS — I951 Orthostatic hypotension: Secondary | ICD-10-CM | POA: Diagnosis not present

## 2022-09-07 DIAGNOSIS — S72141D Displaced intertrochanteric fracture of right femur, subsequent encounter for closed fracture with routine healing: Secondary | ICD-10-CM | POA: Diagnosis not present

## 2022-09-07 DIAGNOSIS — E440 Moderate protein-calorie malnutrition: Secondary | ICD-10-CM | POA: Diagnosis not present

## 2022-09-07 DIAGNOSIS — D509 Iron deficiency anemia, unspecified: Secondary | ICD-10-CM | POA: Diagnosis not present

## 2022-09-07 DIAGNOSIS — F32A Depression, unspecified: Secondary | ICD-10-CM | POA: Diagnosis not present

## 2022-09-07 DIAGNOSIS — R001 Bradycardia, unspecified: Secondary | ICD-10-CM | POA: Diagnosis not present

## 2022-09-07 DIAGNOSIS — K5900 Constipation, unspecified: Secondary | ICD-10-CM | POA: Diagnosis not present

## 2022-09-07 DIAGNOSIS — H548 Legal blindness, as defined in USA: Secondary | ICD-10-CM | POA: Diagnosis not present

## 2022-09-07 DIAGNOSIS — W19XXXD Unspecified fall, subsequent encounter: Secondary | ICD-10-CM | POA: Diagnosis not present

## 2022-09-07 DIAGNOSIS — N1832 Chronic kidney disease, stage 3b: Secondary | ICD-10-CM | POA: Diagnosis not present

## 2022-09-10 DIAGNOSIS — D631 Anemia in chronic kidney disease: Secondary | ICD-10-CM | POA: Diagnosis not present

## 2022-09-10 DIAGNOSIS — N189 Chronic kidney disease, unspecified: Secondary | ICD-10-CM | POA: Diagnosis not present

## 2022-09-30 DIAGNOSIS — E440 Moderate protein-calorie malnutrition: Secondary | ICD-10-CM | POA: Diagnosis not present

## 2022-09-30 DIAGNOSIS — R001 Bradycardia, unspecified: Secondary | ICD-10-CM | POA: Diagnosis not present

## 2022-09-30 DIAGNOSIS — D509 Iron deficiency anemia, unspecified: Secondary | ICD-10-CM | POA: Diagnosis not present

## 2022-09-30 DIAGNOSIS — N1832 Chronic kidney disease, stage 3b: Secondary | ICD-10-CM | POA: Diagnosis not present

## 2022-09-30 DIAGNOSIS — W19XXXD Unspecified fall, subsequent encounter: Secondary | ICD-10-CM | POA: Diagnosis not present

## 2022-09-30 DIAGNOSIS — S72141D Displaced intertrochanteric fracture of right femur, subsequent encounter for closed fracture with routine healing: Secondary | ICD-10-CM | POA: Diagnosis not present

## 2022-09-30 DIAGNOSIS — K5900 Constipation, unspecified: Secondary | ICD-10-CM | POA: Diagnosis not present

## 2022-09-30 DIAGNOSIS — F32A Depression, unspecified: Secondary | ICD-10-CM | POA: Diagnosis not present

## 2022-09-30 DIAGNOSIS — I951 Orthostatic hypotension: Secondary | ICD-10-CM | POA: Diagnosis not present

## 2022-09-30 DIAGNOSIS — H548 Legal blindness, as defined in USA: Secondary | ICD-10-CM | POA: Diagnosis not present

## 2022-10-04 DIAGNOSIS — Z9889 Other specified postprocedural states: Secondary | ICD-10-CM | POA: Diagnosis not present

## 2022-11-01 DIAGNOSIS — E440 Moderate protein-calorie malnutrition: Secondary | ICD-10-CM | POA: Diagnosis not present

## 2022-11-01 DIAGNOSIS — I951 Orthostatic hypotension: Secondary | ICD-10-CM | POA: Diagnosis not present

## 2022-11-01 DIAGNOSIS — H548 Legal blindness, as defined in USA: Secondary | ICD-10-CM | POA: Diagnosis not present

## 2022-11-01 DIAGNOSIS — D509 Iron deficiency anemia, unspecified: Secondary | ICD-10-CM | POA: Diagnosis not present

## 2022-11-01 DIAGNOSIS — S72141D Displaced intertrochanteric fracture of right femur, subsequent encounter for closed fracture with routine healing: Secondary | ICD-10-CM | POA: Diagnosis not present

## 2022-11-01 DIAGNOSIS — M810 Age-related osteoporosis without current pathological fracture: Secondary | ICD-10-CM | POA: Diagnosis not present

## 2022-11-01 DIAGNOSIS — N1832 Chronic kidney disease, stage 3b: Secondary | ICD-10-CM | POA: Diagnosis not present

## 2022-11-01 DIAGNOSIS — K5900 Constipation, unspecified: Secondary | ICD-10-CM | POA: Diagnosis not present

## 2022-11-01 DIAGNOSIS — F32A Depression, unspecified: Secondary | ICD-10-CM | POA: Diagnosis not present

## 2022-11-01 DIAGNOSIS — R001 Bradycardia, unspecified: Secondary | ICD-10-CM | POA: Diagnosis not present

## 2022-11-05 DIAGNOSIS — Z139 Encounter for screening, unspecified: Secondary | ICD-10-CM | POA: Diagnosis not present

## 2022-11-05 DIAGNOSIS — H6990 Unspecified Eustachian tube disorder, unspecified ear: Secondary | ICD-10-CM | POA: Diagnosis not present

## 2022-11-05 DIAGNOSIS — R6889 Other general symptoms and signs: Secondary | ICD-10-CM | POA: Diagnosis not present

## 2022-11-16 DIAGNOSIS — Z961 Presence of intraocular lens: Secondary | ICD-10-CM | POA: Diagnosis not present

## 2022-11-16 DIAGNOSIS — H43813 Vitreous degeneration, bilateral: Secondary | ICD-10-CM | POA: Diagnosis not present

## 2022-11-16 DIAGNOSIS — H353134 Nonexudative age-related macular degeneration, bilateral, advanced atrophic with subfoveal involvement: Secondary | ICD-10-CM | POA: Diagnosis not present

## 2022-11-29 DIAGNOSIS — R001 Bradycardia, unspecified: Secondary | ICD-10-CM | POA: Diagnosis not present

## 2022-11-29 DIAGNOSIS — D509 Iron deficiency anemia, unspecified: Secondary | ICD-10-CM | POA: Diagnosis not present

## 2022-11-29 DIAGNOSIS — E440 Moderate protein-calorie malnutrition: Secondary | ICD-10-CM | POA: Diagnosis not present

## 2022-11-29 DIAGNOSIS — M810 Age-related osteoporosis without current pathological fracture: Secondary | ICD-10-CM | POA: Diagnosis not present

## 2022-11-29 DIAGNOSIS — I951 Orthostatic hypotension: Secondary | ICD-10-CM | POA: Diagnosis not present

## 2022-11-29 DIAGNOSIS — N1832 Chronic kidney disease, stage 3b: Secondary | ICD-10-CM | POA: Diagnosis not present

## 2022-11-29 DIAGNOSIS — S72141D Displaced intertrochanteric fracture of right femur, subsequent encounter for closed fracture with routine healing: Secondary | ICD-10-CM | POA: Diagnosis not present

## 2022-11-29 DIAGNOSIS — K5900 Constipation, unspecified: Secondary | ICD-10-CM | POA: Diagnosis not present

## 2022-11-29 DIAGNOSIS — H548 Legal blindness, as defined in USA: Secondary | ICD-10-CM | POA: Diagnosis not present

## 2022-11-29 DIAGNOSIS — F32A Depression, unspecified: Secondary | ICD-10-CM | POA: Diagnosis not present

## 2022-12-06 DIAGNOSIS — Z8781 Personal history of (healed) traumatic fracture: Secondary | ICD-10-CM | POA: Diagnosis not present

## 2022-12-06 DIAGNOSIS — S72001D Fracture of unspecified part of neck of right femur, subsequent encounter for closed fracture with routine healing: Secondary | ICD-10-CM | POA: Diagnosis not present

## 2022-12-06 DIAGNOSIS — Z9889 Other specified postprocedural states: Secondary | ICD-10-CM | POA: Diagnosis not present

## 2022-12-06 DIAGNOSIS — M80062D Age-related osteoporosis with current pathological fracture, left lower leg, subsequent encounter for fracture with routine healing: Secondary | ICD-10-CM | POA: Diagnosis not present

## 2023-03-14 DIAGNOSIS — R630 Anorexia: Secondary | ICD-10-CM | POA: Diagnosis not present

## 2023-03-14 DIAGNOSIS — Z9181 History of falling: Secondary | ICD-10-CM | POA: Diagnosis not present

## 2023-03-14 DIAGNOSIS — D631 Anemia in chronic kidney disease: Secondary | ICD-10-CM | POA: Diagnosis not present

## 2023-03-14 DIAGNOSIS — R32 Unspecified urinary incontinence: Secondary | ICD-10-CM | POA: Diagnosis not present

## 2023-03-14 DIAGNOSIS — N189 Chronic kidney disease, unspecified: Secondary | ICD-10-CM | POA: Diagnosis not present

## 2023-03-19 IMAGING — RF DG HIP (WITH OR WITHOUT PELVIS) 2-3V*L*
1 series · 3 of 3 positions shown · non-contrast
Comparison: Pelvis and left hip radiographs 01/06/2022

CLINICAL DATA: Operative fluoroscopy for intramedullary nail left
femur fracture

EXAM:
DG HIP (WITH OR WITHOUT PELVIS) 2-3V LEFT

[Series 1: run · 3 of 3 slices shown]
[im 1/3]
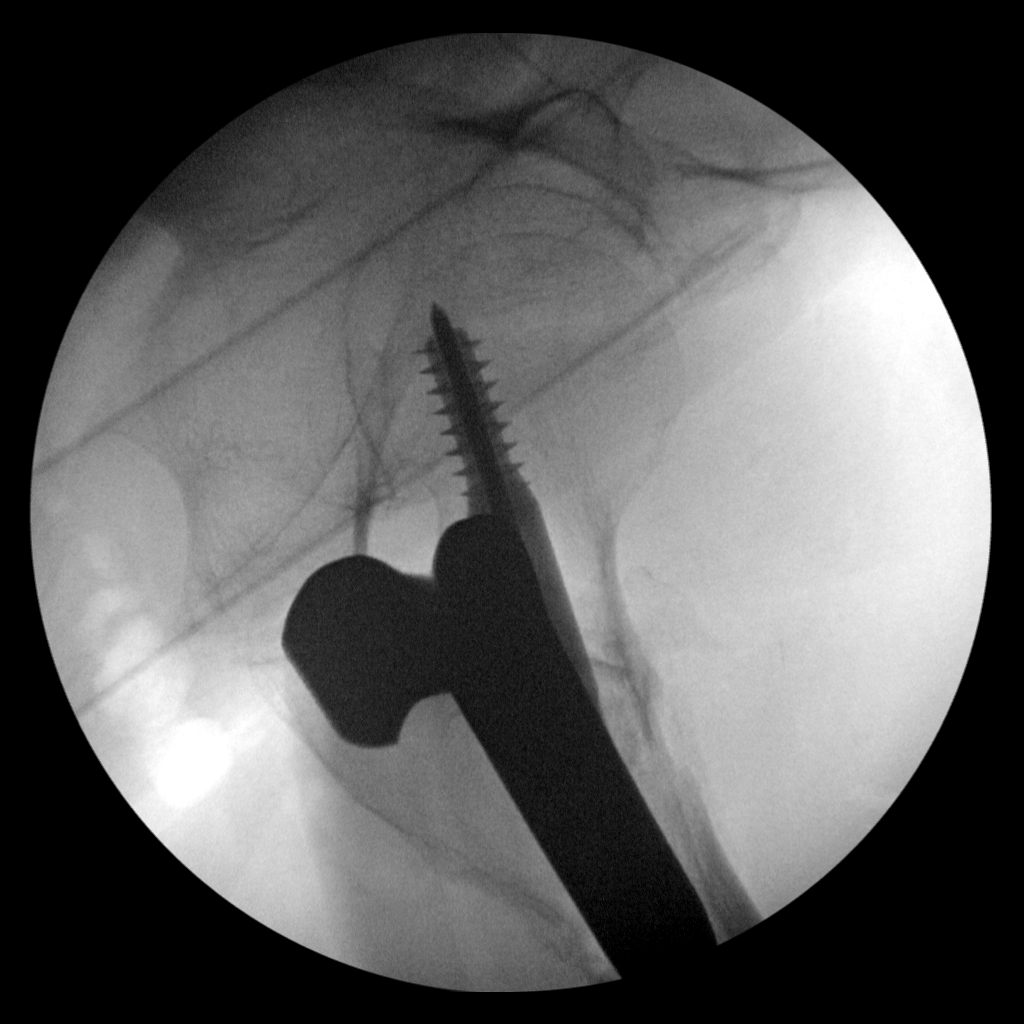
[im 2/3]
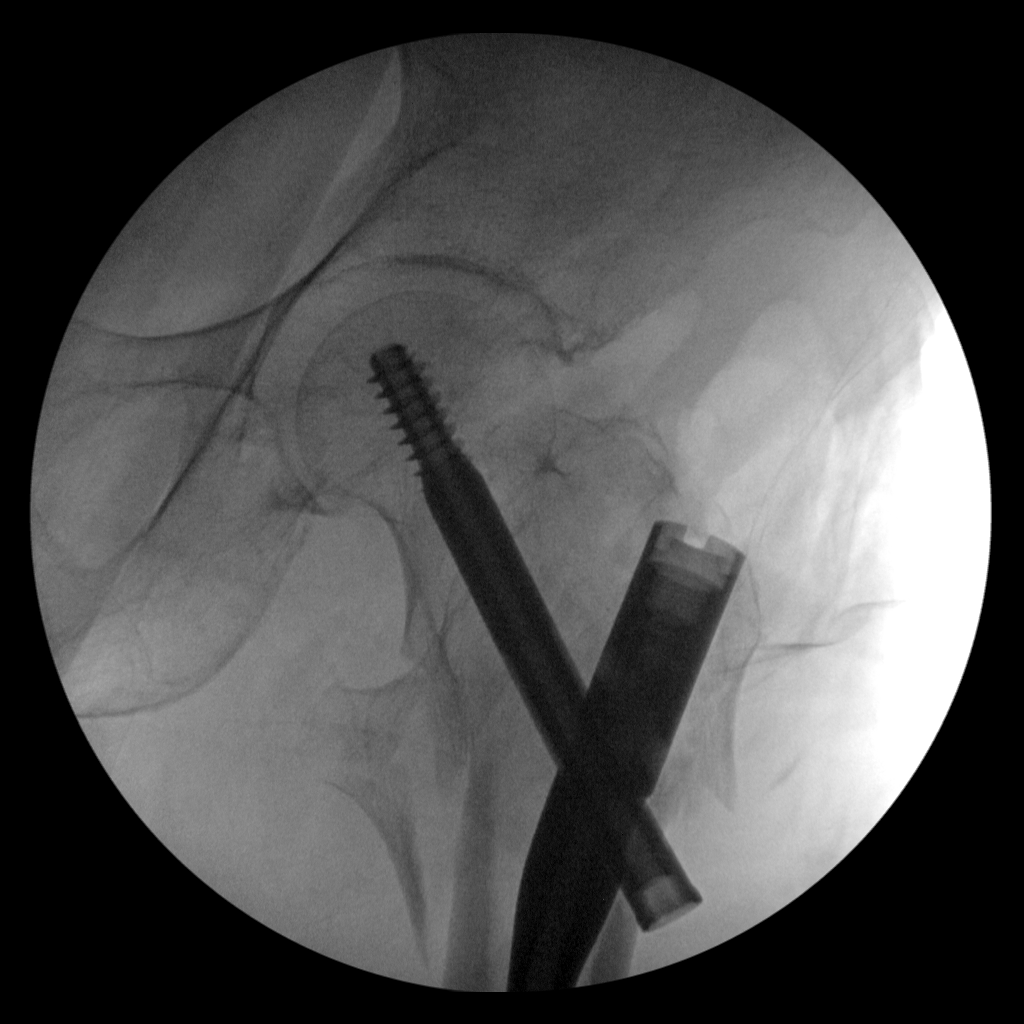
[im 3/3]
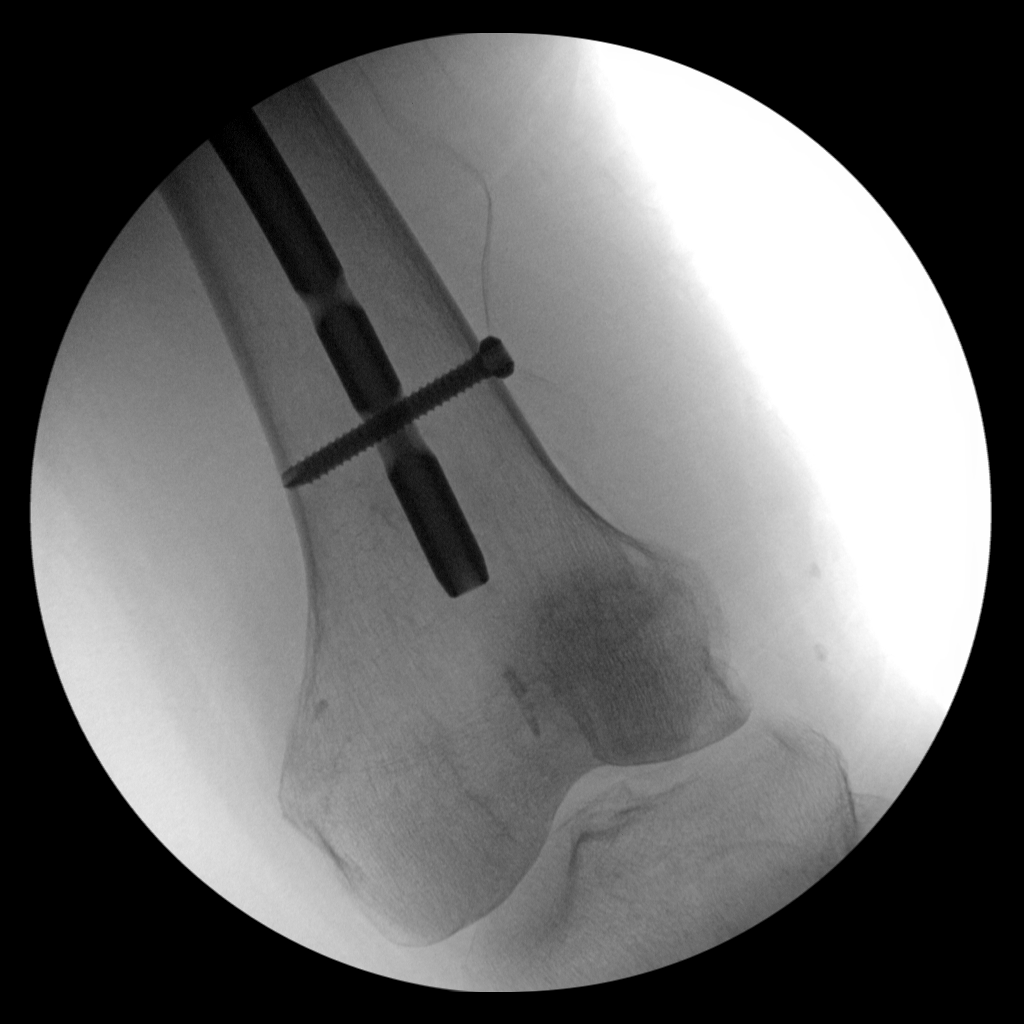

[3 of 3 positions shown; findings below may reference images not displayed]

FINDINGS: Images were performed intraoperatively without the presence of a
radiologist. The patient is undergoing long intramedullary nail
fixation of the left femur including the previously seen left
intertrochanteric fracture.

Total fluoroscopy images: 3

Total fluoroscopy time: 86 seconds

Please see intraoperative findings for further detail.
IMPRESSION: Intraoperative fluoroscopy for ORIF of proximal left femoral
fracture.

## 2023-03-22 IMAGING — CT CT ABD-PELV W/O CM
2 of 4 series · 13 of 46 positions shown, 15 images · non-contrast
Comparison: None Available.
COMPARISON: None Available.

Addendum:
CLINICAL DATA: Dropping hemoglobin.  Status post fall.



[Series 2: ap without · axial · non-contrast · 0.79mm/px · z∈[-1046,-696]mm · 10 of 78 slices shown, 12 images]
[im 4/78  soft-tissue]
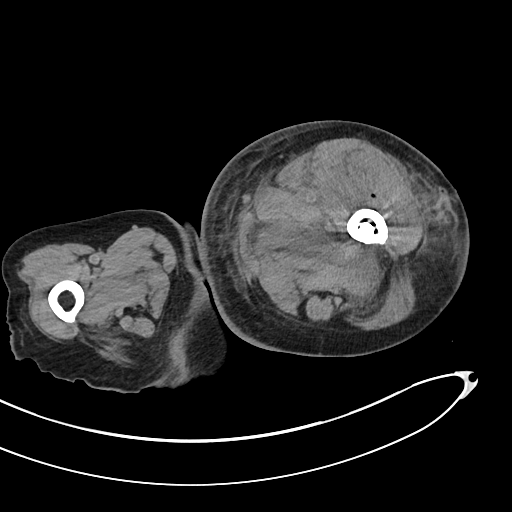
[im 4/78  bone]
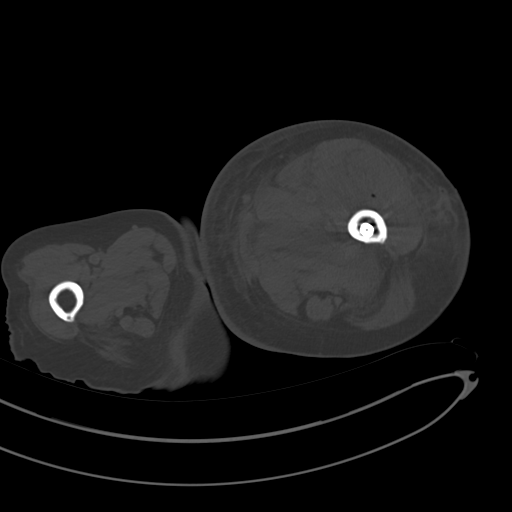
[im 12/78  soft-tissue]
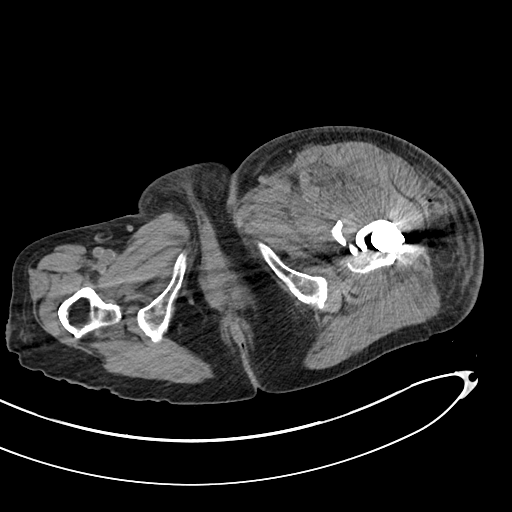
[im 20/78  soft-tissue]
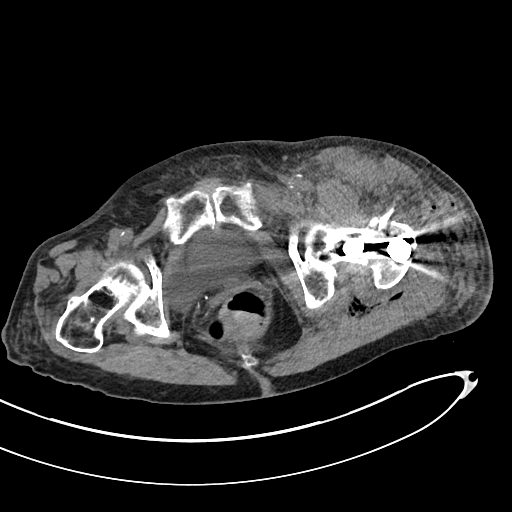
[im 27/78  soft-tissue]
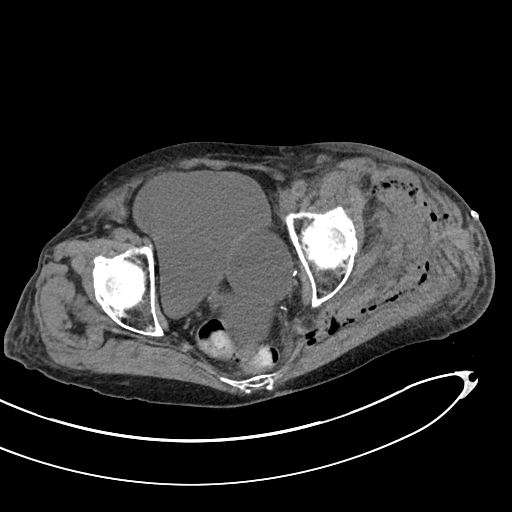
[im 35/78  soft-tissue]
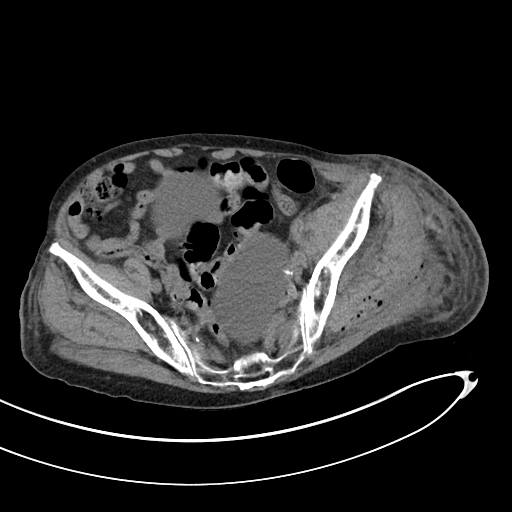
[im 43/78  soft-tissue]
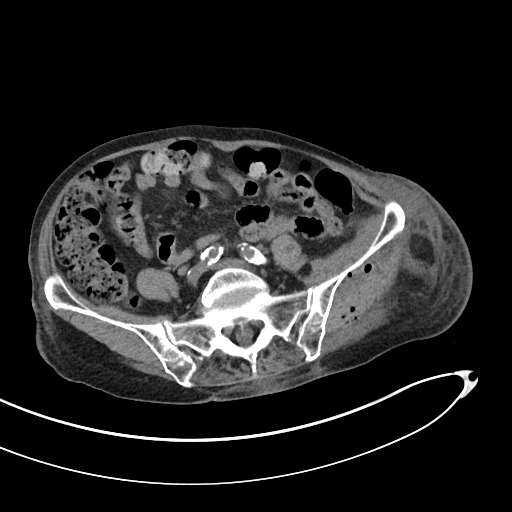
[im 51/78  soft-tissue]
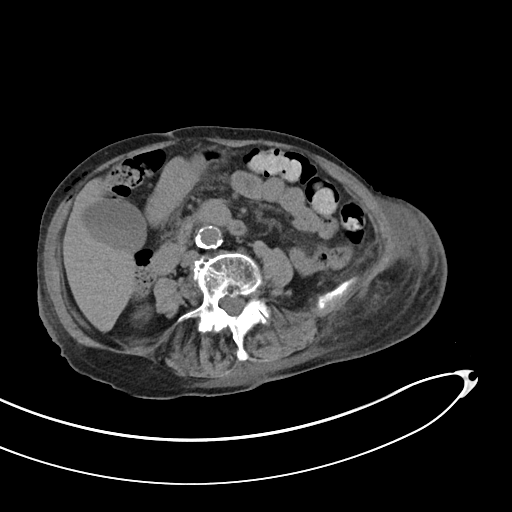
[im 58/78  soft-tissue]
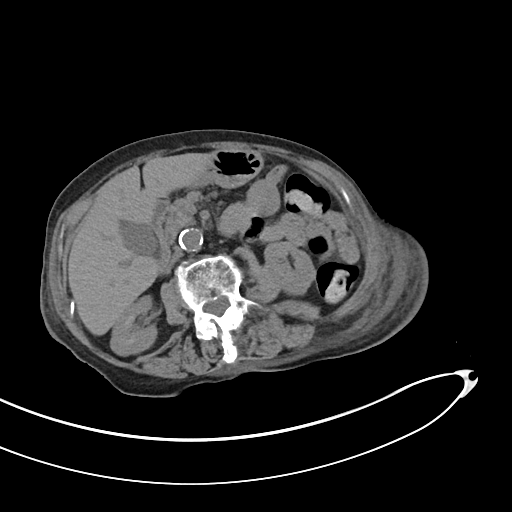
[im 66/78  soft-tissue]
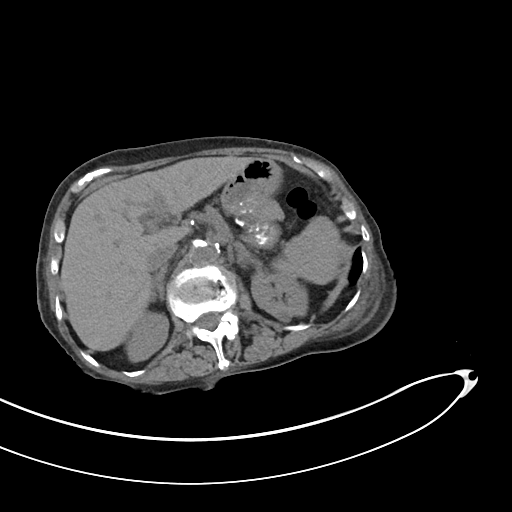
[im 66/78  bone]
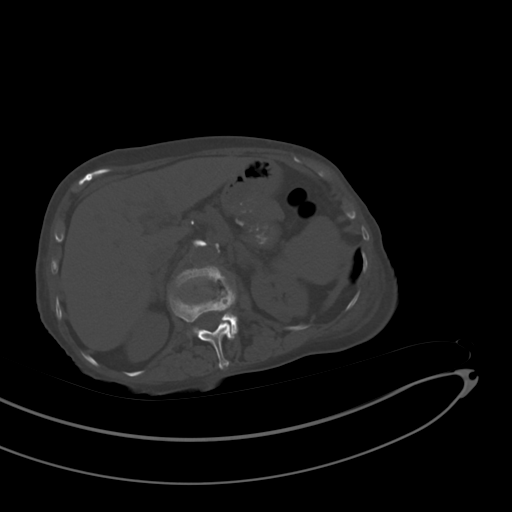
[im 74/78  soft-tissue]
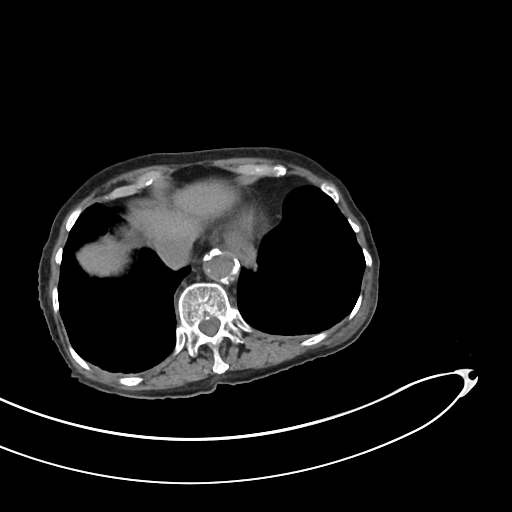

[Series 5: (person_name) · coronal · 0.78mm/px · 3 of 77 slices shown]
[im 26/77  soft-tissue]
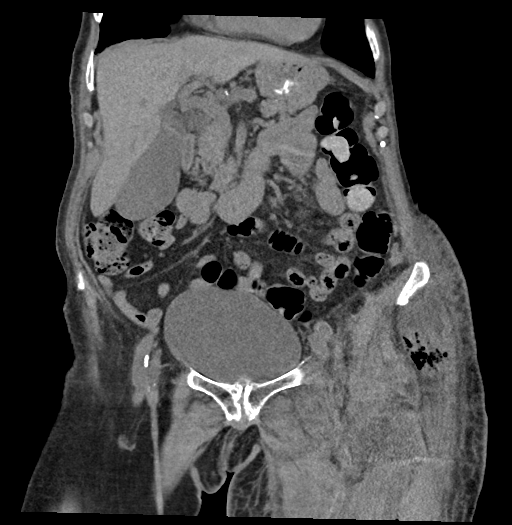
[im 34/77  soft-tissue]
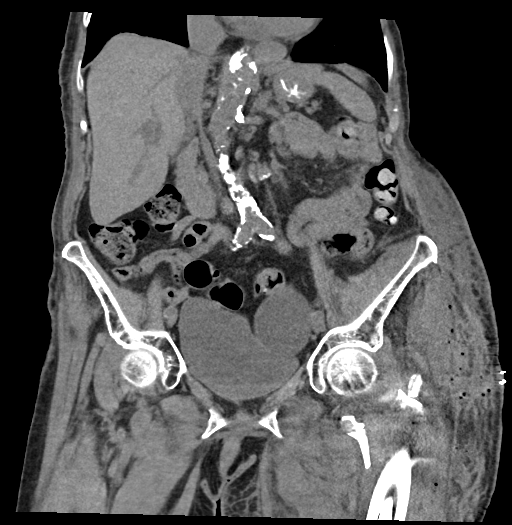
[im 43/77  soft-tissue]
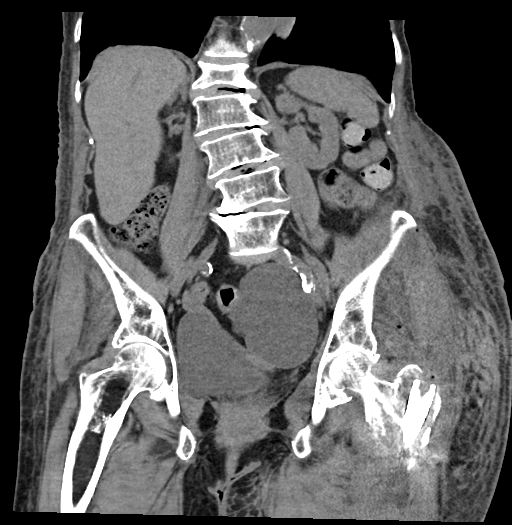

[13 of 46 positions shown; findings below may reference images not displayed]

FINDINGS: Lower chest: Unremarkable.

Hepatobiliary: No suspicious focal abnormality in the liver on this
study without intravenous contrast. Gallbladder is distended. No
intrahepatic or extrahepatic biliary dilation.

Pancreas: No focal mass lesion. No dilatation of the main duct. No
intraparenchymal cyst. No peripancreatic edema.

Spleen: No splenomegaly. No focal mass lesion.

Adrenals/Urinary Tract: No adrenal nodule or mass. Kidneys
unremarkable. No evidence for hydroureter. The urinary bladder
appears normal for the degree of distention.

Stomach/Bowel: Stomach is unremarkable. No gastric wall thickening.
No evidence of outlet obstruction. Duodenum is normally positioned
as is the ligament of Treitz. No small bowel wall thickening. No
small bowel dilatation. The terminal ileum is normal. The appendix
is normal. No gross colonic mass. No colonic wall thickening.

Vascular/Lymphatic: There is moderate atherosclerotic calcification
of the abdominal aorta without aneurysm. There is no gastrohepatic
or hepatoduodenal ligament lymphadenopathy. No retroperitoneal or
mesenteric lymphadenopathy. No pelvic sidewall lymphadenopathy.

Reproductive: Uterus surgically absent. 10.4 x 5.8 x 8.0 cm
multicystic lesion is identified in the left adnexal space.

Other: No intraperitoneal free fluid. No retroperitoneal hemorrhage.

Musculoskeletal: Extensive edema/hemorrhage identified in the soft
tissues around the left hip. Gas in the soft tissues of the left hip
region and left buttocks is compatible with recent surgery for ORIF
of proximal left femur fracture. No discrete soft tissue hematoma
evident. Subcutaneous edema noted in the pelvis, left greater than
right, and left thigh.
IMPRESSION: 1. Extensive edema/hemorrhage in the soft tissues around the left
hip region and left buttocks. Gas in the soft tissues of the left
hip region and left buttocks is compatible with recent surgery for
ORIF of proximal left femur fracture. No discrete soft tissue
hematoma evident. No retroperitoneal hematoma to explain the
patient's history of decreasing hemoglobin
2. 10.4 x 5.8 x 8.0 cm multicystic lesion in the left adnexal space.
Gynecology consult recommended and consider MR with IV contrast if
clinically warranted. Reference: JACR [DATE]):[id number]
3. Subcutaneous edema in the pelvis, left greater than right, and
left thigh.
4. Aortic Atherosclerosis ([id number]).

ADDENDUM:
Gas is present in the lumen of the urinary bladder. This suggests
recent instrumentation. In the absence of recent bladder
instrumentation, infection should be considered.

*** End of Addendum ***
FINDINGS: Lower chest: Unremarkable.

Hepatobiliary: No suspicious focal abnormality in the liver on this
study without intravenous contrast. Gallbladder is distended. No
intrahepatic or extrahepatic biliary dilation.

Pancreas: No focal mass lesion. No dilatation of the main duct. No
intraparenchymal cyst. No peripancreatic edema.

Spleen: No splenomegaly. No focal mass lesion.

Adrenals/Urinary Tract: No adrenal nodule or mass. Kidneys
unremarkable. No evidence for hydroureter. The urinary bladder
appears normal for the degree of distention.

Stomach/Bowel: Stomach is unremarkable. No gastric wall thickening.
No evidence of outlet obstruction. Duodenum is normally positioned
as is the ligament of Treitz. No small bowel wall thickening. No
small bowel dilatation. The terminal ileum is normal. The appendix
is normal. No gross colonic mass. No colonic wall thickening.

Vascular/Lymphatic: There is moderate atherosclerotic calcification
of the abdominal aorta without aneurysm. There is no gastrohepatic
or hepatoduodenal ligament lymphadenopathy. No retroperitoneal or
mesenteric lymphadenopathy. No pelvic sidewall lymphadenopathy.

Reproductive: Uterus surgically absent. 10.4 x 5.8 x 8.0 cm
multicystic lesion is identified in the left adnexal space.

Other: No intraperitoneal free fluid. No retroperitoneal hemorrhage.

Musculoskeletal: Extensive edema/hemorrhage identified in the soft
tissues around the left hip. Gas in the soft tissues of the left hip
region and left buttocks is compatible with recent surgery for ORIF
of proximal left femur fracture. No discrete soft tissue hematoma
evident. Subcutaneous edema noted in the pelvis, left greater than
right, and left thigh.
IMPRESSION: 1. Extensive edema/hemorrhage in the soft tissues around the left
hip region and left buttocks. Gas in the soft tissues of the left
hip region and left buttocks is compatible with recent surgery for
ORIF of proximal left femur fracture. No discrete soft tissue
hematoma evident. No retroperitoneal hematoma to explain the
patient's history of decreasing hemoglobin
2. 10.4 x 5.8 x 8.0 cm multicystic lesion in the left adnexal space.
Gynecology consult recommended and consider MR with IV contrast if
clinically warranted. Reference: JACR [DATE]):[id number]
3. Subcutaneous edema in the pelvis, left greater than right, and
left thigh.
4. Aortic Atherosclerosis ([id number]).

## 2023-03-24 DIAGNOSIS — M1990 Unspecified osteoarthritis, unspecified site: Secondary | ICD-10-CM | POA: Diagnosis not present

## 2023-03-24 DIAGNOSIS — I129 Hypertensive chronic kidney disease with stage 1 through stage 4 chronic kidney disease, or unspecified chronic kidney disease: Secondary | ICD-10-CM | POA: Diagnosis not present

## 2023-03-24 DIAGNOSIS — K5900 Constipation, unspecified: Secondary | ICD-10-CM | POA: Diagnosis not present

## 2023-03-24 DIAGNOSIS — D509 Iron deficiency anemia, unspecified: Secondary | ICD-10-CM | POA: Diagnosis not present

## 2023-03-24 DIAGNOSIS — F0393 Unspecified dementia, unspecified severity, with mood disturbance: Secondary | ICD-10-CM | POA: Diagnosis not present

## 2023-03-24 DIAGNOSIS — G8929 Other chronic pain: Secondary | ICD-10-CM | POA: Diagnosis not present

## 2023-03-24 DIAGNOSIS — M810 Age-related osteoporosis without current pathological fracture: Secondary | ICD-10-CM | POA: Diagnosis not present

## 2023-03-24 DIAGNOSIS — G4700 Insomnia, unspecified: Secondary | ICD-10-CM | POA: Diagnosis not present

## 2023-03-24 DIAGNOSIS — H3530 Unspecified macular degeneration: Secondary | ICD-10-CM | POA: Diagnosis not present

## 2023-03-24 DIAGNOSIS — J309 Allergic rhinitis, unspecified: Secondary | ICD-10-CM | POA: Diagnosis not present

## 2023-03-24 DIAGNOSIS — Z9849 Cataract extraction status, unspecified eye: Secondary | ICD-10-CM | POA: Diagnosis not present

## 2023-03-24 DIAGNOSIS — N1831 Chronic kidney disease, stage 3a: Secondary | ICD-10-CM | POA: Diagnosis not present

## 2023-05-23 DIAGNOSIS — Z961 Presence of intraocular lens: Secondary | ICD-10-CM | POA: Diagnosis not present

## 2023-05-23 DIAGNOSIS — H43813 Vitreous degeneration, bilateral: Secondary | ICD-10-CM | POA: Diagnosis not present

## 2023-05-23 DIAGNOSIS — H353134 Nonexudative age-related macular degeneration, bilateral, advanced atrophic with subfoveal involvement: Secondary | ICD-10-CM | POA: Diagnosis not present

## 2023-06-21 DIAGNOSIS — Z1331 Encounter for screening for depression: Secondary | ICD-10-CM | POA: Diagnosis not present

## 2023-06-21 DIAGNOSIS — Z139 Encounter for screening, unspecified: Secondary | ICD-10-CM | POA: Diagnosis not present

## 2023-06-21 DIAGNOSIS — Z9181 History of falling: Secondary | ICD-10-CM | POA: Diagnosis not present

## 2023-06-21 DIAGNOSIS — N1832 Chronic kidney disease, stage 3b: Secondary | ICD-10-CM | POA: Diagnosis not present

## 2023-06-21 DIAGNOSIS — Z0000 Encounter for general adult medical examination without abnormal findings: Secondary | ICD-10-CM | POA: Diagnosis not present

## 2023-09-12 DIAGNOSIS — D631 Anemia in chronic kidney disease: Secondary | ICD-10-CM | POA: Diagnosis not present

## 2023-09-12 DIAGNOSIS — R55 Syncope and collapse: Secondary | ICD-10-CM | POA: Diagnosis not present

## 2023-09-12 DIAGNOSIS — R32 Unspecified urinary incontinence: Secondary | ICD-10-CM | POA: Diagnosis not present

## 2023-09-12 DIAGNOSIS — N189 Chronic kidney disease, unspecified: Secondary | ICD-10-CM | POA: Diagnosis not present

## 2023-10-17 DIAGNOSIS — N1832 Chronic kidney disease, stage 3b: Secondary | ICD-10-CM | POA: Diagnosis not present

## 2023-11-15 DIAGNOSIS — Z961 Presence of intraocular lens: Secondary | ICD-10-CM | POA: Diagnosis not present

## 2023-11-15 DIAGNOSIS — H353134 Nonexudative age-related macular degeneration, bilateral, advanced atrophic with subfoveal involvement: Secondary | ICD-10-CM | POA: Diagnosis not present

## 2024-04-16 DIAGNOSIS — R32 Unspecified urinary incontinence: Secondary | ICD-10-CM | POA: Diagnosis not present

## 2024-04-16 DIAGNOSIS — R630 Anorexia: Secondary | ICD-10-CM | POA: Diagnosis not present

## 2024-04-16 DIAGNOSIS — M810 Age-related osteoporosis without current pathological fracture: Secondary | ICD-10-CM | POA: Diagnosis not present

## 2024-04-16 DIAGNOSIS — M47812 Spondylosis without myelopathy or radiculopathy, cervical region: Secondary | ICD-10-CM | POA: Diagnosis not present

## 2024-04-16 DIAGNOSIS — N1832 Chronic kidney disease, stage 3b: Secondary | ICD-10-CM | POA: Diagnosis not present

## 2024-04-16 DIAGNOSIS — N189 Chronic kidney disease, unspecified: Secondary | ICD-10-CM | POA: Diagnosis not present

## 2024-04-16 DIAGNOSIS — H3530 Unspecified macular degeneration: Secondary | ICD-10-CM | POA: Diagnosis not present

## 2024-04-16 DIAGNOSIS — D631 Anemia in chronic kidney disease: Secondary | ICD-10-CM | POA: Diagnosis not present

## 2024-05-10 DIAGNOSIS — D631 Anemia in chronic kidney disease: Secondary | ICD-10-CM | POA: Diagnosis not present

## 2024-05-10 DIAGNOSIS — M810 Age-related osteoporosis without current pathological fracture: Secondary | ICD-10-CM | POA: Diagnosis not present

## 2024-05-10 DIAGNOSIS — M6281 Muscle weakness (generalized): Secondary | ICD-10-CM | POA: Diagnosis not present

## 2024-05-10 DIAGNOSIS — N189 Chronic kidney disease, unspecified: Secondary | ICD-10-CM | POA: Diagnosis not present

## 2024-05-10 DIAGNOSIS — M6259 Muscle wasting and atrophy, not elsewhere classified, multiple sites: Secondary | ICD-10-CM | POA: Diagnosis not present

## 2024-05-10 DIAGNOSIS — H3530 Unspecified macular degeneration: Secondary | ICD-10-CM | POA: Diagnosis not present

## 2024-05-14 DIAGNOSIS — K5900 Constipation, unspecified: Secondary | ICD-10-CM | POA: Diagnosis not present

## 2024-05-14 DIAGNOSIS — M810 Age-related osteoporosis without current pathological fracture: Secondary | ICD-10-CM | POA: Diagnosis not present

## 2024-05-14 DIAGNOSIS — D631 Anemia in chronic kidney disease: Secondary | ICD-10-CM | POA: Diagnosis not present

## 2024-05-14 DIAGNOSIS — H3530 Unspecified macular degeneration: Secondary | ICD-10-CM | POA: Diagnosis not present

## 2024-05-24 DIAGNOSIS — K5900 Constipation, unspecified: Secondary | ICD-10-CM | POA: Diagnosis not present

## 2024-06-08 DIAGNOSIS — H3530 Unspecified macular degeneration: Secondary | ICD-10-CM | POA: Diagnosis not present

## 2024-06-08 DIAGNOSIS — K5900 Constipation, unspecified: Secondary | ICD-10-CM | POA: Diagnosis not present

## 2024-06-08 DIAGNOSIS — R630 Anorexia: Secondary | ICD-10-CM | POA: Diagnosis not present

## 2024-06-08 DIAGNOSIS — N189 Chronic kidney disease, unspecified: Secondary | ICD-10-CM | POA: Diagnosis not present

## 2024-06-08 DIAGNOSIS — M810 Age-related osteoporosis without current pathological fracture: Secondary | ICD-10-CM | POA: Diagnosis not present

## 2024-07-06 DIAGNOSIS — R630 Anorexia: Secondary | ICD-10-CM | POA: Diagnosis not present

## 2024-07-06 DIAGNOSIS — K5900 Constipation, unspecified: Secondary | ICD-10-CM | POA: Diagnosis not present

## 2024-07-09 DIAGNOSIS — M549 Dorsalgia, unspecified: Secondary | ICD-10-CM | POA: Diagnosis not present

## 2024-07-10 DIAGNOSIS — N390 Urinary tract infection, site not specified: Secondary | ICD-10-CM | POA: Diagnosis not present

## 2024-07-13 DIAGNOSIS — R42 Dizziness and giddiness: Secondary | ICD-10-CM | POA: Diagnosis not present

## 2024-07-13 DIAGNOSIS — M549 Dorsalgia, unspecified: Secondary | ICD-10-CM | POA: Diagnosis not present

## 2024-07-15 DIAGNOSIS — Z96641 Presence of right artificial hip joint: Secondary | ICD-10-CM | POA: Diagnosis not present

## 2024-07-15 DIAGNOSIS — M25551 Pain in right hip: Secondary | ICD-10-CM | POA: Diagnosis not present

## 2024-07-16 DIAGNOSIS — M549 Dorsalgia, unspecified: Secondary | ICD-10-CM | POA: Diagnosis not present

## 2024-07-16 DIAGNOSIS — M25551 Pain in right hip: Secondary | ICD-10-CM | POA: Diagnosis not present

## 2024-07-17 DIAGNOSIS — R32 Unspecified urinary incontinence: Secondary | ICD-10-CM | POA: Diagnosis not present

## 2024-07-17 DIAGNOSIS — M810 Age-related osteoporosis without current pathological fracture: Secondary | ICD-10-CM | POA: Diagnosis not present

## 2024-07-17 DIAGNOSIS — K5900 Constipation, unspecified: Secondary | ICD-10-CM | POA: Diagnosis not present

## 2024-07-17 DIAGNOSIS — D631 Anemia in chronic kidney disease: Secondary | ICD-10-CM | POA: Diagnosis not present

## 2024-07-17 DIAGNOSIS — N1832 Chronic kidney disease, stage 3b: Secondary | ICD-10-CM | POA: Diagnosis not present

## 2024-07-17 DIAGNOSIS — Z130 Encounter for screening for diseases of the blood and blood-forming organs and certain disorders involving the immune mechanism: Secondary | ICD-10-CM | POA: Diagnosis not present

## 2024-07-17 DIAGNOSIS — I10 Essential (primary) hypertension: Secondary | ICD-10-CM | POA: Diagnosis not present

## 2024-07-17 DIAGNOSIS — R059 Cough, unspecified: Secondary | ICD-10-CM | POA: Diagnosis not present

## 2024-07-17 DIAGNOSIS — Z13228 Encounter for screening for other metabolic disorders: Secondary | ICD-10-CM | POA: Diagnosis not present

## 2024-07-23 DIAGNOSIS — I10 Essential (primary) hypertension: Secondary | ICD-10-CM | POA: Diagnosis not present

## 2024-07-24 DIAGNOSIS — I10 Essential (primary) hypertension: Secondary | ICD-10-CM | POA: Diagnosis not present

## 2024-07-31 DIAGNOSIS — I10 Essential (primary) hypertension: Secondary | ICD-10-CM | POA: Diagnosis not present

## 2024-08-04 DIAGNOSIS — M810 Age-related osteoporosis without current pathological fracture: Secondary | ICD-10-CM | POA: Diagnosis not present

## 2024-08-04 DIAGNOSIS — D631 Anemia in chronic kidney disease: Secondary | ICD-10-CM | POA: Diagnosis not present

## 2024-08-04 DIAGNOSIS — N1832 Chronic kidney disease, stage 3b: Secondary | ICD-10-CM | POA: Diagnosis not present

## 2024-08-04 DIAGNOSIS — R32 Unspecified urinary incontinence: Secondary | ICD-10-CM | POA: Diagnosis not present
